# Patient Record
Sex: Male | Born: 1935 | Race: Black or African American | Hispanic: No | Smoking: Never smoker
Health system: Southern US, Community
[De-identification: ages and names within clinical notes are randomized; demographics above are authoritative.]

## PROBLEM LIST (undated history)

## (undated) DIAGNOSIS — M1990 Unspecified osteoarthritis, unspecified site: Secondary | ICD-10-CM

## (undated) DIAGNOSIS — G450 Vertebro-basilar artery syndrome: Secondary | ICD-10-CM

## (undated) DIAGNOSIS — I639 Cerebral infarction, unspecified: Secondary | ICD-10-CM

## (undated) DIAGNOSIS — I472 Ventricular tachycardia: Secondary | ICD-10-CM

## (undated) DIAGNOSIS — R55 Syncope and collapse: Secondary | ICD-10-CM

## (undated) DIAGNOSIS — E785 Hyperlipidemia, unspecified: Secondary | ICD-10-CM

## (undated) DIAGNOSIS — I350 Nonrheumatic aortic (valve) stenosis: Secondary | ICD-10-CM

## (undated) DIAGNOSIS — I4729 Other ventricular tachycardia: Secondary | ICD-10-CM

## (undated) DIAGNOSIS — K529 Noninfective gastroenteritis and colitis, unspecified: Secondary | ICD-10-CM

## (undated) DIAGNOSIS — I610 Nontraumatic intracerebral hemorrhage in hemisphere, subcortical: Secondary | ICD-10-CM

## (undated) DIAGNOSIS — I82409 Acute embolism and thrombosis of unspecified deep veins of unspecified lower extremity: Secondary | ICD-10-CM

## (undated) HISTORY — PX: SHOULDER SURGERY: SHX246

## (undated) HISTORY — PX: LOOP RECORDER IMPLANT: SHX5954

## (undated) HISTORY — DX: Ventricular tachycardia: I47.2

## (undated) HISTORY — PX: HEMORRHOIDECTOMY WITH HEMORRHOID BANDING: SHX5633

## (undated) HISTORY — PX: OTHER SURGICAL HISTORY: SHX169

## (undated) HISTORY — PX: ABDOMINAL SURGERY: SHX537

---

## 2015-11-04 DIAGNOSIS — R55 Syncope and collapse: Secondary | ICD-10-CM | POA: Insufficient documentation

## 2017-08-31 DIAGNOSIS — I639 Cerebral infarction, unspecified: Secondary | ICD-10-CM | POA: Insufficient documentation

## 2018-12-06 DIAGNOSIS — R1013 Epigastric pain: Secondary | ICD-10-CM | POA: Insufficient documentation

## 2019-09-27 DIAGNOSIS — R001 Bradycardia, unspecified: Secondary | ICD-10-CM

## 2019-09-27 HISTORY — DX: Bradycardia, unspecified: R00.1

## 2020-01-10 DIAGNOSIS — Z8673 Personal history of transient ischemic attack (TIA), and cerebral infarction without residual deficits: Secondary | ICD-10-CM | POA: Insufficient documentation

## 2020-01-11 DIAGNOSIS — R7401 Elevation of levels of liver transaminase levels: Secondary | ICD-10-CM | POA: Insufficient documentation

## 2020-01-12 DIAGNOSIS — R29898 Other symptoms and signs involving the musculoskeletal system: Secondary | ICD-10-CM | POA: Insufficient documentation

## 2020-01-12 DIAGNOSIS — I610 Nontraumatic intracerebral hemorrhage in hemisphere, subcortical: Secondary | ICD-10-CM | POA: Insufficient documentation

## 2020-01-26 DIAGNOSIS — E271 Primary adrenocortical insufficiency: Secondary | ICD-10-CM | POA: Insufficient documentation

## 2020-01-26 DIAGNOSIS — R262 Difficulty in walking, not elsewhere classified: Secondary | ICD-10-CM | POA: Insufficient documentation

## 2020-02-18 DIAGNOSIS — I472 Ventricular tachycardia, unspecified: Secondary | ICD-10-CM | POA: Insufficient documentation

## 2020-05-19 ENCOUNTER — Emergency Department (HOSPITAL_COMMUNITY)
Admission: EM | Admit: 2020-05-19 | Discharge: 2020-05-19 | Disposition: A | Discharge status: Home / Self Care | Payer: Medicare Other | Attending: Emergency Medicine | Admitting: Emergency Medicine

## 2020-05-19 ENCOUNTER — Other Ambulatory Visit: Payer: Self-pay

## 2020-05-19 ENCOUNTER — Emergency Department (HOSPITAL_COMMUNITY): Payer: Medicare Other

## 2020-05-19 ENCOUNTER — Encounter (HOSPITAL_COMMUNITY): Payer: Self-pay

## 2020-05-19 DIAGNOSIS — K529 Noninfective gastroenteritis and colitis, unspecified: Secondary | ICD-10-CM

## 2020-05-19 DIAGNOSIS — R7989 Other specified abnormal findings of blood chemistry: Secondary | ICD-10-CM

## 2020-05-19 DIAGNOSIS — IMO0001 Reserved for inherently not codable concepts without codable children: Secondary | ICD-10-CM

## 2020-05-19 DIAGNOSIS — K6289 Other specified diseases of anus and rectum: Secondary | ICD-10-CM | POA: Diagnosis not present

## 2020-05-19 DIAGNOSIS — R102 Pelvic and perineal pain: Secondary | ICD-10-CM | POA: Diagnosis present

## 2020-05-19 HISTORY — DX: Unspecified osteoarthritis, unspecified site: M19.90

## 2020-05-19 LAB — COMPREHENSIVE METABOLIC PANEL
ALT: 54 U/L — ABNORMAL HIGH (ref 0–44)
AST: 46 U/L — ABNORMAL HIGH (ref 15–41)
Albumin: 4.1 g/dL (ref 3.5–5.0)
Alkaline Phosphatase: 145 U/L — ABNORMAL HIGH (ref 38–126)
Anion gap: 12 (ref 5–15)
BUN: 19 mg/dL (ref 8–23)
CO2: 23 mmol/L (ref 22–32)
Calcium: 9.3 mg/dL (ref 8.9–10.3)
Chloride: 104 mmol/L (ref 98–111)
Creatinine, Ser: 0.97 mg/dL (ref 0.61–1.24)
GFR calc Af Amer: >60 mL/min (ref >60)
GFR calc non Af Amer: >60 mL/min (ref >60)
Glucose, Bld: 93 mg/dL (ref 70–99)
Potassium: 4 mmol/L (ref 3.5–5.1)
Sodium: 139 mmol/L (ref 135–145)
Total Bilirubin: 0.8 mg/dL (ref 0.3–1.2)
Total Protein: 7.9 g/dL (ref 6.5–8.1)

## 2020-05-19 LAB — CBC WITH DIFFERENTIAL/PLATELET
Abs Immature Granulocytes: 0.02 10*3/uL (ref 0.00–0.07)
Basophils Absolute: 0.1 10*3/uL (ref 0.0–0.1)
Basophils Relative: 1 %
Eosinophils Absolute: 0.2 10*3/uL (ref 0.0–0.5)
Eosinophils Relative: 2 %
HCT: 46 % (ref 39.0–52.0)
Hemoglobin: 14.6 g/dL (ref 13.0–17.0)
Immature Granulocytes: 0 %
Lymphocytes Relative: 17 %
Lymphs Abs: 1.6 10*3/uL (ref 0.7–4.0)
MCH: 29.8 pg (ref 26.0–34.0)
MCHC: 31.7 g/dL (ref 30.0–36.0)
MCV: 93.9 fL (ref 80.0–100.0)
Monocytes Absolute: 0.8 10*3/uL (ref 0.1–1.0)
Monocytes Relative: 9 %
Neutro Abs: 6.9 10*3/uL (ref 1.7–7.7)
Neutrophils Relative %: 71 %
Platelets: 128 10*3/uL — ABNORMAL LOW (ref 150–400)
RBC: 4.9 MIL/uL (ref 4.22–5.81)
RDW: 14.6 % (ref 11.5–15.5)
WBC: 9.6 10*3/uL (ref 4.0–10.5)
nRBC: 0 % (ref 0.0–0.2)

## 2020-05-19 LAB — LIPASE, BLOOD: Lipase: 67 U/L — ABNORMAL HIGH (ref 11–51)

## 2020-05-19 MED ORDER — AMOXICILLIN-POT CLAVULANATE 875-125 MG PO TABS: 1.0000 | ORAL_TABLET | Freq: Two times a day (BID) | ORAL | 0 refills | Status: AC

## 2020-05-19 MED ORDER — IOHEXOL 300 MG/ML  SOLN
100.0000 mL | Freq: Once | INTRAMUSCULAR | Status: AC | PRN
  Administered 2020-05-19: 100 mL via INTRAVENOUS

## 2020-05-19 NOTE — ED Provider Notes (Signed)
[facility] EMERGENCY DEPARTMENT Provider Note   CSN: [id number] Arrival date & time: 05/19/20  8338     History Chief Complaint  Patient presents with  . Rectal Pain    Charles Morgan is a 86 y.o. male.  Patient with a complaint of rectal pain since yesterday.  Patient states she had some hard bowel movements.  He thinks this is hemorrhoids.  Patient had banding of hemorrhoids in the past from reviewing records.  Appears that patient use does not live in this area.  Not followed by gastroenterology here.  Patient talks about some pelvic pain as well.  But no nausea or vomiting.  Denies any bleeding.  But he said there was like 2 large lumps he thought.        Past Medical History:  Diagnosis Date  . Arthritis     There are no problems to display for this patient.   Past Surgical History:  Procedure Laterality Date  . ABDOMINAL SURGERY    . HEMORRHOIDECTOMY WITH HEMORRHOID BANDING    . LOOP RECORDER IMPLANT    . SHOULDER SURGERY    . thumb surgery         No family history on file.  Social History   Tobacco Use  . Smoking status: Never Smoker  . Smokeless tobacco: Never Used  Substance Use Topics  . Alcohol use: Never  . Drug use: Never    Home Medications Prior to Admission medications   Not on File    Allergies    Patient has no known allergies.  Review of Systems   Review of Systems  Constitutional: Negative for chills and fever.  HENT: Negative for congestion, rhinorrhea and sore throat.   Eyes: Negative for visual disturbance.  Respiratory: Negative for cough and shortness of breath.   Cardiovascular: Negative for chest pain and leg swelling.  Gastrointestinal: Positive for abdominal pain and rectal pain. Negative for blood in stool, diarrhea, nausea and vomiting.  Genitourinary: Negative for dysuria.  Musculoskeletal: Negative for back pain and neck pain.  Skin: Negative for rash.  Neurological: Negative for dizziness, light-headedness and  headaches.  Hematological: Does not bruise/bleed easily.  Psychiatric/Behavioral: Negative for confusion.    Physical Exam Updated Vital Signs BP 130/75 (BP Location: Left Arm)   Pulse 70   Temp 98.6 F (37 C) (Oral)   Resp 18   Ht 1.702 m (5\' 7" )   Wt 65.3 kg   SpO2 100%   BMI 22.55 kg/m   Physical Exam Vitals and nursing note reviewed.  Constitutional:      General: He is not in acute distress.    Appearance: Normal appearance. He is well-developed. He is not ill-appearing.  HENT:     Head: Normocephalic and atraumatic.  Eyes:     Extraocular Movements: Extraocular movements intact.     Conjunctiva/sclera: Conjunctivae normal.     Pupils: Pupils are equal, round, and reactive to light.  Cardiovascular:     Rate and Rhythm: Normal rate and regular rhythm.     Heart sounds: No murmur heard.   Pulmonary:     Effort: Pulmonary effort is normal. No respiratory distress.     Breath sounds: Normal breath sounds.  Abdominal:     Palpations: Abdomen is soft.     Tenderness: There is no abdominal tenderness.  Genitourinary:    Rectum: Guaiac result negative.     Comments: Tenderness to palpation around the perirectal area.  But no induration.  Some old skin  tags.  No thrombosed hemorrhoids.  No prolapsed internal hemorrhoids.  No stool in the rectal vault.  Disorder of a lot of gas.  Not think it is purulent but not certain.  No fissure.  Some degree of tenderness on rectal exam but no palpable mass. Musculoskeletal:        General: Normal range of motion.     Cervical back: Normal range of motion and neck supple.  Skin:    General: Skin is warm and dry.     Capillary Refill: Capillary refill takes less than 2 seconds.  Neurological:     General: No focal deficit present.     Mental Status: He is alert and oriented to person, place, and time.     Cranial Nerves: No cranial nerve deficit.     Sensory: No sensory deficit.     Motor: No weakness.     ED Results /  Procedures / Treatments   Labs (all labs ordered are listed, but only abnormal results are displayed) Labs Reviewed  COMPREHENSIVE METABOLIC PANEL  LIPASE, BLOOD  CBC WITH DIFFERENTIAL/PLATELET    EKG None  Radiology No results found.  Procedures Procedures (including critical care time)  Medications Ordered in ED Medications - No data to display  ED Course  I have reviewed the triage vital signs and the nursing notes.  Pertinent labs & imaging results that were available during my care of the patient were reviewed by me and considered in my medical decision making (see chart for details).    MDM Rules/Calculators/A&P                          Rectal exam without any specific findings.  Raising some concerns of whether there is may be a deeper perirectal abscess.  No evidence of any prolapsed internal hemorrhoids or evidence of any thrombosed external hemorrhoids.  Also at his age always could be concern for rectal carcinoma.  I will go ahead and get labs and get CT abdomen pelvis.  If no specific findings would have patient follow-up locally with GI medicine.     Final Clinical Impression(s) / ED Diagnoses Final diagnoses:  Rectal pain    Rx / DC Orders ED Discharge Orders    None       Vanetta Mulders, MD 05/19/20 1304

## 2020-05-19 NOTE — ED Provider Notes (Signed)
Blood pressure 130/75, pulse 70, temperature 98.6 F (37 C), temperature source Oral, resp. rate 18, height 5\' 7"  (1.702 m), weight 65.3 kg, SpO2 100 %.  Assuming care from Dr.  In short, Charles Morgan is a 86 y.o. male with a chief complaint of Rectal Pain .  Refer to the original H&P for additional details.  The current plan of care is to f/u on CT abdomen/pelvis and reassess. Labs reviewed and largely unremarkable.   04:05 PM  CT imaging reviewed along with interpretation by radiology.  Patient has evidence of mild proctocolitis on his CT which correlates with his symptoms.  Plan for antibiotics and GI follow-up.  The area noted over the buttock is not causing pain and is not consistent with abscess.  Patient also with dilated common bile duct but no anterior abdominal pain.  He has very mild elevation in his LFTs and per radiology recommendation will mention this to him and have him also discussed with gastroenterology regarding outpatient MRCP PRN.  Patient also has a pulmonary nodule found on CT.  Will mention this and he will follow with his primary care doctor to decide on the need for further or follow-up imaging. Discussed ED results, plan, and return precautions in detail. Patient feels comfortable with the plan at discharge.     97, MD 05/19/20 [phone]

## 2020-05-19 NOTE — Discharge Instructions (Signed)
You were seen in the emergency department today with rectal pain.  You do have some inflammation in your rectum and colon area and I am starting you on antibiotics.  Please take the antibiotics over the next 7 days.  I have also listed the name of a gastroenterologist on this paperwork.  I would like for you to call their office and schedule the next available follow-up appointment.  They need to evaluate your rectal pain and decide on further imaging/procedures as needed such as colonoscopy.  You also have slightly elevated liver enzymes and a slightly dilated common bile duct.  This may require further testing such as an MRCP and they can discuss this with you further.    You also have a small nodule in your lung.  Please discuss with your primary care doctor to decide on further testing or follow-up imaging.   If you develop fever, worsening pain, vomiting, pain in the front of your abdomen, or worsening pain with eating you should return to the emergency department immediately.

## 2020-05-19 NOTE — ED Triage Notes (Signed)
Pt reports pain from hemorrhoids since yesterday.  Reports has had hemorrhoidectomy x 2 in the past.  Reports stool has been hard recently and has had pain since yesterday.

## 2020-06-04 ENCOUNTER — Observation Stay (HOSPITAL_COMMUNITY): Payer: Medicare HMO

## 2020-06-04 ENCOUNTER — Other Ambulatory Visit: Payer: Self-pay

## 2020-06-04 ENCOUNTER — Emergency Department (HOSPITAL_COMMUNITY): Payer: Medicare HMO

## 2020-06-04 ENCOUNTER — Encounter (HOSPITAL_COMMUNITY): Payer: Self-pay

## 2020-06-04 ENCOUNTER — Observation Stay (HOSPITAL_COMMUNITY)
Admission: EM | Admit: 2020-06-04 | Discharge: 2020-06-06 | Disposition: A | Discharge status: Home / Self Care | Payer: Medicare HMO | Attending: Internal Medicine | Admitting: Internal Medicine

## 2020-06-04 DIAGNOSIS — Z7982 Long term (current) use of aspirin: Secondary | ICD-10-CM | POA: Insufficient documentation

## 2020-06-04 DIAGNOSIS — I359 Nonrheumatic aortic valve disorder, unspecified: Secondary | ICD-10-CM | POA: Diagnosis present

## 2020-06-04 DIAGNOSIS — R933 Abnormal findings on diagnostic imaging of other parts of digestive tract: Secondary | ICD-10-CM | POA: Diagnosis not present

## 2020-06-04 DIAGNOSIS — I651 Occlusion and stenosis of basilar artery: Secondary | ICD-10-CM

## 2020-06-04 DIAGNOSIS — K648 Other hemorrhoids: Secondary | ICD-10-CM | POA: Diagnosis not present

## 2020-06-04 DIAGNOSIS — R2689 Other abnormalities of gait and mobility: Secondary | ICD-10-CM | POA: Insufficient documentation

## 2020-06-04 DIAGNOSIS — R2681 Unsteadiness on feet: Secondary | ICD-10-CM | POA: Insufficient documentation

## 2020-06-04 DIAGNOSIS — R7989 Other specified abnormal findings of blood chemistry: Secondary | ICD-10-CM

## 2020-06-04 DIAGNOSIS — R55 Syncope and collapse: Secondary | ICD-10-CM | POA: Diagnosis present

## 2020-06-04 DIAGNOSIS — D696 Thrombocytopenia, unspecified: Secondary | ICD-10-CM

## 2020-06-04 DIAGNOSIS — Z20822 Contact with and (suspected) exposure to covid-19: Secondary | ICD-10-CM | POA: Insufficient documentation

## 2020-06-04 DIAGNOSIS — R109 Unspecified abdominal pain: Secondary | ICD-10-CM

## 2020-06-04 DIAGNOSIS — N179 Acute kidney failure, unspecified: Secondary | ICD-10-CM | POA: Diagnosis not present

## 2020-06-04 DIAGNOSIS — Z79899 Other long term (current) drug therapy: Secondary | ICD-10-CM | POA: Insufficient documentation

## 2020-06-04 DIAGNOSIS — Z8673 Personal history of transient ischemic attack (TIA), and cerebral infarction without residual deficits: Secondary | ICD-10-CM | POA: Diagnosis not present

## 2020-06-04 DIAGNOSIS — E785 Hyperlipidemia, unspecified: Secondary | ICD-10-CM | POA: Diagnosis not present

## 2020-06-04 DIAGNOSIS — N289 Disorder of kidney and ureter, unspecified: Secondary | ICD-10-CM

## 2020-06-04 DIAGNOSIS — M6281 Muscle weakness (generalized): Secondary | ICD-10-CM | POA: Diagnosis not present

## 2020-06-04 DIAGNOSIS — K5730 Diverticulosis of large intestine without perforation or abscess without bleeding: Principal | ICD-10-CM | POA: Insufficient documentation

## 2020-06-04 DIAGNOSIS — E860 Dehydration: Secondary | ICD-10-CM | POA: Diagnosis not present

## 2020-06-04 DIAGNOSIS — G459 Transient cerebral ischemic attack, unspecified: Secondary | ICD-10-CM

## 2020-06-04 DIAGNOSIS — N183 Chronic kidney disease, stage 3 unspecified: Secondary | ICD-10-CM | POA: Diagnosis not present

## 2020-06-04 DIAGNOSIS — R7401 Elevation of levels of liver transaminase levels: Secondary | ICD-10-CM

## 2020-06-04 HISTORY — DX: Syncope and collapse: R55

## 2020-06-04 HISTORY — DX: Hyperlipidemia, unspecified: E78.5

## 2020-06-04 HISTORY — DX: Vertebro-basilar artery syndrome: G45.0

## 2020-06-04 HISTORY — DX: Nontraumatic intracerebral hemorrhage in hemisphere, subcortical: I61.0

## 2020-06-04 HISTORY — DX: Cerebral infarction, unspecified: I63.9

## 2020-06-04 HISTORY — DX: Other ventricular tachycardia: I47.29

## 2020-06-04 HISTORY — DX: Acute embolism and thrombosis of unspecified deep veins of unspecified lower extremity: I82.409

## 2020-06-04 HISTORY — DX: Noninfective gastroenteritis and colitis, unspecified: K52.9

## 2020-06-04 HISTORY — DX: Nonrheumatic aortic (valve) stenosis: I35.0

## 2020-06-04 LAB — COMPREHENSIVE METABOLIC PANEL
ALT: 308 U/L — ABNORMAL HIGH (ref 0–44)
AST: 269 U/L — ABNORMAL HIGH (ref 15–41)
Albumin: 3.5 g/dL (ref 3.5–5.0)
Alkaline Phosphatase: 479 U/L — ABNORMAL HIGH (ref 38–126)
Anion gap: 7 (ref 5–15)
BUN: 16 mg/dL (ref 8–23)
CO2: 25 mmol/L (ref 22–32)
Calcium: 9.1 mg/dL (ref 8.9–10.3)
Chloride: 106 mmol/L (ref 98–111)
Creatinine, Ser: 1.28 mg/dL — ABNORMAL HIGH (ref 0.61–1.24)
GFR calc Af Amer: 58 mL/min — ABNORMAL LOW (ref >60)
GFR calc non Af Amer: 50 mL/min — ABNORMAL LOW (ref >60)
Glucose, Bld: 127 mg/dL — ABNORMAL HIGH (ref 70–99)
Potassium: 4.1 mmol/L (ref 3.5–5.1)
Sodium: 138 mmol/L (ref 135–145)
Total Bilirubin: 0.8 mg/dL (ref 0.3–1.2)
Total Protein: 7.1 g/dL (ref 6.5–8.1)

## 2020-06-04 LAB — DIFFERENTIAL
Abs Immature Granulocytes: 0.02 10*3/uL (ref 0.00–0.07)
Basophils Absolute: 0.1 10*3/uL (ref 0.0–0.1)
Basophils Relative: 1 %
Eosinophils Absolute: 0.4 10*3/uL (ref 0.0–0.5)
Eosinophils Relative: 6 %
Immature Granulocytes: 0 %
Lymphocytes Relative: 21 %
Lymphs Abs: 1.5 10*3/uL (ref 0.7–4.0)
Monocytes Absolute: 0.6 10*3/uL (ref 0.1–1.0)
Monocytes Relative: 8 %
Neutro Abs: 4.3 10*3/uL (ref 1.7–7.7)
Neutrophils Relative %: 64 %

## 2020-06-04 LAB — URINALYSIS, ROUTINE W REFLEX MICROSCOPIC
Bilirubin Urine: NEGATIVE
Glucose, UA: NEGATIVE mg/dL
Hgb urine dipstick: NEGATIVE
Ketones, ur: NEGATIVE mg/dL
Leukocytes,Ua: NEGATIVE
Nitrite: NEGATIVE
Protein, ur: NEGATIVE mg/dL
Specific Gravity, Urine: 1.01 (ref 1.005–1.030)
pH: 5 (ref 5.0–8.0)

## 2020-06-04 LAB — RAPID URINE DRUG SCREEN, HOSP PERFORMED
Amphetamines: NOT DETECTED
Barbiturates: NOT DETECTED
Benzodiazepines: NOT DETECTED
Cocaine: NOT DETECTED
Opiates: NOT DETECTED
Tetrahydrocannabinol: NOT DETECTED

## 2020-06-04 LAB — I-STAT CHEM 8, ED
BUN: 19 mg/dL (ref 8–23)
Calcium, Ion: 1.1 mmol/L — ABNORMAL LOW (ref 1.15–1.40)
Chloride: 107 mmol/L (ref 98–111)
Creatinine, Ser: 1.5 mg/dL — ABNORMAL HIGH (ref 0.61–1.24)
Glucose, Bld: 131 mg/dL — ABNORMAL HIGH (ref 70–99)
HCT: 43 % (ref 39.0–52.0)
Hemoglobin: 14.6 g/dL (ref 13.0–17.0)
Potassium: 5 mmol/L (ref 3.5–5.1)
Sodium: 140 mmol/L (ref 135–145)
TCO2: 24 mmol/L (ref 22–32)

## 2020-06-04 LAB — ETHANOL: Alcohol, Ethyl (B): <10 mg/dL (ref <10)

## 2020-06-04 LAB — APTT: aPTT: 25 seconds (ref 24–36)

## 2020-06-04 LAB — CBC
HCT: 43.9 % (ref 39.0–52.0)
Hemoglobin: 14.1 g/dL (ref 13.0–17.0)
MCH: 29.9 pg (ref 26.0–34.0)
MCHC: 32.1 g/dL (ref 30.0–36.0)
MCV: 93 fL (ref 80.0–100.0)
Platelets: 126 10*3/uL — ABNORMAL LOW (ref 150–400)
RBC: 4.72 MIL/uL (ref 4.22–5.81)
RDW: 14.6 % (ref 11.5–15.5)
WBC: 6.8 10*3/uL (ref 4.0–10.5)
nRBC: 0 % (ref 0.0–0.2)

## 2020-06-04 LAB — HEMOGLOBIN A1C
Hgb A1c MFr Bld: 6.2 % — ABNORMAL HIGH (ref 4.8–5.6)
Mean Plasma Glucose: 131.24 mg/dL

## 2020-06-04 LAB — MAGNESIUM: Magnesium: 2 mg/dL (ref 1.7–2.4)

## 2020-06-04 LAB — SALICYLATE LEVEL: Salicylate Lvl: <7 mg/dL — ABNORMAL LOW (ref 7.0–30.0)

## 2020-06-04 LAB — ACETAMINOPHEN LEVEL: Acetaminophen (Tylenol), Serum: <10 ug/mL — ABNORMAL LOW (ref 10–30)

## 2020-06-04 LAB — CK: Total CK: 171 U/L (ref 49–397)

## 2020-06-04 LAB — SARS CORONAVIRUS 2 BY RT PCR (HOSPITAL ORDER, PERFORMED IN ~~LOC~~ HOSPITAL LAB): SARS Coronavirus 2: NEGATIVE

## 2020-06-04 LAB — PROTIME-INR
INR: 1 (ref 0.8–1.2)
Prothrombin Time: 12.8 seconds (ref 11.4–15.2)

## 2020-06-04 MED ORDER — ASPIRIN EC 81 MG PO TBEC
81.0000 mg | DELAYED_RELEASE_TABLET | Freq: Every day | ORAL | Status: DC
  Administered 2020-06-04: 81 mg via ORAL
  Filled 2020-06-04: qty 1

## 2020-06-04 MED ORDER — SODIUM CHLORIDE 0.9 % IV BOLUS
500.0000 mL | Freq: Once | INTRAVENOUS | Status: AC
  Administered 2020-06-04: 500 mL via INTRAVENOUS

## 2020-06-04 MED ORDER — MIDODRINE HCL 5 MG PO TABS
5.0000 mg | ORAL_TABLET | Freq: Two times a day (BID) | ORAL | Status: DC
  Administered 2020-06-04 – 2020-06-06 (×4): 5 mg via ORAL
  Filled 2020-06-04 (×5): qty 1

## 2020-06-04 MED ORDER — DOCUSATE SODIUM 100 MG PO CAPS
100.0000 mg | ORAL_CAPSULE | Freq: Two times a day (BID) | ORAL | Status: DC
  Administered 2020-06-04 – 2020-06-06 (×3): 100 mg via ORAL
  Filled 2020-06-04 (×4): qty 1

## 2020-06-04 MED ORDER — PANTOPRAZOLE SODIUM 40 MG PO TBEC
40.0000 mg | DELAYED_RELEASE_TABLET | Freq: Every day | ORAL | Status: DC
  Administered 2020-06-04 – 2020-06-06 (×3): 40 mg via ORAL
  Filled 2020-06-04 (×3): qty 1

## 2020-06-04 MED ORDER — SODIUM CHLORIDE 0.9% FLUSH
3.0000 mL | Freq: Two times a day (BID) | INTRAVENOUS | Status: DC
  Administered 2020-06-05 – 2020-06-06 (×2): 3 mL via INTRAVENOUS

## 2020-06-04 MED ORDER — SODIUM CHLORIDE 0.45 % IV SOLN: INTRAVENOUS | Status: DC

## 2020-06-04 MED ORDER — ATORVASTATIN CALCIUM 40 MG PO TABS
80.0000 mg | ORAL_TABLET | Freq: Every day | ORAL | Status: DC
  Administered 2020-06-04: 80 mg via ORAL
  Filled 2020-06-04: qty 2

## 2020-06-04 MED ORDER — ACETAMINOPHEN 325 MG PO TABS: 650.0000 mg | ORAL_TABLET | Freq: Four times a day (QID) | ORAL | Status: DC | PRN

## 2020-06-04 MED ORDER — ENOXAPARIN SODIUM 40 MG/0.4ML ~~LOC~~ SOLN
40.0000 mg | SUBCUTANEOUS | Status: DC
  Administered 2020-06-04 – 2020-06-05 (×2): 40 mg via SUBCUTANEOUS
  Filled 2020-06-04 (×2): qty 0.4

## 2020-06-04 MED ORDER — ACETAMINOPHEN 650 MG RE SUPP: 650.0000 mg | Freq: Four times a day (QID) | RECTAL | Status: DC | PRN

## 2020-06-04 NOTE — ED Notes (Signed)
Pt verbalized taking 2 covid vaccines

## 2020-06-04 NOTE — H&P (Signed)
History and Physical    Kohler Pellerito BWG:[id number] DOB: May 14, 1930 DOA: 06/04/2020  PCP: Patient, No Pcp Per (Confirm with patient/family/NH records and if not entered, this has to be entered at [facility] point of entry) Patient coming from: home  I have personally briefly reviewed patient's old medical records in [facility]  Chief Complaint: loss of consciousness  HPI: Charles Morgan is a 86 y.o. male with medical history significant of vertebrobasilar insufficiency, multiple episodes of syncope, h/o CVAs, AoV Calcification/stenosis, hyperlipidemia. Patient lives in [place]. Was able to access and review [facility] records, [place]. Patient has had increasing debility with gait disturbance and need for assistance with ADLs to the point where his long estranged daughter has reengaged and has become his primary care giver. They are in [place] visiting family with plans to move here from [place]. On the day of admission his daughter found him unconscious and unresponsive for several minutes until EMS arrived. He was transported to [facility] for further evaluation  ED Course: T 97.5  137/68  62  16. Patient was responsive and had no complaint. Exam per EDP was non-focal. Lab revealed mild elevation Cr to 1.4 which improved with IVF to 1.2, mild elevation in LFTs. CT head negative for any acute changes. [facility] called to admit patient for observation and continued evaluation.  Review of Systems: As per HPI otherwise 10 point review of systems negative.    Past Medical History:  Diagnosis Date  . Arthritis   . Bradycardia, sinus 2021   nocturnal, asymptomatic. Had cardiology eval  . CVA (cerebral vascular accident) [facility])    two events: 2013, 2019  . DVT (deep venous thrombosis) (HCC) Aug '20   right LE  . HLD (hyperlipidemia)   . Proctocolitis without complication Aug '21   CT diagnosis  . Syncope and collapse    multiple events. Neuro eval - no w/u needed w/ dx VB Insufficiency  . 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 Neurology eval - no need for further w/u  . V-tach [facility])    multiple episodes non-sustained V tach. Had Cardiology eval  . Vertebrobasilar artery insufficiency     Past Surgical History:  Procedure Laterality Date  . ABDOMINAL SURGERY    . HEMORRHOIDECTOMY WITH HEMORRHOID BANDING    . LOOP RECORDER IMPLANT    . SHOULDER SURGERY    . 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 Married twice: #1 16 years - divorced, #2 still married but seperated for many years. He had 7 children, 6 granchildren, 13 greatgrand children, 2 great-greatgrand children. 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 Retired due to progress OA and decreased use of his hands. Is reuniting with his family.   reports that he has never smoked. He has never used smokeless tobacco. He reports that he does not drink alcohol and does not use drugs.  Allergies  Allergen Reactions  . Tuberculin Rash    No family history on file. At 89 - noncontributory   Prior to Admission medications   Medication Sig Start Date End Date Taking? Authorizing Provider  aspirin EC 81 MG tablet Take 81 mg by mouth daily. Swallow whole.    [provider]  atorvastatin (LIPITOR) 80 MG tablet Take 1 tablet by mouth in the morning and at bedtime.  01/08/20   [provider]  docusate sodium (COLACE) 100 MG capsule Take 100 mg by mouth 2 (two) times daily.    [provider]    Physical Exam: Vitals:   06/04/20 1245 06/04/20 1300 06/04/20 1315 06/04/20 1330  BP:  137/68    Pulse: (!) 55 (!) 52 (!) 52 62  Resp: (!) 22 (!) 23 18 16   Temp:      TempSrc:      SpO2: 99% 100% 100% 100%  Weight:      Height:         Vitals:   06/04/20 1245 06/04/20 1300 06/04/20 1315 06/04/20 1330  BP:  137/68    Pulse: (!)  55 (!) 52 (!) 52 62  Resp: (!) 22 (!) 23 18 16   Temp:      TempSrc:      SpO2: 99% 100% 100% 100%  Weight:      Height:       General: elderly man in no distress. Cooperative and articulate. Eyes: Arcus senilis bilaterally, PERRL, lids and conjunctivae normal ENMT: Mucous membranes are moist. Posterior pharynx clear of any exudate or lesions. Edentulous with two remaining teeth mandible.  Neck: normal, supple, no masses, no thyromegaly Respiratory: clear to auscultation bilaterally, no wheezing, no crackles. Normal respiratory effort. No accessory muscle use.  Cardiovascular: Regular bradycardia with bigeminy and trigeminy, II/VI murmur best at LSB. No rubs / gallops. No extremity edema. 1+ pedal pulses. No carotid bruits.  Abdomen:  Protuberant,  no tenderness to percussion or deep palpation, no masses palpated. No hepatosplenomegaly. Bowel sounds positive.  Musculoskeletal: no clubbing / cyanosis. No erythema or calore joints of hands, knees. Has hammer toe deformity 2nd toe left with tenderness.  Good ROM, no contractures. Decreased muscle tone.  Skin: no rashes, lesions, ulcers. No induration. Mild excoriation diffusely 2/2 dry skin Neurologic: CN 2-12 nl facial symmetry and movement, PERRLA, EOMI, no deviation of the tongue, nl shoulder shrug. Sensation intact to light touch. Strength 4/5 in all 4 able to move against gravity, offers some resistance.  Psychiatric: Normal judgment and insight. Alert and oriented x 3. Normal mood.     Labs on Admission: I have personally reviewed following labs and imaging studies  CBC: Recent Labs  Lab 06/04/20 1248 06/04/20 1257  WBC  --  6.8  NEUTROABS  --  4.3  HGB 14.6 14.1  HCT 43.0 43.9  MCV  --  93.0  PLT  --  126*   Basic Metabolic Panel: Recent Labs  Lab 06/04/20 1248 06/04/20 1257  NA 140 138  K 5.0 4.1  CL 107 106  CO2  --  25  GLUCOSE 131* 127*  BUN 19 16  CREATININE 1.50* 1.28*  CALCIUM  --  9.1  MG  --  2.0    GFR: Estimated Creatinine Clearance: 37.6 mL/min (A) (by C-G formula based on SCr of 1.28 mg/dL (H)). Liver Function Tests: Recent Labs  Lab 06/04/20 1257  AST 269*  ALT 308*  ALKPHOS 479*  BILITOT 0.8  PROT 7.1  ALBUMIN 3.5   No results for input(s): LIPASE, AMYLASE in the last 168 hours. No results for input(s): AMMONIA in the last 168 hours. Coagulation Profile: Recent Labs  Lab 06/04/20 1257  INR 1.0   Cardiac Enzymes: Recent Labs  Lab 06/04/20 1257  CKTOTAL 171   BNP (last 3 results) No results for input(s): PROBNP in the last 8760 hours. HbA1C: No results for input(s): HGBA1C in the  last 72 hours. CBG: No results for input(s): GLUCAP in the last 168 hours. Lipid Profile: No results for input(s): CHOL, HDL, LDLCALC, TRIG, CHOLHDL, LDLDIRECT in the last 72 hours. Thyroid Function Tests: No results for input(s): TSH, T4TOTAL, FREET4, T3FREE, THYROIDAB in the last 72 hours. Anemia Panel: No results for input(s): VITAMINB12, FOLATE, FERRITIN, TIBC, IRON, RETICCTPCT in the last 72 hours. Urine analysis:    Component Value Date/Time   COLORURINE YELLOW 06/04/2020 1436   APPEARANCEUR HAZY (A) 06/04/2020 1436   LABSPEC 1.010 06/04/2020 1436   PHURINE 5.0 06/04/2020 1436   GLUCOSEU NEGATIVE 06/04/2020 1436   HGBUR NEGATIVE 06/04/2020 1436   BILIRUBINUR NEGATIVE 06/04/2020 1436   KETONESUR NEGATIVE 06/04/2020 1436   PROTEINUR NEGATIVE 06/04/2020 1436   NITRITE NEGATIVE 06/04/2020 1436   LEUKOCYTESUR NEGATIVE 06/04/2020 1436    Radiological Exams on Admission: CT HEAD WO CONTRAST  Result Date: 06/04/2020 CLINICAL DATA:  TIA symptoms with weakness EXAM: CT HEAD WITHOUT CONTRAST TECHNIQUE: Contiguous axial images were obtained from the base of the skull through the vertex without intravenous contrast. COMPARISON:  None FINDINGS: Brain: There is moderate diffuse atrophy, stable. There is no intracranial mass, hemorrhage, extra-axial fluid collection, or midline  shift. There is patchy small vessel disease in the centra semiovale bilaterally. There is evidence of a prior infarct in the posterior mid left cerebellum. There is a second prior infarct laterally in the mid to upper right cerebellum. Small vessel disease is noted in each lateral thalamus. No acute infarct is evident on this study. Vascular: No hyperdense vessel. There is calcification in each carotid siphon region. Skull: The bony calvarium appears intact. Sinuses/Orbits: There is mucosal thickening involving several ethmoid air cells. Other visualized paranasal sinuses are clear. Visualized orbits appear symmetric bilaterally. Other: There is opacification in several mastoid air cells bilaterally. There is debris in the left external auditory canal. IMPRESSION: Moderate atrophy with supratentorial small vessel disease. Prior infarcts in each cerebellum, chronic. No acute infarct demonstrable. No mass or hemorrhage. There are foci of arterial vascular calcification bilaterally. Mucosal thickening noted in several ethmoid air cells. Opacification in several mastoid air cells bilaterally. Probable cerumen in left external auditory canal. Electronically Signed   By: Bretta BangWilliam  Woodruff III M.D.   On: 06/04/2020 13:16    EKG: Independently reviewed. Sinus bradycardia, T wave inversion Leads V3-V5  Assessment/Plan Active Problems:   Syncope   Dehydration   AKI (acute kidney injury) (HCC)   HLD (hyperlipidemia)   Calcification of aortic valve   Syncope and collapse  (please populate well all problems here in Problem List. (For example, if patient is on BP meds at home and you resume or decide to hold them, it is a problem that needs to be her. Same for CAD, COPD, HLD and so on)   1. Syncope - patient has had many episodes of syncope and several hospitalizations in the last 6 months with evaluations by cardiology and neurology. Findings included nocturnal bradycardia which cardiology did not think was  causative. Old, chronic thalamic hemorrhage on MRI which did not require further workup. He is currently wearing a loop recorder placed by cardiology in [place] as part of continued syncope work up. He has had MRI. He has had 2D echo January 07, 2020 notable for nl EF, mild AoV stenosis 2/2 calcification. 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 He had previously been prescribed Midodrine 5 mg bid.  Plan Telemetry  Observation  Resume midorine 5 mg bid  2. Dehydration with  mild AKI- patient with initial Cr 1.4 which with hydration improved to 1.2 Plan IVF 1/2 NS at 50cc/hr x 12hrs  F/u Bmet3.   3. HLD - continue storvastatin  4. Neuro - h/o CVA x 2, 2013, 2019. He at one time had dysarthria and gait instability, chronic facial droop. Plan Continue ASA  PT/OT eval  5. GI - h/o heavy EtOH use but quit 3 years ago. Has had elevated transaminases at previous hospitalization. Imaging studies at outside hospital in [place] did revealed dilated CBD and GI f/u and possible MRCP was recommended but hasn't happened. He reports exploratory laparotomy in the past for GB disease which was unrevealing. Denies any h/o pancreatitis. Has had no jaundice, no weight loss. Plan PPI  GI follow up as outpatient  6. Cardiology - see #1. Patient does have bradycardia with frequent pauses. He is wearing a loop recorder.  Plan Tele obs  Outpatient f/u with cardiology  7. H/o DVT - had Right LE DVT in August 2020. Was to have completed 90 days of Xarelto. No leg pain or swelling. No respiratory symptoms Plan Continue ASA  8. Elevated serum glucose - patient denies any h/o diabetes. Plan A1C with recommendations to follow  9. Code status - verified DNR status with patient. He has a DNR in [place]  10. Disposition - his daughter has been primary care given and went to [place] to care for him. She states they are moving to [place] where they have more family. She is interested in establishing HCPOA. Plan  TOC  consult  DVT prophylaxis: lovenox  Code Status: DNR  Family Communication: spoke with Charles Morgan, patient's daughter. She understands Tx plan and agrees.   Disposition Plan: home 24-48 hours Consults called: none  Admission status: telemetry observation    Illene Regulus MD [facility] Pager [phone]  If 7PM-7AM, please contact night-coverage [email or web address] Password [email or web address]  06/04/2020, 4:15 PM

## 2020-06-04 NOTE — ED Triage Notes (Signed)
EMS reports pt's lkw was 1130.  Reports family found him slumped over in walker chair unresponsive approx ago.  EMS says when they arrived they noticed some left sided weakness, pt was awake and alert.  Reports enroute pt has become more responsive and left sided weakness improved.  PT answering questions, moves all extremities.

## 2020-06-04 NOTE — Discharge Instructions (Signed)
Return to the emergency room if you develop new or worsening symptoms or you pass out again.  If you change your mind and would like reassessment for possible admission as we discussed. Stay well-hydrated.

## 2020-06-04 NOTE — ED Provider Notes (Signed)
[facility] EMERGENCY DEPARTMENT Provider Note   CSN: [id number] Arrival date & time: 06/04/20  1206     History Chief Complaint  Patient presents with  . Loss of Consciousness    Charles Morgan is a 86 y.o. male.  Patient presents after being found by family slumped forward, syncopal type event.  Patient does not recall details.  Patient felt tired however feels back to normal now.  Per report he was weaker on his left side however patient does not recall this.  Patient denies any significant headache, no shortness of breath, no chest pain, no fevers or chills.  Patient has a loop recorder in.  Patient is visiting from [place].  No cigarette smoking.        Past Medical History:  Diagnosis Date  . Arthritis     There are no problems to display for this patient.   Past Surgical History:  Procedure Laterality Date  . ABDOMINAL SURGERY    . HEMORRHOIDECTOMY WITH HEMORRHOID BANDING    . LOOP RECORDER IMPLANT    . SHOULDER SURGERY    . thumb surgery         No family history on file.  Social History   Tobacco Use  . Smoking status: Never Smoker  . Smokeless tobacco: Never Used  Substance Use Topics  . Alcohol use: Never  . Drug use: Never    Home Medications Prior to Admission medications   Medication Sig Start Date End Date Taking? Authorizing Provider  aspirin EC 81 MG tablet Take 81 mg by mouth daily. Swallow whole.    [provider]  atorvastatin (LIPITOR) 80 MG tablet Take 1 tablet by mouth in the morning and at bedtime. 01/08/20   [provider]  docusate sodium (COLACE) 100 MG capsule Take 100 mg by mouth 2 (two) times daily.    [provider]    Allergies    Tuberculin  Review of Systems   Review of Systems  Physical Exam Updated Vital Signs BP 137/68   Pulse 62   Temp (!) 97.5 F (36.4 C) (Oral)   Resp 16   Ht 5\' 7"  (1.702 m)   Wt 65.3 kg   SpO2 100%   BMI 22.55 kg/m   Physical Exam Vitals and nursing  note reviewed.  Constitutional:      Appearance: He is well-developed.  HENT:     Head: Normocephalic and atraumatic.     Mouth/Throat:     Mouth: Mucous membranes are dry.  Eyes:     General:        Right eye: No discharge.        Left eye: No discharge.     Conjunctiva/sclera: Conjunctivae normal.  Neck:     Trachea: No tracheal deviation.  Cardiovascular:     Rate and Rhythm: Normal rate and regular rhythm.     Heart sounds: Murmur (SM 2+ upper sternum) heard.   Pulmonary:     Effort: Pulmonary effort is normal.     Breath sounds: Normal breath sounds.  Abdominal:     General: There is no distension.     Palpations: Abdomen is soft.     Tenderness: There is no abdominal tenderness. There is no guarding.  Musculoskeletal:        General: Normal range of motion.     Cervical back: Normal range of motion and neck supple.  Skin:    General: Skin is warm.     Findings: No rash.  Neurological:     Mental Status: He is alert and oriented to person, place, and time.  Psychiatric:        Mood and Affect: Mood normal.     ED Results / Procedures / Treatments   Labs (all labs ordered are listed, but only abnormal results are displayed) Labs Reviewed  CBC - Abnormal; Notable for the following components:      Result Value   Platelets 126 (*)    All other components within normal limits  COMPREHENSIVE METABOLIC PANEL - Abnormal; Notable for the following components:   Glucose, Bld 127 (*)    Creatinine, Ser 1.28 (*)    AST 269 (*)    ALT 308 (*)    Alkaline Phosphatase 479 (*)    GFR calc non Af Amer 50 (*)    GFR calc Af Amer 58 (*)    All other components within normal limits  I-STAT CHEM 8, ED - Abnormal; Notable for the following components:   Creatinine, Ser 1.50 (*)    Glucose, Bld 131 (*)    Calcium, Ion 1.10 (*)    All other components within normal limits  SARS CORONAVIRUS 2 BY RT PCR (HOSPITAL ORDER, PERFORMED IN [facility])  ETHANOL   PROTIME-INR  APTT  DIFFERENTIAL  MAGNESIUM  RAPID URINE DRUG SCREEN, HOSP PERFORMED  URINALYSIS, ROUTINE W REFLEX MICROSCOPIC  CK  SALICYLATE LEVEL  ACETAMINOPHEN LEVEL    EKG EKG Interpretation  Date/Time:  Thursday June 04 2020 12:12:36 EDT Ventricular Rate:  53 PR Interval:    QRS Duration: 124 QT Interval:  440 QTC Calculation: 414 R Axis:   60 Text Interpretation: Sinus rhythm Nonspecific intraventricular conduction delay Abnormal T, consider ischemia, diffuse leads Confirmed by Blane Ohara [phone]) on 06/04/2020 12:16:58 PM   Radiology CT HEAD WO CONTRAST  Result Date: 06/04/2020 CLINICAL DATA:  TIA symptoms with weakness EXAM: CT HEAD WITHOUT CONTRAST TECHNIQUE: Contiguous axial images were obtained from the base of the skull through the vertex without intravenous contrast. COMPARISON:  None FINDINGS: Brain: There is moderate diffuse atrophy, stable. There is no intracranial mass, hemorrhage, extra-axial fluid collection, or midline shift. There is patchy small vessel disease in the centra semiovale bilaterally. There is evidence of a prior infarct in the posterior mid left cerebellum. There is a second prior infarct laterally in the mid to upper right cerebellum. Small vessel disease is noted in each lateral thalamus. No acute infarct is evident on this study. Vascular: No hyperdense vessel. There is calcification in each carotid siphon region. Skull: The bony calvarium appears intact. Sinuses/Orbits: There is mucosal thickening involving several ethmoid air cells. Other visualized paranasal sinuses are clear. Visualized orbits appear symmetric bilaterally. Other: There is opacification in several mastoid air cells bilaterally. There is debris in the left external auditory canal. IMPRESSION: Moderate atrophy with supratentorial small vessel disease. Prior infarcts in each cerebellum, chronic. No acute infarct demonstrable. No mass or hemorrhage. There are foci of arterial  vascular calcification bilaterally. Mucosal thickening noted in several ethmoid air cells. Opacification in several mastoid air cells bilaterally. Probable cerumen in left external auditory canal. Electronically Signed   By: Bretta Bang III M.D.   On: 06/04/2020 13:16    Procedures Procedures (including critical care time)  Medications Ordered in ED Medications  sodium chloride 0.9 % bolus 500 mL (0 mLs Intravenous Stopped 06/04/20 1435)    ED Course  I have reviewed the triage vital signs and the nursing notes.  Pertinent labs & imaging results that were available during my care of the patient were reviewed by me and considered in my medical decision making (see chart for details).    MDM Rules/Calculators/A&P                          Patient presents with EMS after syncopal event and possible stroke.  Patient was found slumped forward event lasting minutes at a time.  Patient is back to baseline and feels improved here in the ER.  Differential broad including stroke/TIA, cardiac arrhythmia, dehydration, electrolytes, anemia.  Patient has had COVID vaccines.  On reassessment patient overall feels well and prefers to go home.  Patient does have a cardiac murmur and unsure specific history of this and with age she is high risk after syncopal event.  I do feel he should be observed further.  LFTs returned elevated to 69 and 308 respectively, kidney function up 1.5.  IV fluid bolus given.  Normal hemoglobin, normal glucose 131. Plan for oral fluids, continued monitoring and observation in the ER/hospital if patient will agree. Unable to get MRI due to loop recorder and unknown details. Urinalysis pending.  Patient will be signed out to continue to monitor for observation in the hospital.  Pt agreed to admission. Spoke with hospitalist.   Final Clinical Impression(s) / ED Diagnoses Final diagnoses:  Syncope and collapse  LFT elevation  Acute renal insufficiency  TIA (transient  ischemic attack)    Rx / DC Orders ED Discharge Orders    None       Blane Ohara, MD 06/04/20 1531

## 2020-06-05 ENCOUNTER — Observation Stay (HOSPITAL_COMMUNITY)
Admit: 2020-06-05 | Discharge: 2020-06-05 | Disposition: A | Discharge status: Home / Self Care | Payer: Medicare HMO | Attending: Internal Medicine | Admitting: Internal Medicine

## 2020-06-05 ENCOUNTER — Observation Stay (HOSPITAL_COMMUNITY): Payer: Medicare HMO

## 2020-06-05 ENCOUNTER — Encounter (HOSPITAL_COMMUNITY): Payer: Self-pay | Admitting: Internal Medicine

## 2020-06-05 ENCOUNTER — Observation Stay (HOSPITAL_BASED_OUTPATIENT_CLINIC_OR_DEPARTMENT_OTHER): Payer: Medicare HMO

## 2020-06-05 ENCOUNTER — Other Ambulatory Visit: Payer: Self-pay

## 2020-06-05 ENCOUNTER — Encounter (HOSPITAL_COMMUNITY)
Admission: EM | Disposition: A | Discharge status: Home / Self Care | Payer: Self-pay | Source: Home / Self Care | Attending: Emergency Medicine

## 2020-06-05 DIAGNOSIS — I340 Nonrheumatic mitral (valve) insufficiency: Secondary | ICD-10-CM

## 2020-06-05 DIAGNOSIS — K648 Other hemorrhoids: Secondary | ICD-10-CM

## 2020-06-05 DIAGNOSIS — I351 Nonrheumatic aortic (valve) insufficiency: Secondary | ICD-10-CM | POA: Diagnosis not present

## 2020-06-05 DIAGNOSIS — E782 Mixed hyperlipidemia: Secondary | ICD-10-CM

## 2020-06-05 DIAGNOSIS — N183 Chronic kidney disease, stage 3 unspecified: Secondary | ICD-10-CM

## 2020-06-05 DIAGNOSIS — I651 Occlusion and stenosis of basilar artery: Secondary | ICD-10-CM

## 2020-06-05 DIAGNOSIS — I350 Nonrheumatic aortic (valve) stenosis: Secondary | ICD-10-CM | POA: Diagnosis not present

## 2020-06-05 DIAGNOSIS — R7401 Elevation of levels of liver transaminase levels: Secondary | ICD-10-CM

## 2020-06-05 DIAGNOSIS — R569 Unspecified convulsions: Secondary | ICD-10-CM

## 2020-06-05 DIAGNOSIS — N1831 Chronic kidney disease, stage 3a: Secondary | ICD-10-CM | POA: Diagnosis not present

## 2020-06-05 DIAGNOSIS — D696 Thrombocytopenia, unspecified: Secondary | ICD-10-CM

## 2020-06-05 DIAGNOSIS — R933 Abnormal findings on diagnostic imaging of other parts of digestive tract: Secondary | ICD-10-CM

## 2020-06-05 DIAGNOSIS — R55 Syncope and collapse: Secondary | ICD-10-CM

## 2020-06-05 DIAGNOSIS — K5730 Diverticulosis of large intestine without perforation or abscess without bleeding: Secondary | ICD-10-CM

## 2020-06-05 HISTORY — PX: FLEXIBLE SIGMOIDOSCOPY: SHX5431

## 2020-06-05 LAB — HEPATIC FUNCTION PANEL
ALT: 339 U/L — ABNORMAL HIGH (ref 0–44)
ALT: 310 U/L — ABNORMAL HIGH (ref 0–44)
AST: 312 U/L — ABNORMAL HIGH (ref 15–41)
AST: 280 U/L — ABNORMAL HIGH (ref 15–41)
Albumin: 3.6 g/dL (ref 3.5–5.0)
Albumin: 3.2 g/dL — ABNORMAL LOW (ref 3.5–5.0)
Alkaline Phosphatase: 507 U/L — ABNORMAL HIGH (ref 38–126)
Alkaline Phosphatase: 447 U/L — ABNORMAL HIGH (ref 38–126)
Bilirubin, Direct: 0.3 mg/dL — ABNORMAL HIGH (ref 0.0–0.2)
Bilirubin, Direct: 0.2 mg/dL (ref 0.0–0.2)
Indirect Bilirubin: 0.8 mg/dL (ref 0.3–0.9)
Indirect Bilirubin: 0.6 mg/dL (ref 0.3–0.9)
Total Bilirubin: 1.1 mg/dL (ref 0.3–1.2)
Total Bilirubin: 0.8 mg/dL (ref 0.3–1.2)
Total Protein: 7.4 g/dL (ref 6.5–8.1)
Total Protein: 6.6 g/dL (ref 6.5–8.1)

## 2020-06-05 LAB — BASIC METABOLIC PANEL
Anion gap: 8 (ref 5–15)
BUN: 18 mg/dL (ref 8–23)
CO2: 25 mmol/L (ref 22–32)
Calcium: 9.2 mg/dL (ref 8.9–10.3)
Chloride: 105 mmol/L (ref 98–111)
Creatinine, Ser: 1.09 mg/dL (ref 0.61–1.24)
GFR calc Af Amer: >60 mL/min (ref >60)
GFR calc non Af Amer: >60 mL/min (ref >60)
Glucose, Bld: 88 mg/dL (ref 70–99)
Potassium: 4.1 mmol/L (ref 3.5–5.1)
Sodium: 138 mmol/L (ref 135–145)

## 2020-06-05 LAB — ECHOCARDIOGRAM COMPLETE
AR max vel: 0.71 cm2
AV Area VTI: 0.72 cm2
AV Area mean vel: 0.68 cm2
AV Mean grad: 20.5 mmHg
AV Peak grad: 34.9 mmHg
Ao pk vel: 2.96 m/s
Area-P 1/2: 3.01 cm2
Height: 67 in
MV M vel: 5.19 m/s
MV Peak grad: 107.7 mmHg
P 1/2 time: 565 msec
Radius: 0.4 cm
S' Lateral: 3.27 cm
Weight: 2409.19 oz

## 2020-06-05 LAB — HEPATITIS B SURFACE ANTIGEN: Hepatitis B Surface Ag: NONREACTIVE

## 2020-06-05 LAB — HEPATITIS C ANTIBODY: HCV Ab: NONREACTIVE

## 2020-06-05 LAB — VITAMIN B12: Vitamin B-12: 609 pg/mL (ref 180–914)

## 2020-06-05 LAB — FOLATE: Folate: 17.1 ng/mL (ref >5.9)

## 2020-06-05 LAB — GLUCOSE, CAPILLARY: Glucose-Capillary: 93 mg/dL (ref 70–99)

## 2020-06-05 LAB — T4, FREE: Free T4: 0.81 ng/dL (ref 0.61–1.12)

## 2020-06-05 LAB — TSH: TSH: 1.349 u[IU]/mL (ref 0.350–4.500)

## 2020-06-05 SURGERY — SIGMOIDOSCOPY, FLEXIBLE
Anesthesia: Moderate Sedation

## 2020-06-05 MED ORDER — SODIUM CHLORIDE 0.9 % IV SOLN: INTRAVENOUS | Status: AC

## 2020-06-05 MED ORDER — POLYETHYLENE GLYCOL 3350 17 G PO PACK
8.5000 g | PACK | Freq: Every day | ORAL | Status: DC
  Filled 2020-06-05: qty 1

## 2020-06-05 MED ORDER — MIDAZOLAM HCL 5 MG/5ML IJ SOLN
INTRAMUSCULAR | Status: DC | PRN
  Administered 2020-06-05 (×2): 1 mg via INTRAVENOUS

## 2020-06-05 MED ORDER — GADOBUTROL 1 MMOL/ML IV SOLN
5.0000 mL | Freq: Once | INTRAVENOUS | Status: AC | PRN
  Administered 2020-06-05: 6 mL via INTRAVENOUS

## 2020-06-05 MED ORDER — ASPIRIN 325 MG PO TABS
325.0000 mg | ORAL_TABLET | Freq: Every day | ORAL | Status: DC
  Administered 2020-06-05 – 2020-06-06 (×2): 325 mg via ORAL
  Filled 2020-06-05 (×2): qty 1

## 2020-06-05 MED ORDER — SODIUM CHLORIDE 0.9 % IV SOLN
INTRAVENOUS | Status: DC | PRN
  Administered 2020-06-05: 1000 mL via INTRAMUSCULAR

## 2020-06-05 MED ORDER — MEPERIDINE HCL 50 MG/ML IJ SOLN
INTRAMUSCULAR | Status: AC
  Filled 2020-06-05: qty 1

## 2020-06-05 MED ORDER — MIDAZOLAM HCL 5 MG/5ML IJ SOLN
INTRAMUSCULAR | Status: AC
  Filled 2020-06-05: qty 10

## 2020-06-05 MED ORDER — FLEET ENEMA 7-19 GM/118ML RE ENEM: 1.0000 | ENEMA | Freq: Every day | RECTAL | Status: DC | PRN

## 2020-06-05 MED ORDER — FLEET ENEMA 7-19 GM/118ML RE ENEM: 2.0000 | ENEMA | Freq: Every day | RECTAL | Status: DC | PRN

## 2020-06-05 NOTE — Evaluation (Signed)
Occupational Therapy Evaluation Patient Details Name: Charles Morgan MRN: [id number] DOB: 08/23/1930 Today's Date: 06/05/2020    History of Present Illness Charles Morgan is a 86 y.o. male with medical history significant of vertebrobasilar insufficiency, multiple episodes of syncope, h/o CVAs, AoV Calcification/stenosis, hyperlipidemia. Patient lives in [place]. Was able to access and review [facility] records, [place]. Patient has had increasing debility with gait disturbance and need for assistance with ADLs to the point where his long estranged daughter has reengaged and has become his primary care giver. They are in [place] visiting family with plans to move here from [place]. On the day of admission his daughter found him unconscious and unresponsive for several minutes until EMS arrived. CT negative for acute infarct. Pt is unable to have MRI   Clinical Impression   Pt agreeable to OT evaluation, reports feeling much better than he was on admission. Pt performing ADLs with supervision/min guard assist. Pt has a hx of frequent falls, no LOB with use of RW. Educated on benefit of using RW to assist with balance and prevent falls, pt verbalized understanding and reports he has recently been using his RW more. Pt performing standing tasks at sink for ~8 minutes without LOB. Pt is at baseline with ADL completion, no further OT services required at this time. Pt reports daughter will be available to assist with ADLs as needed at home.     Follow Up Recommendations  No OT follow up;Supervision/Assistance - 24 hour    Equipment Recommendations  None recommended by OT       Precautions / Restrictions Precautions Precautions: Fall Restrictions Weight Bearing Restrictions: No      Mobility Bed Mobility Overal bed mobility: Modified Independent                Transfers Overall transfer level: Needs assistance Equipment used: Rolling walker (2 wheeled) Transfers: Sit to/from  [employer] Sit to Stand: Min assist Stand pivot transfers: Min guard                ADL either performed or assessed with clinical judgement   ADL Overall ADL's : Needs assistance/impaired     Grooming: Wash/dry hands;Wash/dry face;Supervision/safety;Standing Grooming Details (indicate cue type and reason): Pt standing at sink for grooming tasks, no LOB                 Toilet Transfer: Min guard;Ambulation;RW;Regular Teacher, adult education Details (indicate cue type and reason): simulated with transfer to chair         Functional mobility during ADLs: Min guard;Rolling walker       Vision Baseline Vision/History: No visual deficits Patient Visual Report: No change from baseline Vision Assessment?: No apparent visual deficits            Pertinent Vitals/Pain Pain Assessment: No/denies pain     Hand Dominance Right   Extremity/Trunk Assessment Upper Extremity Assessment Upper Extremity Assessment: Overall WFL for tasks assessed   Lower Extremity Assessment Lower Extremity Assessment: Defer to PT evaluation   Cervical / Trunk Assessment Cervical / Trunk Assessment: Normal   Communication Communication Communication: No difficulties   Cognition Arousal/Alertness: Awake/alert Behavior During Therapy: WFL for tasks assessed/performed Overall Cognitive Status: Within Functional Limits for tasks assessed  Home Living Family/patient expects to be discharged to:: Private residence Living Arrangements: Children Available Help at Discharge: Family;Available 24 hours/day Type of Home: House       Home Layout: One level               Home Equipment: Walker - 2 wheels;Shower seat;Wheelchair - manual;Hospital bed          Prior Functioning/Environment Level of Independence: Needs assistance  Gait / Transfers Assistance Needed: Pt uses RW for mobility, has hx of  frequent falls ADL's / Homemaking Assistance Needed: Daughter assists with meal prep and housekeeping; pt reports independence in basic ADLs            OT Problem List: Decreased activity tolerance;Impaired balance (sitting and/or standing)    AM-PAC OT "6 Clicks" Daily Activity     Outcome Measure Help from another person eating meals?: None Help from another person taking care of personal grooming?: None Help from another person toileting, which includes using toliet, bedpan, or urinal?: A Little Help from another person bathing (including washing, rinsing, drying)?: A Little Help from another person to put on and taking off regular upper body clothing?: None Help from another person to put on and taking off regular lower body clothing?: None 6 Click Score: 22   End of Session Equipment Utilized During Treatment: Gait belt;Rolling walker  Activity Tolerance: Patient tolerated treatment well Patient left: in chair;with call bell/phone within reach;with chair alarm set  OT Visit Diagnosis: Muscle weakness (generalized) (M62.81);Unsteadiness on feet (R26.81)                Time: 5038-8828 OT Time Calculation (min): 32 min Charges:  OT General Charges $OT Visit: 1 Visit OT Evaluation $OT Eval Low Complexity: 1 Low OT Treatments $Self Care/Home Management : 8-22 mins   Ezra Sites, OTR/L  [phone] 06/05/2020, 8:18 AM

## 2020-06-05 NOTE — Procedures (Signed)
Patient Name: Charles Morgan  MRN: [id number]  Epilepsy Attending: Charlsie Quest  Referring Physician/Provider: Dr. Onalee Hua Tat Date: 06/05/1020 Duration: 29.13 minutes  Patient history: 86 year old male with recurrent syncope.  EEG to evaluate for seizures.  Level of alertness: Awake, asleep  AEDs during EEG study: None  Technical aspects: This EEG study was done with scalp electrodes positioned according to the 10-20 International system of electrode placement. Electrical activity was acquired at a sampling rate of 500Hz  and reviewed with a high frequency filter of 70Hz  and a low frequency filter of 1Hz . EEG data were recorded continuously and digitally stored.   Description: The posterior dominant rhythm consists of 8.5 Hz activity of moderate voltage (25-35 uV) seen predominantly in posterior head regions, symmetric and reactive to eye opening and eye closing. Sleep was characterized by vertex waves, sleep spindles (12 to 14 Hz), maximal frontocentral region.  EEG also showed intermittent 2 to 3 Hz delta slowing in right temporal region.  Hyperventilation and photic stimulation were not performed.     ABNORMALITY -Intermittent slow, right temporal region  IMPRESSION: This study is suggestive of nonspecific cortical dysfunction in the right temporal region. No seizures or epileptiform discharges were seen throughout the recording.  Iysha Mishkin 

## 2020-06-05 NOTE — TOC Initial Note (Signed)
Transition of Care [facility]) - Initial/Assessment Note    Patient Details  Name: Charles Morgan MRN: [id number] Date of Birth: 1930/05/03  [facility] CM/SW Contact:    Karn Cassis, LCSW Phone Number: 06/05/2020, 9:30 AM  Clinical Narrative:  Pt admitted due to syncope. LCSW spoke with pt's daughter, Gavin Pound to complete assessment. Gavin Pound reports she moved to [place] 6 months ago to help take care of pt, as all family was estranged prior to this. Gavin Pound is trying to move back to [place] with pt and was here making arrangements when pt was hospitalized. She will know on Monday if they have apartment and will be staying. At baseline, pt requires assistance with ADLs. Gavin Pound indicates he falls frequently and although he has a walker, he does not want to use it. LCSW discussed PT recommendation for home health. She states that pt does not want anyone coming into his home. LCSW offered outpatient rehab and Gavin Pound is agreeable. Referral made. Will include information on AVS. Gavin Pound is working on finding PCP here. TOC will continue to follow.                    Expected Discharge Plan: OP Rehab Barriers to Discharge: Continued Medical Work up   Patient Goals and CMS Choice Patient states their goals for this hospitalization and ongoing recovery are:: return home      Expected Discharge Plan and Services Expected Discharge Plan: OP Rehab In-house Referral: Clinical Social Work, Orthoptist     Living arrangements for the past 2 months: Single Family Home                                      Prior Living Arrangements/Services Living arrangements for the past 2 months: Single Family Home Lives with:: Adult Children   Do you feel safe going back to the place where you live?: Yes      Need for Family Participation in Patient Care: Yes (Comment) Care giver support system in place?: Yes (comment) Current home services: DME [facility]) Criminal Activity/Legal  Involvement Pertinent to Current Situation/Hospitalization: No - Comment as needed  Activities of Daily Living Home Assistive Devices/Equipment: Environmental consultant (specify type) ADL Screening (condition at time of admission) Patient's cognitive ability adequate to safely complete daily activities?: Yes Is the patient deaf or have difficulty hearing?: No Does the patient have difficulty seeing, even when wearing glasses/contacts?: No Does the patient have difficulty concentrating, remembering, or making decisions?: Yes Patient able to express need for assistance with ADLs?: Yes Does the patient have difficulty dressing or bathing?: No Independently performs ADLs?: No Communication: Independent Dressing (OT): Independent Grooming: Independent Feeding: Independent Bathing: Independent (needs assist at times) Toileting: Independent In/Out Bed: Independent Walks in Home: Independent with device (comment) Does the patient have difficulty walking or climbing stairs?: Yes Weakness of Legs: Both Weakness of Arms/Hands: None  Permission Sought/Granted                  Emotional Assessment Appearance:: Appears stated age       Alcohol / Substance Use: Not Applicable Psych Involvement: No (comment)  Admission diagnosis:  Syncope and collapse [R55] TIA (transient ischemic attack) [G45.9] Syncope [R55] Acute renal insufficiency [N28.9] LFT elevation [R79.89] Patient Active Problem List   Diagnosis Date Noted  . CKD (chronic kidney disease) stage 3, GFR 30-59 ml/min 06/05/2020  . Basilar artery stenosis 06/05/2020  .  Transaminasemia 06/05/2020  . Thrombocytopenia (HCC) 06/05/2020  . Syncope 06/04/2020  . Dehydration 06/04/2020  . AKI (acute kidney injury) (HCC) 06/04/2020  . HLD (hyperlipidemia) 06/04/2020  . Calcification of aortic valve 06/04/2020  . Syncope and collapse 06/04/2020   PCP:  Patient, No Pcp Per Pharmacy:   [id number] - [place], [place] - [address] [place] [address] Phone: [phone] Fax: [phone]     Social Determinants of Health (SDOH) Interventions    Readmission Risk Interventions No flowsheet data found.

## 2020-06-05 NOTE — Consult Note (Signed)
Referring Provider: Catarina Hartshorn, DO Primary Care Physician:  Patient, No Pcp Per Primary Gastroenterologist:  Dr. Karilyn Cota  Reason for Consultation:    Elevated transaminases.  Dilated bile duct. Thickening to rectum and sigmoid colon on CT of 05/19/2020.  HPI:   History is obtained from patient and his chart. I did talk with his daughter Ms. Carolyn Stare over the phone that she does not have any records or details about patient's past history.  Patient is 86 year old African-American male who has lived in [facility] all his life and now planning to move to [place] to be closer to his daughter.  Patient is retired Cytogeneticist. Patient came to emergency room on 05/19/2020 complaining of rectal pain.  His CBC was normal.  H&H was 14.6 and 46.0.  Chemistry panel was pertinent for AST of 46 and ALT of 54.  Alkaline phosphatase was 145.  Bilirubin was 0.8.  He underwent abdominopelvic CT with contrast.  He was noted to have mild wall thickening to the rectum and sigmoid colon with pericolonic edema.  It showed colonic diverticulosis without diverticulitis dilated bile duct which measured 12 mm proximally.  There was no cholelithiasis or choledocholithiasis.  He also had 4 mm subpleural nodule at left lower chest ill-defined soft tissue thickening to the inferior aspect of left buttock with some fat stranding but no air-fluid level. Patient was deemed to be stable and was discharged with recommendation for outpatient MRCP and follow-up with his GI doctor.  He tells me that he was given antibiotics from 1 week which she finished. Patient returned to emergency room yesterday following a syncopal episode which occurred while he was sitting.  Unenhanced head CT was unremarkable.  LFTs were noted to be abnormal.  AST was 269 and ALT was 308 with alkaline phosphatase of 479.  Please note LFTs from 05/19/2020 as above.  LFTs from this morning are even higher as below. Acetaminophen level on admission was less  than 10.  Hepatitis C and hepatitis B surface antigen are pending. Patient denies abdominal pain nausea vomiting heartburn or dysphagia.  He tells me he is prone to constipation but yesterday he had loose stool and he was incontinent.  He also noted some blood.  He recalls he had colonoscopy maybe 20 years ago and was normal.  He states he has had a good appetite and has not lost any weight recently. Regarding syncopal episodes he has had them since he was 86 years old.  He has had extensive evaluation in the past.  He had loop recorder placed 3 months ago.  Syncopal episodes are felt to be due to vertebrobasilar insufficiency.  He has been evaluated by Dr. Simona Huh of cardiology service and does an echocardiogram.  Patient is widowed.  His wife is disease.  He states he has 3 daughters and 3 sons living.  2 of his children are disease.  He smokes cigarettes for 6 or 7 years and quit 60 years ago.  He states he has had very few drinks of alcohol in the last 15 years.  No history of liver disease or jaundice. 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  He retired when he was 37 years old. His father died of cancer at age 70.  Mother lived to be in her 64s.  He had 4 sisters and they are all disease.  One had cancer.     Past Medical History:  Diagnosis Date  . Arthritis   .  Bradycardia, sinus 2021   nocturnal, asymptomatic. Had cardiology eval  . CVA (cerebral vascular accident) [facility])    two events: 2013, 2019  . DVT (deep venous thrombosis) (HCC) Aug '20   right LE  . HLD (hyperlipidemia)   . Proctocolitis without complication Aug '21   CT diagnosis  . Syncope and collapse    multiple events. Neuro eval - no w/u needed w/ dx VB Insufficiency  . Thalamic hemorrhage [facility]) May '21   by MRI - diagnosed as chronic. Neurology eval - no need for further w/u  . V-tach [facility])    multiple episodes non-sustained V tach. Had Cardiology eval  . Vertebrobasilar artery  insufficiency     Past Surgical History:  Procedure Laterality Date  . ABDOMINAL SURGERY    . HEMORRHOIDECTOMY WITH HEMORRHOID BANDING    . LOOP RECORDER IMPLANT    . SHOULDER SURGERY    . thumb surgery      Prior to Admission medications   Medication Sig Start Date End Date Taking? Authorizing Provider  aspirin EC 81 MG tablet Take 81 mg by mouth daily. Swallow whole.    [provider]  atorvastatin (LIPITOR) 80 MG tablet Take 1 tablet by mouth in the morning and at bedtime. 01/08/20   [provider]  docusate sodium (COLACE) 100 MG capsule Take 100 mg by mouth 2 (two) times daily.    [provider]    Current Facility-Administered Medications  Medication Dose Route Frequency Provider Last Rate Last Admin  . acetaminophen (TYLENOL) tablet 650 mg  650 mg Oral Q6H PRN Norins, Rosalyn Gess, MD       Or  . acetaminophen (TYLENOL) suppository 650 mg  650 mg Rectal Q6H PRN Norins, Rosalyn Gess, MD      . aspirin tablet 325 mg  325 mg Oral Daily Tat, David, MD   325 mg at 06/05/20 1000  . docusate sodium (COLACE) capsule 100 mg  100 mg Oral BID Jacques Navy, MD   100 mg at 06/05/20 1000  . enoxaparin (LOVENOX) injection 40 mg  40 mg Subcutaneous Q24H Norins, Rosalyn Gess, MD   40 mg at 06/04/20 1759  . midodrine (PROAMATINE) tablet 5 mg  5 mg Oral BID WC Norins, Rosalyn Gess, MD   5 mg at 06/05/20 1000  . pantoprazole (PROTONIX) EC tablet 40 mg  40 mg Oral Daily Norins, Rosalyn Gess, MD   40 mg at 06/05/20 1000  . sodium chloride flush (NS) 0.9 % injection 3 mL  3 mL Intravenous Q12H Norins, Rosalyn Gess, MD   3 mL at 06/05/20 1000    Allergies as of 06/04/2020 - Review Complete 06/04/2020  Allergen Reaction Noted  . Tuberculin Rash 05/02/2007    No family history on file.  Social History   Socioeconomic History  . Marital status: Legally Separated    Spouse name: Not on file  . Number of children: Not on file  . Years of education: Not on file  . Highest  education level: Not on file  Occupational History  . Not on file  Tobacco Use  . Smoking status: Never Smoker  . Smokeless tobacco: Never Used  Substance and Sexual Activity  . Alcohol use: Never  . Drug use: Never  . Sexual activity: Not on file  Other Topics Concern  . Not on file  Social History Narrative  . Not on file   Social Determinants of Health   Financial Resource Strain:   . Difficulty  of Paying Living Expenses: Not on file  Food Insecurity:   . Worried About Programme researcher, broadcasting/film/video in the Last Year: Not on file  . Ran Out of Food in the Last Year: Not on file  Transportation Needs:   . Lack of Transportation (Medical): Not on file  . Lack of Transportation (Non-Medical): Not on file  Physical Activity:   . Days of Exercise per Week: Not on file  . Minutes of Exercise per Session: Not on file  Stress:   . Feeling of Stress : Not on file  Social Connections:   . Frequency of Communication with Friends and Family: Not on file  . Frequency of Social Gatherings with Friends and Family: Not on file  . Attends Religious Services: Not on file  . Active Member of Clubs or Organizations: Not on file  . Attends Banker Meetings: Not on file  . Marital Status: Not on file  Intimate Partner Violence:   . Fear of Current or Ex-Partner: Not on file  . Emotionally Abused: Not on file  . Physically Abused: Not on file  . Sexually Abused: Not on file    Review of Systems: See HPI, otherwise normal ROS  Physical Exam: Temp:  [97.5 F (36.4 C)-98.3 F (36.8 C)] 97.5 F (36.4 C) (09/10 0446) Pulse Rate:  [52-87] 71 (09/10 0446) Resp:  [16-23] 18 (09/10 0446) BP: (99-162)/(66-107) 132/76 (09/10 0446) SpO2:  [96 %-100 %] 100 % (09/10 0446) Weight:  [65.3 kg-68.3 kg] 68.3 kg (09/10 0447) Last BM Date: 06/04/20  Patient is alert and in no acute distress. He responds appropriately to questions. Conjunctiva was pink.  Sclerae nonicteric. Oropharyngeal mucosa  is unremarkable.  He has 2 teeth in lower jaw.  One is rudimentary. Neck without masses or thyromegaly. Cardiac exam with regular rhythm normal S1 and S2.  He has grade 2/6 systolic murmur best heard at aortic area. Auscultation lungs reveal vesicular breath sounds bilaterally. Abdomen is full.  He has small umbilical hernia.  Hernia is reducible.  He has upper midline scar(he tells me he had laparotomy when he was in his 68s because he had very high white cell count and nothing abnormal was found).  On palpation abdomen is soft and nontender with organomegaly or masses. He does not have peripheral edema.  He may have slight clubbing.  Lab Results: Recent Labs    06/04/20 1248 06/04/20 1257  WBC  --  6.8  HGB 14.6 14.1  HCT 43.0 43.9  PLT  --  126*   BMET Recent Labs    06/04/20 1248 06/04/20 1257 06/05/20 0425  NA 140 138 138  K 5.0 4.1 4.1  CL 107 106 105  CO2  --  25 25  GLUCOSE 131* 127* 88  BUN 19 16 18   CREATININE 1.50* 1.28* 1.09  CALCIUM  --  9.1 9.2   LFT Recent Labs    06/05/20 0425  PROT 7.4  ALBUMIN 3.6  AST 312*  ALT 339*  ALKPHOS 507*  BILITOT 1.1  BILIDIR 0.3*  IBILI 0.8   PT/INR Recent Labs    06/04/20 1257  LABPROT 12.8  INR 1.0    Studies/Results: X-ray chest PA and lateral  Result Date: 06/04/2020 CLINICAL DATA:  Syncope EXAM: CHEST - 2 VIEW COMPARISON:  None. FINDINGS: Left chest electronic recording device. No consolidation or effusion. Cardiac size within normal limits. Aortic atherosclerosis with mild unfolding of aorta. No pneumothorax. IMPRESSION: No active cardiopulmonary disease. Electronically Signed  By: Jasmine PangKim  Fujinaga M.D.   On: 06/04/2020 17:47   CT HEAD WO CONTRAST  Result Date: 06/04/2020 CLINICAL DATA:  TIA symptoms with weakness EXAM: CT HEAD WITHOUT CONTRAST TECHNIQUE: Contiguous axial images were obtained from the base of the skull through the vertex without intravenous contrast. COMPARISON:  None FINDINGS: Brain: There is  moderate diffuse atrophy, stable. There is no intracranial mass, hemorrhage, extra-axial fluid collection, or midline shift. There is patchy small vessel disease in the centra semiovale bilaterally. There is evidence of a prior infarct in the posterior mid left cerebellum. There is a second prior infarct laterally in the mid to upper right cerebellum. Small vessel disease is noted in each lateral thalamus. No acute infarct is evident on this study. Vascular: No hyperdense vessel. There is calcification in each carotid siphon region. Skull: The bony calvarium appears intact. Sinuses/Orbits: There is mucosal thickening involving several ethmoid air cells. Other visualized paranasal sinuses are clear. Visualized orbits appear symmetric bilaterally. Other: There is opacification in several mastoid air cells bilaterally. There is debris in the left external auditory canal. IMPRESSION: Moderate atrophy with supratentorial small vessel disease. Prior infarcts in each cerebellum, chronic. No acute infarct demonstrable. No mass or hemorrhage. There are foci of arterial vascular calcification bilaterally. Mucosal thickening noted in several ethmoid air cells. Opacification in several mastoid air cells bilaterally. Probable cerumen in left external auditory canal. Electronically Signed   By: Bretta BangWilliam  Woodruff III M.D.   On: 06/04/2020 13:16   I have reviewed patient's CT with Dr. Tyron RussellBoles.  Findings as above.  Assessment;  Patient is an elderly gentleman who presents with syncopal episode and found to have elevated transaminases.  Recent CT revealed dilated biliary system without choledocholithiasis or cholelithiasis as well as thickening to rectum and sigmoid colon concerning for colitis. As rest patient syncope is concerned he has been having these spells for the last 21 years and has undergone extensive work-up.  Since he also has history of aortic stenosis he is being evaluated by cardiology to make sure he does not  have high-grade aortic stenosis. GI issues are as follows  #1.  Dilated bile duct.  Patient has no biliary type of symptoms.  LFTs are elevated.  Review of records in epic revealed he had normal transaminases on 2 occasions in March 2021.  His transaminases are barely elevated on 05/19/2020 and now markedly elevated.  Given dilated bile duct 1 has to be concerned about choledocholithiasis but he could also have sphincter of Oddi dysfunction.  Ampullary or pancreatic carcinoma unlikely given CT findings.  I do not believe ultrasound would provide us critical information that we need to manage his condition.  Therefore MRCP would be the next step.  Patient has loop recorder and I believe MRCP can be safely performed in the setting.    #2.  Wall thickening to rectum and sigmoid colon.  While he had loose stool yesterday and reports passing some blood yesterday and he has had rectal pain recently.  Differential diagnosis includes proctocolitis or neoplasm.  Doubt false positive study.  #3.  Mild thrombocytopenia.  Etiology unclear.  Recommendations;  Cancel abdominal ultrasound and proceed with MRCP. Patient will need diagnostic flexible sigmoidoscopy.  Patient is on full dose aspirin as well as enoxaparin given history of DVT.  Last dose was around 6 PM yesterday. Would consider evaluation possibly later today.   LOS: 0 days   Aarian Cleaver  06/05/2020, 10:20 AM

## 2020-06-05 NOTE — Progress Notes (Signed)
EEG complete - results pending 

## 2020-06-05 NOTE — Progress Notes (Signed)
Spoke with Charles Morgan in MRI department to inform him Dr. Karilyn Cota wants pt to have MRI at this time. Per April from [employer] pt's loop recorder is MRI safe. Charles Morgan is to call April from [employer] at phone number [phone] to get model number info. Will continue to follow

## 2020-06-05 NOTE — Care Management Obs Status (Signed)
MEDICARE OBSERVATION STATUS NOTIFICATION   Patient Details  Name: Charles Morgan MRN: [id number] Date of Birth: [birth year]   Medicare Observation Status Notification Given:  Yes    [name] 06/05/2020, 2:32 PM

## 2020-06-05 NOTE — Progress Notes (Signed)
Brief flexible sigmoidoscopy note.  Excellent prep to 45 cm from the anal margin. Formed stool in proximal sigmoid colon. Small diverticulum at sigmoid colon. Mucosa of sigmoid colon and rectum normal. Small internal hemorrhoids.

## 2020-06-05 NOTE — Progress Notes (Signed)
ECHO results noted. He has severe aortic stenosis.  MRCP reviewed with Dr. Ulyses Southward. Study is normal. Bile duct is not dilated anymore.  It measures 7.5 mm in porta hepatis and distally. It may indicate that he has passed a stone.  If this is the case I would expect significant drop in transaminases. LFTs to be repeated in a.m.

## 2020-06-05 NOTE — Care Management Obs Status (Signed)
MEDICARE OBSERVATION STATUS NOTIFICATION   Patient Details  Name: Charles Morgan MRN: [id number] Date of Birth: [birth year]   Medicare Observation Status Notification Given:       [name] 06/05/2020, 2:20 PM

## 2020-06-05 NOTE — TOC Progression Note (Signed)
Transition of Care [facility]) - Progression Note    Patient Details  Name: Charles Morgan MRN: [id number] Date of Birth: [birth year]  Transition of Care [facility]) CM/SW Contact  Karn Cassis, [place] Phone Number: 06/05/2020, 3:16 PM  Clinical Narrative:  TOC received consult for advance directives. Consult placed for chaplain. LCSW provided copy of advance directives for daughter to review.      Expected Discharge Plan: OP Rehab Barriers to Discharge: Continued Medical Work up  Expected Discharge Plan and Services Expected Discharge Plan: OP Rehab In-house Referral: Clinical Social Work, Chaplain     Living arrangements for the past 2 months: Single Family Home                                       Social Determinants of Health (SDOH) Interventions    Readmission Risk Interventions No flowsheet data found.

## 2020-06-05 NOTE — Progress Notes (Signed)
*  PRELIMINARY RESULTS* Echocardiogram 2D Echocardiogram has been performed.  Stacey Drain 06/05/2020, 9:51 AM

## 2020-06-05 NOTE — Consult Note (Addendum)
Cardiology Consultation:   Patient ID: Charles Morgan MRN: [id number]; DOB: 11-03-1929  Admit date: 06/04/2020 Date of Consult: 06/05/2020  Primary Care Provider: None at present [facility] Cardiologist: None at present [facility] Electrophysiologist:  Was seeing Dr. Madelon Lips [phone] [facility])  Patient Profile:   Charles Morgan is an 86 y.o. male with a longstanding history of recurrent syncope without clear-cut etiology (records suggest concern for multifactorial causes related to vertebrobasilar insufficiency, autonomic dysfunction, no definite association with bradycardia or NSVT which has also been incidentally noted), cerebrovascular disease, arthritis, and hyperlipidemia  who is being seen today for the evaluation of syncope at the request of Dr. Carles Collet.  History of Present Illness:   Charles Morgan with above history from [facility] medical reviewed in [place], recently moved here from [place] to live with daughter. He has a longstanding history of recurrent syncope without definitive etiology, although suspected to be related to vertebrobasilar insufficiency and autonomic dysfunction most likely per records. No definite association with bradycardia or NSVT, LVEF normal. Pt was started Morgan midodrine in May 2021. He does have aortic stenosis, report does not clearly define degree but records suggest that this was not related to his syncope. He had a Biotronik ILR placed about 2 months ago by cardiologist in [place], not clear about interrogation history.  Pt found slumped over in chair and left sided weakness by daughter. Pt was reportedly unconscious and unresponsive.  In ER was awake.  CT head no acute changes. He does not recall any details about the incident in terms of prodrome and states that this is typically the case with his episodes of syncope.  EKG:  The EKG was personally reviewed and demonstrates:  SB at 53 with deep T wave inversion in V3, V4-6 and II,III,  AVF.  From old EKG reports HR 49 and possible lateral ischemia  Telemetry:  Telemetry was personally reviewed and demonstrates:  SB with PVCs  Na 138, K+ 4.1, ALK phos 507, AST 313, ALT 339 have increased since 05/19/20 CK 171 Hgb 14.1, WBC 6.8 plts 126  CXR NAD BP 147/82 to 132/76   Past Medical History:  Diagnosis Date  . Aortic stenosis   . Arthritis   . Bradycardia, sinus 2021   Nocturnal bradycardia, not clearly symptomatic or associated with syncope  . CVA (cerebral vascular accident) [facility])    Two events: 2013, 2019  . DVT (deep venous thrombosis) ([place]) Aug '20   right LE  . HLD (hyperlipidemia)   . NSVT (nonsustained ventricular tachycardia) ([facility])    LVEF normal, not associated with syncope  . Proctocolitis without complication Aug '21   CT diagnosis  . Syncope and collapse    Multiple recurrences - vertebrobasilar insufficiency, autonomic dysfunction, ILR placed by cardiologist in [place]  . Thalamic hemorrhage [facility]) May '21   MRI - diagnosed as chronic. Neurology eval - no need for further w/u  . Vertebrobasilar artery insufficiency     Past Surgical History:  Procedure Laterality Date  . ABDOMINAL SURGERY    . HEMORRHOIDECTOMY WITH HEMORRHOID BANDING    . LOOP RECORDER IMPLANT    . SHOULDER SURGERY    . Thumb surgery       Home Medications:  Prior to Admission medications   Medication Sig Start Date End Date Taking? Authorizing Provider  aspirin EC 81 MG tablet Take 81 mg by mouth daily. Swallow whole.    [provider]  atorvastatin (LIPITOR) 80 MG tablet Take 1 tablet by mouth in  the morning and at bedtime. 01/08/20   [provider]  docusate sodium (COLACE) 100 MG capsule Take 100 mg by mouth 2 (two) times daily.    [provider]    Inpatient Medications: Scheduled Meds: . aspirin  325 mg Oral Daily  . docusate sodium  100 mg Oral BID  . enoxaparin (LOVENOX) injection  40 mg Subcutaneous Q24H  . midodrine  5 mg Oral BID  WC  . pantoprazole  40 mg Oral Daily  . sodium chloride flush  3 mL Intravenous Q12H    PRN Meds: acetaminophen **OR** acetaminophen  Allergies:    Allergies  Allergen Reactions  . Tuberculin Rash    Social History:   Social History   Tobacco Use  . Smoking status: Never Smoker  . Smokeless tobacco: Never Used  Substance Use Topics  . Alcohol use: Never    Family History:   Family History  Family history unknown: Yes    No CAD per Pt  ROS:  Please see the history of present illness.  General:no colds or fevers, no weight changes Skin:no rashes or ulcers HEENT:no blurred vision, no congestion CV:see HPI PUL:see HPI GI:no diarrhea constipation or melena, no indigestion GU:no hematuria, no dysuria MS:no joint pain, no claudication Neuro:+ syncope, no lightheadedness, hx of 2 CVAs Endo:no diabetes, no thyroid disease  All other ROS reviewed and negative.     Physical Exam/Data:   Vitals:   06/04/20 2244 06/05/20 0042 06/05/20 0446 06/05/20 0447  BP: (!) 153/88 (!) 147/83 132/76   Pulse: 72 (!) 56 71   Resp:  18 18   Temp:  98.3 F (36.8 C) (!) 97.5 F (36.4 C)   TempSrc:  Oral Oral   SpO2:  97% 100%   Weight:    68.3 kg  Height:        Intake/Output Summary (Last 24 hours) at 06/05/2020 1100 Last data filed at 06/05/2020 1000 Gross per 24 hour  Intake 1492.13 ml  Output 1275 ml  Net 217.13 ml   Last 3 Weights 06/05/2020 06/04/2020 06/04/2020  Weight (lbs) 150 lb 9.2 oz 149 lb 7.6 oz 144 lb  Weight (kg) 68.3 kg 67.8 kg 65.318 kg     Body mass index is 23.58 kg/m.   HEENT: Conjunctiva and lids normal, oropharynx clear. Neck: Supple, no elevated JVP or carotid bruits, no thyromegaly. Lungs: Clear to auscultation, nonlabored breathing at rest. Cardiac: Regular rate and rhythm, no S3, 3/6 basal systolic murmur, no pericardial rub. Abdomen: Soft, nontender, bowel sounds present. Extremities: No pitting edema, distal pulses 2+. Skin: Warm and  dry. Musculoskeletal: No kyphosis. Neuropsychiatric: Alert and oriented x3, affect grossly appropriate.  Relevant CV Studies: Echo 01/07/20   TDS with suboptimal limited images  No parasternal/subcoastal images  Grossly normal LVEF  RV poorly visualized  No evidence of shunting Morgan bubble study  Aortic valve leaflets appears severely restricted and calcified in limited  views but gradient are likely underestimated  Consider alternative imaging modality for better assessment  Interpretation Summary  Previous Echo done 09/01/17.  Aortic Valve:  The aortic valve is not well visualized.  In limited views aortic valve leaflets appears calcified with severe  restriction in mobility  No aortic regurgitation is present.  Arteries:  The aortic root is not well visualized.  The aortic arch is not well visualized.  The pulmonary is not well visualized.  Effusion:  There is no pericardial effusion.  Left Atrium:  The left atrial size is normal.  The interatrial septum is intact with no evidence for an atrial septal defect.  Injection of contrast documented no interatrial shunt.  Left Ventricle:  The left ventricle is grossly normal size.  Theleft ventricle is not well visualized.  The Left Ventricular systolic function is normal.  The Left Ventricular Ejection fraction = 55-60%.  Pulse wave TDI of the anterior and posterior mitral annulas demonstrates  abnormal LV relaxation.  Regional wallmotion abnormalities cannot be excluded due to limited  visualization.  Mitral Valve:  The mitral valve is grossly normal.  There is mild mitral annular calcification.  The mitral valve is not well visualized.  There is no mitral regurgitation noted.  There is no mitral valve stenosis.  Procedure:  A complete two-dimensional transthoracic echocardiogram was performed (2D,  M-mode, spectral and color flow Doppler).  The study was technically difficult with many images being suboptimal in   quality.  Images were not obtained from all of the standard acoustic windows due to the  limited scope of the study.  There was technical limitations during this study due to patients body  habitus.  Patient is very thin.  Was Definity used, No.  Why was Definity not used, unable to obtain consent.  Pulmonic Valve:  The pulmonic valve is not well visualized.  Right Atrium:  Right atrium not well visualized.  Right Ventricle:  The right ventricle is not well visualized.  Right ventricular function cannot be assessed due to poor image quality.  Tricuspid Valve:  The tricuspid valve is not well visualized.  Adequate jet of tricuspid regurgitation was not detected, so RSVP (right  ventricular systolic pressure) could not be estimated.  There was insufficient TR detected to calculate RV systolic pressure.  Venous:  Pulmonary veins were not well visualized during exam.  The inferior vena cava was not visualized during the exam.  The hepatic veins were not visualized during the exam.  MMode 2D Measurements and Calculations  LVOT diam: 2.1 cm  LVOT area: 3.46 cm  LVOT area(traced): 3.46 cm  Doppler Measurements and Calculations  MV E max vel: 67.9 cm/sec  MV A max vel: 100 cm/sec  MV E/A: 0.68  MV dec time: 0.3 sec  Ao V2 max: 254 cm/sec  Ao max PG: 25.81 mmHg  Ao max PG (full): 23.27 mmHg  Ao V2 mean: 197 cm/sec  Ao mean PG: 16 mmHg  Ao mean PG (full): 14 mmHg  Ao V2 VTI: 62.1 cm  AVA(I,A): 1.03 cm  AVA(I,D): 1.03 cm  AVA(V,A): 1.09 cm  AVA(V,D): 1.09 cm  LV V1 max PG: 2.53 mmHg  LV V1 mean PG: 2 mmHg  LV V1 max: 79.6 cm/sec  LV V1 mean: 60.3 cm/sec  LV V1 VTI: 18.4 cm  SV(LVOT): 63.73 ml  SI(LVOT): 36.31 ml/m  [place] Z-Scores(Vital Signs)  BMI: 22.45 kilograms/m  Diastolic Pressure: 81 mmHg  Heart Rate: 59 BPM  BSA([place]cock): 1.76 m  Weight (metric): 65.01 kg  Height (metric): 578.97 cm  Systolic Pressure: 847 mmHg   Laboratory Data:  Chemistry Recent  Labs  Lab 06/04/20 1248 06/04/20 1257 06/05/20 0425  NA 140 138 138  K 5.0 4.1 4.1  CL 107 106 105  CO2  --  25 25  GLUCOSE 131* 127* 88  BUN '19 16 18  ' CREATININE 1.50* 1.28* 1.09  CALCIUM  --  9.1 9.2  GFRNONAA  --  50* >60  GFRAA  --  58* >60  ANIONGAP  --  7 8    Recent  Labs  Lab 06/04/20 1257 06/05/20 0425  PROT 7.1 7.4  ALBUMIN 3.5 3.6  AST 269* 312*  ALT 308* 339*  ALKPHOS 479* 507*  BILITOT 0.8 1.1   Hematology Recent Labs  Lab 06/04/20 1248 06/04/20 1257  WBC  --  6.8  RBC  --  4.72  HGB 14.6 14.1  HCT 43.0 43.9  MCV  --  93.0  MCH  --  29.9  MCHC  --  32.1  RDW  --  14.6  PLT  --  126*    Radiology/Studies:  X-ray chest PA and lateral  Result Date: 06/04/2020 CLINICAL DATA:  Syncope EXAM: CHEST - 2 VIEW COMPARISON:  None. FINDINGS: Left chest electronic recording device. No consolidation or effusion. Cardiac size within normal limits. Aortic atherosclerosis with mild unfolding of aorta. No pneumothorax. IMPRESSION: No active cardiopulmonary disease. Electronically Signed   By: Donavan Foil M.D.   Morgan: 06/04/2020 17:47   CT HEAD WO CONTRAST  Result Date: 06/04/2020 CLINICAL DATA:  TIA symptoms with weakness EXAM: CT HEAD WITHOUT CONTRAST TECHNIQUE: Contiguous axial images were obtained from the base of the skull through the vertex without intravenous contrast. COMPARISON:  None FINDINGS: Brain: There is moderate diffuse atrophy, stable. There is no intracranial mass, hemorrhage, extra-axial fluid collection, or midline shift. There is patchy small vessel disease in the centra semiovale bilaterally. There is evidence of a prior infarct in the posterior mid left cerebellum. There is a second prior infarct laterally in the mid to upper right cerebellum. Small vessel disease is noted in each lateral thalamus. No acute infarct is evident Morgan this study. Vascular: No hyperdense vessel. There is calcification in each carotid siphon region. Skull: The bony calvarium  appears intact. Sinuses/Orbits: There is mucosal thickening involving several ethmoid air cells. Other visualized paranasal sinuses are clear. Visualized orbits appear symmetric bilaterally. Other: There is opacification in several mastoid air cells bilaterally. There is debris in the left external auditory canal. IMPRESSION: Moderate atrophy with supratentorial small vessel disease. Prior infarcts in each cerebellum, chronic. No acute infarct demonstrable. No mass or hemorrhage. There are foci of arterial vascular calcification bilaterally. Mucosal thickening noted in several ethmoid air cells. Opacification in several mastoid air cells bilaterally. Probable cerumen in left external auditory canal. Electronically Signed   By: Lowella Grip III M.D.   Morgan: 06/04/2020 13:16    Assessment and Plan:   1. Longstanding history of recurrent syncope thought to be due to vertebrobasilar insufficiency and autonomic dysfunction. Records suggest documented nocturnal bradycardia and NSVT have not been associated with clinical events. He has a Biotronik ILR in place, placed a few months ago per previous cardiologist in [place]. 2. Hx CVAs 3. Aortic stenosis, description of outside echocardiogram reports does not clearly define degree, described as calcified with severely reduced motion but mean gradient only 14 mmHg. Heart murmur is consistent with aortic stenosis. Patient's syncopal events are not necessarily exertional in nature.     For questions or updates, please contact [place] Please consult [email or web address] for contact info under    Signed, Cecilie Kicks, NP 06/05/2020 11:00 AM    Attending note:  Patient seen and examined. I reviewed extensive outside records and discussed the case with Ms. Ingold NP including modification of the above note including the physical exam as well as assessment and plan.  Patient recently relocated from [place] to this region, living with daughter. Hospitalist  service has requested multi subspecialty involvement in this patient's care including cardiology, neurology,  and gastroenterology.  From a cardiac perspective, plan is to obtain an echocardiogram to reevaluate LVEF and better assess degree of aortic stenosis. Would keep him Morgan telemetry, no arrhythmias documented so far. We will also ask for his Biotronik ILR to be interrogated.  Satira Sark, M.D., F.A.C.C.

## 2020-06-05 NOTE — Progress Notes (Addendum)
PROGRESS NOTE  Charles Morgan FFM:[id number] DOB: 1930/07/11 DOA: 06/04/2020 PCP: Patient, No Pcp Per  Brief History:   86 year old male with a history of basilar artery stenosis, nonsustained ventricular tachycardia, right lower extremity DVT, stroke, and recurrent syncope presenting after another syncopal episode.  Notably, the patient is relocating here to [facility] from [facility] where he has had a very extensive work-up for his recurrent syncope. On his most recent episode on 06/04/2020, the patient recalls only sitting in his chair, and the next thing he remembered was being in the emergency department.  He denied any aura, chest discomfort, palpitations, dizziness, visual disturbance.  His daughter supplements the history and states that he had some complaints of some weakness in his bilateral hands and was feeling "funny" before he slumped over.  She tried to awaken him with protopathic stimuli, but he did not wake up.  There was no tongue biting or bowel or bladder incontinence.  The patient had lost consciousness for several minutes.  He subsequently woke up when EMS arrived, and the daughter stated that he was somewhat confused when he woke up.  The patient had been in his usual state of health without any recent complaints specifically, chest discomfort, shortness of breath, palpitations, dizziness, fevers, chills, headache, nausea, vomiting, diarrhea. Because of his recurrent syncope and advanced age with multiple comorbidities, the patient was admitted for further evaluation and treatment.  Notably, the patient has had an extensive work-up for his syncope in the past.  He had a recent hospitalization from 01/06/2020 to 01/27/2020 in [facility].  01/11/2020 MRI of his brain at that time showed chronic bilateral cerebellar infarcts and chronic left thalamic hemorrhage.  01/06/2020 CTA of the head and neck showed his chronic mid basilar artery stenosis.  He was seen by  neurology who recommended the patient go home with aspirin 325 mg and continue his high-dose statin.  He was also seen by cardiology who felt that his syncope may have been related to the vasovagal or orthostasis.  Apparently a loop recorder was inserted during this hospitalization.  01/07/2020 echocardiogram showed EF 55-60% without shunt on a bubble study.  Aortic valve was poorly visualized but showed severe cuff calcification. Notably, the patient has had this history of basilar artery stenosis dating back to at least December 2018 when he suffered bilateral acute cerebellar infarcts.  It is unclear whether the patient was offered or evaluated for basilar artery stenting at that time.  He was seen by neurology who recommended aspirin and Plavix at that time for 3 months.  He has since been on aspirin monotherapy.  Since the initial event on December 2018 he had been evaluated by neurology on multiple occasions and his syncopal episodes were thought to be partly attributable to his basilar artery stenosis as well as orthostasis and volume depletion.  Assessment/Plan: Recurrent syncope -Check orthostatics -Cardiology consult -Neurology consult -Remain on telemetry -UDS negative -Urinalysis negative for pyuria -EEG -Echocardiogram  Transaminasemia -The patient has a history of alcohol use, but has quit 3 years ago -He had normal transaminases back on Feb 19, 2020 -RUQ ultrasound -Hepatitis B surface antigen -Hepatitis C antibody -He was noted to have dilated CBD during a previous hospitalization in [place] and MRCP was recommended, but the patient never followed up -Alcohol level undetectable on admission -GI consult  Bradycardia with pauses -Patient has had cardiology evaluation in the past -He has a loop recorder although it  is not clear exactly when this was placed -Cardiology evaluation -TSH  Basilar artery stenosis -As discussed above, the patient had a most recent CTA head and  neck for 1221 which revealed unchanged high-grade focal mid basilar artery stenosis -Continue aspirin for now -It is unclear whether he was ever offered or evaluated for possible stenting -This may certainly be contributing to the patient's recurrent syncope  Hematochezia/protoColitis -05/19/20 CT abd--Mild rectal wall thickening as well as mild wall thickening of the sigmoid colon with mild pericolonic edema, suspicious for proctocolitis -start ceftriaxone and metronidazole -GI consult -start IVF  CKD stage IIIa -Baseline creatinine 0.9-1.2 -A.m. BMP  Hyperlipidemia -Holding statin secondary to elevated LFTs  History of stroke -Most recent stroke 09/01/2017 -Continue aspirin -PT evaluation -Neurology consult -Optimize cardiovascular risk factors  Impaired glucose tolerance -06/04/2020 hemoglobin A1c 6.2 -Carb modified diet -Lifestyle modification  History of right lower extremity DVT -This was diagnosed on 05/13/2020 -He was prescribed rivaroxaban for 90 days  Thrombocytopenia -appears chronic -partly related to chronic Etoh use -TSH -B12 -Folate    Status is: Observation  The patient will require care spanning > 2 midnights and should be moved to inpatient because: Ongoing diagnostic testing needed not appropriate for outpatient work up  Dispo: The patient is from: Home              Anticipated d/c is to: Home              Anticipated d/c date is: 2 days              Patient currently is not medically stable to d/c.        Family Communication:   Updated daughter 9/10  Consultants:  Cardiology, neurology, GI  Code Status:  DNR--confirmed with patient  DVT Prophylaxis:  [place] Lovenox   Procedures: As Listed in Progress Note Above  Antibiotics: None   Total time spent 90 minutes.  Greater than 50% spent face to face counseling and coordinating care.     Subjective: Patient denies fevers, chills, headache, chest pain, dyspnea, nausea, vomiting,  diarrhea, abdominal pain, dysuria, hematuria, hematochezia, and melena.   Objective: Vitals:   06/04/20 2244 06/05/20 0042 06/05/20 0446 06/05/20 0447  BP: (!) 153/88 (!) 147/83 132/76   Pulse: 72 (!) 56 71   Resp:  18 18   Temp:  98.3 F (36.8 C) (!) 97.5 F (36.4 C)   TempSrc:  Oral Oral   SpO2:  97% 100%   Weight:    68.3 kg  Height:        Intake/Output Summary (Last 24 hours) at 06/05/2020 0737 Last data filed at 06/05/2020 0447 Gross per 24 hour  Intake 1256.13 ml  Output 800 ml  Net 456.13 ml   Weight change:  Exam:   General:  Pt is alert, follows commands appropriately, not in acute distress  HEENT: No icterus, No thrush, No neck mass, [place]/AT  Cardiovascular: RRR, S1/S2, no rubs, no gallops  Respiratory: CTA bilaterally, no wheezing, no crackles, no rhonchi  Abdomen: Soft/+BS, non tender, non distended, no guarding  Extremities: No edema, No lymphangitis, No petechiae, No rashes, no synovitis  Neuro:  CN II-XII intact, strength 4/5 in RUE, RLE, strength 4/5 LUE, LLE; sensation intact bilateral; no dysmetria; babinski equivocal     Data Reviewed: I have personally reviewed following labs and imaging studies Basic Metabolic Panel: Recent Labs  Lab 06/04/20 1248 06/04/20 1257 06/05/20 0425  NA 140 138 138  K 5.0 4.1  4.1  CL 107 106 105  CO2  --  25 25  GLUCOSE 131* 127* 88  BUN CREATININE 1.50* 1.28* 1.09  CALCIUM  --  9.1 9.2  MG  --  2.0  --    Liver Function Tests: Recent Labs  Lab 06/04/20 1257 06/05/20 0425  AST 269* 312*  ALT 308* 339*  ALKPHOS 479* 507*  BILITOT 0.8 1.1  PROT 7.1 7.4  ALBUMIN 3.5 3.6   No results for input(s): LIPASE, AMYLASE in the last 168 hours. No results for input(s): AMMONIA in the last 168 hours. Coagulation Profile: Recent Labs  Lab 06/04/20 1257  INR 1.0   CBC: Recent Labs  Lab 06/04/20 1248 06/04/20 1257  WBC  --  6.8  NEUTROABS  --  4.3  HGB 14.6 14.1  HCT 43.0 43.9  MCV  --   93.0  PLT  --  126*   Cardiac Enzymes: Recent Labs  Lab 06/04/20 1257  CKTOTAL 171   BNP: Invalid input(s): POCBNP CBG: Recent Labs  Lab 06/05/20 0444  GLUCAP 93   HbA1C: Recent Labs    06/04/20 1257  HGBA1C 6.2*   Urine analysis:    Component Value Date/Time   COLORURINE YELLOW 06/04/2020 1436   APPEARANCEUR HAZY (A) 06/04/2020 1436   LABSPEC 1.010 06/04/2020 1436   PHURINE 5.0 06/04/2020 1436   GLUCOSEU NEGATIVE 06/04/2020 1436   HGBUR NEGATIVE 06/04/2020 1436   BILIRUBINUR NEGATIVE 06/04/2020 1436   KETONESUR NEGATIVE 06/04/2020 1436   PROTEINUR NEGATIVE 06/04/2020 1436   NITRITE NEGATIVE 06/04/2020 1436   LEUKOCYTESUR NEGATIVE 06/04/2020 1436   Sepsis Labs: (procalcitonin:4,lacticidven:4) ) Recent Results (from the past 240 hour(s))  SARS Coronavirus 2 by RT PCR (hospital order, performed in [facility] hospital lab) Nasopharyngeal Nasopharyngeal Swab     Status: None   Collection Time: 06/04/20 12:19 PM   Specimen: Nasopharyngeal Swab  Result Value Ref Range Status   SARS Coronavirus 2 NEGATIVE NEGATIVE Final    Comment: (NOTE) SARS-CoV-2 target nucleic acids are NOT DETECTED.  The SARS-CoV-2 RNA is generally detectable in upper and lower respiratory specimens during the acute phase of infection. The lowest concentration of SARS-CoV-2 viral copies this assay can detect is 250 copies / mL. A negative result does not preclude SARS-CoV-2 infection and should not be used as the sole basis for treatment or other patient management decisions.  A negative result may occur with improper specimen collection / handling, submission of specimen other than nasopharyngeal swab, presence of viral mutation(s) within the areas targeted by this assay, and inadequate number of viral copies (<250 copies / mL). A negative result must be combined with clinical observations, patient history, and epidemiological information.  Fact Sheet for Patients:    BoilerBrush.com.cy  Fact Sheet for Healthcare Providers: https://pope.com/  This test is not yet approved or  cleared by the [place] FDA and has been authorized for detection and/or diagnosis of SARS-CoV-2 by FDA under an Emergency Use Authorization (EUA).  This EUA will remain in effect (meaning this test can be used) for the duration of the COVID-19 declaration under Section 564(b)(1) of the Act, 21 U.S.C. section 360bbb-3(b)(1), unless the authorization is terminated or revoked sooner.  Performed at [facility], [address], [place], [place] [address]      Scheduled Meds: . aspirin EC  81 mg Oral Daily  . atorvastatin  80 mg Oral Daily  . docusate sodium  100 mg Oral BID  . enoxaparin (  LOVENOX) injection  40 mg Subcutaneous Q24H  . midodrine  5 mg Oral BID WC  . pantoprazole  40 mg Oral Daily  . sodium chloride flush  3 mL Intravenous Q12H   Continuous Infusions:  Procedures/Studies: X-ray chest PA and lateral  Result Date: 06/04/2020 CLINICAL DATA:  Syncope EXAM: CHEST - 2 VIEW COMPARISON:  None. FINDINGS: Left chest electronic recording device. No consolidation or effusion. Cardiac size within normal limits. Aortic atherosclerosis with mild unfolding of aorta. No pneumothorax. IMPRESSION: No active cardiopulmonary disease. Electronically Signed   By: Jasmine Pang M.D.   On: 06/04/2020 17:47   CT HEAD WO CONTRAST  Result Date: 06/04/2020 CLINICAL DATA:  TIA symptoms with weakness EXAM: CT HEAD WITHOUT CONTRAST TECHNIQUE: Contiguous axial images were obtained from the base of the skull through the vertex without intravenous contrast. COMPARISON:  None FINDINGS: Brain: There is moderate diffuse atrophy, stable. There is no intracranial mass, hemorrhage, extra-axial fluid collection, or midline shift. There is patchy small vessel disease in the centra semiovale bilaterally. There is evidence of a prior infarct in the  posterior mid left cerebellum. There is a second prior infarct laterally in the mid to upper right cerebellum. Small vessel disease is noted in each lateral thalamus. No acute infarct is evident on this study. Vascular: No hyperdense vessel. There is calcification in each carotid siphon region. Skull: The bony calvarium appears intact. Sinuses/Orbits: There is mucosal thickening involving several ethmoid air cells. Other visualized paranasal sinuses are clear. Visualized orbits appear symmetric bilaterally. Other: There is opacification in several mastoid air cells bilaterally. There is debris in the left external auditory canal. IMPRESSION: Moderate atrophy with supratentorial small vessel disease. Prior infarcts in each cerebellum, chronic. No acute infarct demonstrable. No mass or hemorrhage. There are foci of arterial vascular calcification bilaterally. Mucosal thickening noted in several ethmoid air cells. Opacification in several mastoid air cells bilaterally. Probable cerumen in left external auditory canal. Electronically Signed   By: Bretta Bang III M.D.   On: 06/04/2020 13:16   CT Abdomen Pelvis W Contrast  Result Date: 05/19/2020 CLINICAL DATA:  Abdominal abscess/infection suspected Rectal pain ? Perirectal abscess EXAM: CT ABDOMEN AND PELVIS WITH CONTRAST TECHNIQUE: Multidetector CT imaging of the abdomen and pelvis was performed using the standard protocol following bolus administration of intravenous contrast. CONTRAST:  OMNIPAQUE IOHEXOL 300 MG/ML  SOLN COMPARISON:  None. FINDINGS: Lower chest: 4 mm subpleural nodule in the left lower lobe, series 4, image 5. Atelectasis in the medial right lung base. No pleural fluid. Heart is normal in size. Hepatobiliary: Mild motion artifact through the liver and gallbladder. No evidence of focal hepatic abnormality. Proximal common bile duct is dilated measuring 12 mm with tapering distally to the duodenal insertion. No calcified gallstones. No  pericholecystic inflammation. Pancreas: Slight prominence of the proximal pancreatic duct measuring 4 mm. No peripancreatic inflammation. No visualized pancreatic mass, there is mild motion artifact through the head and uncinate process. Spleen: Normal in size without focal abnormality. Adrenals/Urinary Tract: No adrenal nodule. No hydronephrosis or perinephric edema. Homogeneous renal enhancement with symmetric excretion on delayed phase imaging. Tiny cortical hypodensity in the upper right kidney is too small to accurately characterize but likely cyst. Urinary bladder is partially distended, there is mint posterior bladder wall thickening. Stomach/Bowel: Ill-defined soft tissue thickening involving the inferior aspect of the left buttock with adjacent fat stranding, only partially included on the extended field of view imaging. No well-defined fluid collection. No definite connection to the  in the rectum. No definite perirectal inflammation or fluid collection. There is mild rectal wall thickening that is nonspecific. Mild wall thickening of the sigmoid colon with mild pericolonic edema, series 2, image 47. sigmoid colon is redundant. Diverticulosis in the Ling the descending colon without diverticulitis. Moderate colonic stool burden. The appendix is not confidently visualized. Scattered fluid-filled but nondilated small bowel in a nonobstructive pattern. The stomach is decompressed. Detailed bowel evaluation is limited in the absence of enteric contrast and paucity of intra-abdominal fat. Vascular/Lymphatic: Aortic and branch atherosclerosis. Iliac vessels are tortuous. No aortic aneurysm. Few prominent iliac lymph nodes are likely reactive, not enlarged by size criteria. No bulky abdominopelvic adenopathy. Reproductive: Prominent prostate gland spanning 4.8 cm. Other: No intra-abdominopelvic fluid collection or abscess. No ascites. No free air. There is a small fat containing umbilical hernia. Musculoskeletal:  Diffuse degenerative change in the spine. Mild L1 superior endplate compression fracture is likely chronic. No evidence of focal bone lesion. IMPRESSION: 1. Ill-defined soft tissue thickening involving the inferior aspect of the left buttock with adjacent fat stranding, only partially included on the extended field of view. No well-defined fluid collection. No definite connection to the anorectum. 2. Mild rectal wall thickening as well as mild wall thickening of the sigmoid colon with mild pericolonic edema, suspicious for proctocolitis. 3. Colonic diverticulosis without diverticulitis. 4. Proximal common bile duct dilatation measuring 12 mm with tapering distally to the duodenal insertion. No calcified gallstones or pericholecystic inflammation. Recommend correlation with LFTs. If LFTs are elevated, recommend further evaluation with MRCP on an elective basis. 5. Mild nonspecific bladder wall thickening. 6. Small fat containing umbilical hernia. 7. A 4 mm subpleural nodule in the left lower lobe. No follow-up needed if patient is low-risk. Non-contrast chest CT can be considered in 12 months if patient is high-risk. This recommendation follows the consensus statement: Guidelines for Management of Incidental Pulmonary Nodules Detected on CT Images: From the Fleischner Society 2017; Radiology 2017; 284:228-243. 8. Mild L1 superior endplate compression fracture, likely chronic. Aortic Atherosclerosis (ICD10-I70.0). Electronically Signed   By: Narda Rutherford M.D.   On: 05/19/2020 15:56    Catarina Hartshorn, DO  [facility]  If 7PM-7AM, please contact night-coverage [email or web address] Password [email or web address] 06/05/2020, 7:37 AM   LOS: 0 days

## 2020-06-05 NOTE — Progress Notes (Signed)
° ° ° ° °  Pt had Biotronik loop recorder interrogated on day of syncope He had 42 sec of HR at 36, difficult to tell if sinus or Junctional.  Also with other brief episodes in 30s and 40s occ PACs and PVCs.  Not clear on timing with the bradycardia to syncope.  Will need to continue telemetry   He also has severe AS though again not clear if associated with his syncope.  We will plan further work up as outpt.

## 2020-06-05 NOTE — Plan of Care (Signed)
  Problem: Acute Rehab PT Goals(only PT should resolve) Goal: Patient Will Transfer Sit To/From Stand Outcome: Progressing Flowsheets (Taken 06/05/2020 0851) Patient will transfer sit to/from stand: with modified independence Goal: Pt Will Transfer Bed To Chair/Chair To Bed Outcome: Progressing Flowsheets (Taken 06/05/2020 0851) Pt will Transfer Bed to Chair/Chair to Bed: with supervision Goal: Pt Will Ambulate Outcome: Progressing Flowsheets (Taken 06/05/2020 0851) Pt will Ambulate:  > 125 feet  with supervision  with rolling walker Goal: Pt/caregiver will Perform Home Exercise Program Outcome: Progressing Flowsheets (Taken 06/05/2020 0851) Pt/caregiver will Perform Home Exercise Program:  For increased strengthening  For improved balance  With Supervision, verbal cues required/provided  8:52 AM, 06/05/20 Wyman Songster PT, DPT Physical Therapist at [facility]

## 2020-06-05 NOTE — Op Note (Signed)
[facility] Patient Name: Charles Morgan Procedure Date: 06/05/2020 2:27 PM MRN: [id number] Date of Birth: [birth year] Attending MD: Lionel December , MD CSN: [id number] Age: 86 Admit Type: Inpatient Procedure:                Flexible Sigmoidoscopy Indications:              Abnormal CT of the GI tract; wall thickening to                            rectum and sigmoid colon. Providers:                Lionel December, MD, Crystal Page, Dyann Ruddle Referring MD:             Catarina Hartshorn, DO Medicines:                Midazolam 2 mg IV Complications:            No immediate complications. Estimated Blood Loss:     Estimated blood loss: none. Procedure:                Pre-Anesthesia Assessment:                           - Prior to the procedure, a History and Physical                            was performed, and patient medications and                            allergies were reviewed. The patient's tolerance of                            previous anesthesia was also reviewed. The risks                            and benefits of the procedure and the sedation                            options and risks were discussed with the patient.                            All questions were answered, and informed consent                            was obtained. Prior Anticoagulants: The patient                            last took Lovenox (enoxaparin) 1 day prior to the                            procedure and last took antiplatelet medication on                            the day of the procedure. ASA Grade Assessment: III                            -  A patient with severe systemic disease. After                            reviewing the risks and benefits, the patient was                            deemed in satisfactory condition to undergo the                            procedure.                           After obtaining informed consent, the scope was                            passed under  direct vision. The PCF-[id number]                            ([id number]) scope was introduced through the anus and                            advanced to the the sigmoid colon. The flexible                            sigmoidoscopy was accomplished without difficulty.                            The patient tolerated the procedure well. The                            quality of the bowel preparation was excellent. Scope In: 3:46:53 PM Scope Out: 3:50:29 PM Total Procedure Duration: 0 hours 3 minutes 36 seconds  Findings:      The perianal and digital rectal examinations were normal.      A moderate amount of solid stool was found in the proximal sigmoid colon       and in the mid sigmoid colon.      A single small-mouthed diverticulum was found in the mid sigmoid colon.      The rectum, mid sigmoid colon and distal sigmoid colon appeared normal.      Internal hemorrhoids were found during retroflexion. The hemorrhoids       were small. Impression:               - Stool in the proximal sigmoid colon and in the                            mid sigmoid colon.                           - Diverticulosis in the mid sigmoid colon.                           - The rectum, mid sigmoid colon and distal sigmoid  colon are normal.                           - Internal hemorrhoids.                           - No specimens collected. Moderate Sedation:      Moderate (conscious) sedation was administered by the endoscopy nurse       and supervised by the endoscopist. The following parameters were       monitored: oxygen saturation, heart rate, blood pressure, CO2       capnography and response to care. Total physician intraservice time was       7 minutes. Recommendation:           - Return patient to hospital ward for ongoing care.                           - Low sodium diet today.                           - Continue present medications.                           - Miralax 17 g po  qhs. Procedure Code(s):        --- Professional ---                           [phone], Sigmoidoscopy, flexible; diagnostic,                            including collection of specimen(s) by brushing or                            washing, when performed (separate procedure) Diagnosis Code(s):        --- Professional ---                           K64.8, Other hemorrhoids                           K57.30, Diverticulosis of large intestine without                            perforation or abscess without bleeding                           R93.3, Abnormal findings on diagnostic imaging of                            other parts of digestive tract CPT copyright 2019 American Medical Association. All rights reserved. The codes documented in this report are preliminary and upon coder review may  be revised to meet current compliance requirements. Lionel December, MD Lionel December, MD 06/05/2020 4:04:06 PM This report has been signed electronically. Number of Addenda: 0

## 2020-06-05 NOTE — Progress Notes (Signed)
MRCP may or may not be done depending on the model of loop recorder that he has. I contacted physician's office in [facility] but unable to get the specific information. I have called his physician at the [facility] and waiting for the answer.  [phone] We will proceed with diagnostic flexible sigmoidoscopy. I have talked with patient's daughter Gavin Pound as well as patient and they are both agreeable.  She does not have power of attorney up to this point.

## 2020-06-05 NOTE — Evaluation (Signed)
Physical Therapy Evaluation Patient Details Name: Charles Morgan MRN: [id number] DOB: 1930-03-13 Today's Date: 06/05/2020   History of Present Illness  Charles Morgan is a 86 y.o. male with medical history significant of vertebrobasilar insufficiency, multiple episodes of syncope, h/o CVAs, AoV Calcification/stenosis, hyperlipidemia. Patient lives in [place]. Was able to access and review [facility] records, [place]. Patient has had increasing debility with gait disturbance and need for assistance with ADLs to the point where his long estranged daughter has reengaged and has become his primary care giver. They are in [place] visiting family with plans to move here from [place]. On the day of admission his daughter found him unconscious and unresponsive for several minutes until EMS arrived. He was transported to [facility] for further evaluation    Clinical Impression  Patient functioning near/at baseline for functional mobility and gait but limited for functional mobility as stated below secondary to BLE weakness, fatigue and impaired standing balance. Patient transitions to standing from chair with RW and is slightly unsteady upon initial standing. Patient ambulates with RW with decreased cadence and impaired foot clearance without loss of balance. Patient with moderate unsteadiness with turning while ambulating and bumped RW into several objects while ambulating in hallway. Patient returned to room and shows impaired eccentric control with transfer from stand to sit in chair at bedside. Patient shows good sitting tolerance and is left sitting in chair at end of session.  Patient will benefit from continued physical therapy in hospital and recommended venue below to increase strength, balance, endurance for safe ADLs and gait.     Follow Up Recommendations Home health PT;Supervision/Assistance - 24 hour    Equipment Recommendations  None recommended by PT    Recommendations for Other Services        Precautions / Restrictions Precautions Precautions: Fall Restrictions Weight Bearing Restrictions: No      Mobility  Bed Mobility Overal bed mobility: Modified Independent             General bed mobility comments: Patient seated in chair at beginning of session  Transfers Overall transfer level: Needs assistance Equipment used: Rolling walker (2 wheeled) Transfers: Sit to/from [employer] Transfers Sit to Stand: Supervision;Min guard Stand pivot transfers: Supervision;Min guard       General transfer comment: with RW, unsteady upon intial standing, decreased eccentric control from stand to sit  Ambulation/Gait Ambulation/Gait assistance: Supervision;Min guard Gait Distance (Feet): 100 Feet Assistive device: Rolling walker (2 wheeled) Gait Pattern/deviations: Decreased step length - right;Decreased step length - left;Decreased stride length Gait velocity: decreased   General Gait Details: ambulates with decreased cadence with decreased foot clearance bilateral with RW, unsteady with turning  Stairs            Wheelchair Mobility    Modified Rankin (Stroke Patients Only)       Balance Overall balance assessment: Needs assistance Sitting-balance support: Feet supported Sitting balance-Leahy Scale: Good Sitting balance - Comments: seated in chair   Standing balance support: Bilateral upper extremity supported Standing balance-Leahy Scale: Good Standing balance comment: good/fair with RW                             Pertinent Vitals/Pain Pain Assessment: No/denies pain    Home Living Family/patient expects to be discharged to:: Private residence Living Arrangements: Children Available Help at Discharge: Family;Available 24 hours/day Type of Home: House       Home Layout: One level Home Equipment: Dan Humphreys -  2 wheels;Shower seat;Wheelchair - manual;Hospital bed      Prior Function Level of Independence: Needs assistance    Gait / Transfers Assistance Needed: Pt uses no ADor  RW for mobility, has hx of frequent falls  ADL's / Homemaking Assistance Needed: Daughter assists with meal prep and housekeeping; pt reports independence in basic ADLs        Hand Dominance   Dominant Hand: Right    Extremity/Trunk Assessment   Upper Extremity Assessment Upper Extremity Assessment: Defer to OT evaluation    Lower Extremity Assessment Lower Extremity Assessment: Overall WFL for tasks assessed    Cervical / Trunk Assessment Cervical / Trunk Assessment: Normal  Communication   Communication: No difficulties  Cognition Arousal/Alertness: Awake/alert Behavior During Therapy: WFL for tasks assessed/performed Overall Cognitive Status: Within Functional Limits for tasks assessed                                        General Comments      Exercises     Assessment/Plan    PT Assessment Patient needs continued PT services  PT Problem List Decreased strength;Decreased mobility;Decreased activity tolerance;Decreased balance       PT Treatment Interventions DME instruction;Therapeutic exercise;Gait training;Balance training;Stair training;Neuromuscular re-education;Functional mobility training;Therapeutic activities;Patient/family education    PT Goals (Current goals can be found in the Care Plan section)  Acute Rehab PT Goals Patient Stated Goal: Return home with daughter to assist PT Goal Formulation: With patient Time For Goal Achievement: 06/12/20 Potential to Achieve Goals: Good    Frequency Min 3X/week   Barriers to discharge        Co-evaluation               AM-PAC PT "6 Clicks" Mobility  Outcome Measure Help needed turning from your back to your side while in a flat bed without using bedrails?: None Help needed moving from lying on your back to sitting on the side of a flat bed without using bedrails?: None Help needed moving to and from a bed to a chair  (including a wheelchair)?: A Little Help needed standing up from a chair using your arms (e.g., wheelchair or bedside chair)?: A Little Help needed to walk in hospital room?: A Little Help needed climbing 3-5 steps with a railing? : A Lot 6 Click Score: 19    End of Session Equipment Utilized During Treatment: Gait belt Activity Tolerance: Patient tolerated treatment well Patient left: in chair;with call bell/phone within reach;with chair alarm set Nurse Communication: Mobility status PT Visit Diagnosis: Unsteadiness on feet (R26.81);Other abnormalities of gait and mobility (R26.89);Muscle weakness (generalized) (M62.81)    Time: 3267-1245 PT Time Calculation (min) (ACUTE ONLY): 12 min   Charges:   PT Evaluation $PT Eval Low Complexity: 1 Low          8:50 AM, 06/05/20 Wyman Songster PT, DPT Physical Therapist at [facility]

## 2020-06-06 DIAGNOSIS — I651 Occlusion and stenosis of basilar artery: Secondary | ICD-10-CM | POA: Diagnosis not present

## 2020-06-06 LAB — CBC
HCT: 40.6 % (ref 39.0–52.0)
Hemoglobin: 12.9 g/dL — ABNORMAL LOW (ref 13.0–17.0)
MCH: 28.9 pg (ref 26.0–34.0)
MCHC: 31.8 g/dL (ref 30.0–36.0)
MCV: 90.8 fL (ref 80.0–100.0)
Platelets: 124 10*3/uL — ABNORMAL LOW (ref 150–400)
RBC: 4.47 MIL/uL (ref 4.22–5.81)
RDW: 14.6 % (ref 11.5–15.5)
WBC: 6.4 10*3/uL (ref 4.0–10.5)
nRBC: 0 % (ref 0.0–0.2)

## 2020-06-06 LAB — COMPREHENSIVE METABOLIC PANEL
ALT: 302 U/L — ABNORMAL HIGH (ref 0–44)
AST: 261 U/L — ABNORMAL HIGH (ref 15–41)
Albumin: 3.2 g/dL — ABNORMAL LOW (ref 3.5–5.0)
Alkaline Phosphatase: 453 U/L — ABNORMAL HIGH (ref 38–126)
Anion gap: 10 (ref 5–15)
BUN: 13 mg/dL (ref 8–23)
CO2: 20 mmol/L — ABNORMAL LOW (ref 22–32)
Calcium: 8.5 mg/dL — ABNORMAL LOW (ref 8.9–10.3)
Chloride: 107 mmol/L (ref 98–111)
Creatinine, Ser: 0.93 mg/dL (ref 0.61–1.24)
GFR calc Af Amer: >60 mL/min (ref >60)
GFR calc non Af Amer: >60 mL/min (ref >60)
Glucose, Bld: 88 mg/dL (ref 70–99)
Potassium: 3.6 mmol/L (ref 3.5–5.1)
Sodium: 137 mmol/L (ref 135–145)
Total Bilirubin: 1.1 mg/dL (ref 0.3–1.2)
Total Protein: 6.4 g/dL — ABNORMAL LOW (ref 6.5–8.1)

## 2020-06-06 LAB — GLUCOSE, CAPILLARY
Glucose-Capillary: 89 mg/dL (ref 70–99)
Glucose-Capillary: 83 mg/dL (ref 70–99)
Glucose-Capillary: 103 mg/dL — ABNORMAL HIGH (ref 70–99)

## 2020-06-06 LAB — SEDIMENTATION RATE: Sed Rate: 11 mm/hr (ref 0–16)

## 2020-06-06 MED ORDER — POLYETHYLENE GLYCOL 3350 17 G PO PACK: 17.0000 g | PACK | Freq: Every day | ORAL | Status: DC

## 2020-06-06 MED ORDER — ATORVASTATIN CALCIUM 80 MG PO TABS: 80.0000 mg | ORAL_TABLET | Freq: Every day | ORAL | Status: DC

## 2020-06-06 NOTE — Discharge Summary (Signed)
Physician Discharge Summary  Charles Morgan OXB:[id number] DOB: [birth year] DOA: 06/04/2020  PCP: Patient, No Pcp Per  Admit date: 06/04/2020 Discharge date: 06/06/2020  Admitted From: Home Discharge disposition: Home with outpatient rehab   Code Status: DNR  Diet Recommendation: Cardiac diet  Discharge Diagnosis:   Active Problems:   Syncope   Dehydration   AKI (acute kidney injury) (HCC)   HLD (hyperlipidemia)   Calcification of aortic valve   Syncope and collapse   CKD (chronic kidney disease) stage 3, GFR 30-59 ml/min   Basilar artery stenosis   Transaminasemia   Thrombocytopenia (HCC)   History of Present Illness / Brief narrative:  Charles Morgan is a 86 y.o. male with PMH of recurrent syncope, vertebrobasilar insufficiency, autonomic dysfunction, right lower external DVT, stroke.  Patient recently moved to [place] from [place] where he had an extensive work-up for recurrent syncope. Patient presented to the ED on 06/04/2020 with a yet another episode of syncope.  Patient recalls sitting on a chair and the next thing he remembers is being in the emergency room.  Denies any premonition symptoms.  However, his daughter recalls him complaining of some weakness in both hands and feeling "funny" before he slumped over. No convulsions or incontinence. He woke up with EMS but still was somewhat confused. Subsequently brought to ED.   Patient was admitted under hospitalist service for further evaluation management. Seen by cardiology and neurology.  Subjective:  Seen and examined this morning.  Pleasant elderly African-American male.  Lying on bed.  Not in distress.  Wants to go home.  Hospital Course:  Recurrent syncope Multiple chronic intracranial arterial stenosis History of stroke -In 2018, patient had bilateral acute cerebellar infarcts.  Imaging at the time showed basilar artery stenosis.  He was placed on aspirin and Plavix for 3 months followed by aspirin alone.  Since the initial event on December 2018 he had been 01/06/2020 to 01/27/2020 - hospitalized at [facility]. MRI of his brain at that time showed chronic bilateral cerebellar infarcts and chronic left thalamic hemorrhage.  Echo with EF 55 to 60% without of shunt.  A loop recorder was inserted. He was seen by neurology who started him on aspirin 325 mg and statin.  -This admission, patient was brought in with yet another episode of syncope.  Has a history of recurrent syncope in the past. -evaluated by neurology on multiple occasions and his syncopal episodes were thought to be partly attributable to his basilar artery stenosis as well as orthostasis and volume depletion. -orthostatic vital signs were negative. -MRI brain did not show any acute abnormality and confirmed chronic findings. -Seen by cardiology. -Pt had Biotronik loop recorder interrogated on day of syncope. He had 42 sec of HR at 36, difficult to tell if sinus or Junctional. Also with other brief episodes in 30s and 40s occ PACs and PVCs. Not clear on timing with the bradycardia to syncope.  -Telemetry monitoring did not show any arrhythmia -EEG normal  Severe aortic stenosis -Echocardiogram showed severe aortic stenosis. -Per cardiology, it is not clear if severe AS is related to recurrent syncope. -Recommended follow-up as an outpatient.  Elevated liver enzymes History of alcohol use -quit 3 years ago -GI consultation was obtained.   -MRCP did not show any acute abnormality.  Per GI, patient might have had a biliary stone which she has passed.   -Liver enzymes improving.  Patient follows up with gastroenterologist as an outpatient.  Repeat LFT as an outpatient. Recent Labs  Lab 06/04/20 1257 06/05/20 0425 06/05/20 1647 06/06/20 0802  AST 269* 312* 280* 261*  ALT 308* 339* 310* 302*  ALKPHOS 479* 507* 447* 453*  BILITOT 0.8 1.1 0.8 1.1  PROT 7.1 7.4 6.6 6.4*  ALBUMIN 3.5 3.6 3.2* 3.2*   Hepatitis Latest Ref Rng &  Units 06/05/2020  Hep B Surface Ag NON REACTIVE NON REACTIVE  Hep C Ab NON REACTIVE NON REACTIVE   Hematochezia/proctoColitis -05/19/20 CT abd--Mild rectal wall thickening as well as mild wall thickening of the sigmoid colon with mild pericolonic edema, suspicious for proctocolitis -GI did a flexible sigmoidoscopy.  A small diverticulum of the sigmoid colon was noted.  Mucosa of sigmoid colon and rectum were normal.  Small internal hemorrhoids noted.  Acute kidney injury on CKD stage IIIa -Baseline creatinine 0.9-1.2 -Creatinine was elevated to 1.5 on presentation.  Improved with hydration. Recent Labs    05/19/20 1314 06/04/20 1248 06/04/20 1257 06/05/20 0425 06/06/20 0802  BUN 19 19 16 18 13   CREATININE 0.97 1.50* 1.28* 1.09 0.93   Prediabetes -Advised dietary control Lab Results  Component Value Date   HGBA1C 6.2 (H) 06/04/2020   Recent Labs  Lab 06/05/20 0444 06/06/20 0546 06/06/20 0757 06/06/20 1135  GLUCAP 93 89 83 103*   History of right lower extremity DVT -Per daughter, completed 51-month course of Xarelto in 2020.    Stable discharge home today. I called and updated patient's daughter Ms. Carolyn Stare.  She plans to take him to [place] for an outpatient appointment in next few days.  Patient also asked me for the list of PCPs in town.  Case manager gave him the list.  Wound care:    Discharge Exam:   Vitals:   06/05/20 1555 06/05/20 2100 06/06/20 0500 06/06/20 0800  BP: 128/72   127/80  Pulse: (!) 50   (!) 52  Resp: 14 16  16   Temp:  (!) 97.3 F (36.3 C)  98.1 F (36.7 C)  TempSrc:  Oral  Oral  SpO2: 100% 100%  100%  Weight:   68.3 kg   Height:        Body mass index is 23.58 kg/m.  General exam: Appears calm and comfortable.  Not in physical distress Skin: No rashes, lesions or ulcers. HEENT: Atraumatic, normocephalic, supple neck, no obvious bleeding Lungs: Clear to auscultate bilaterally CVS: Regular rate and rhythm, ejection systolic  murmur GI/Abd soft, nontender, nondistended bowels are present CNS: Alert, awake, oriented x3 Psychiatry: Mood appropriate Extremities: No pedal edema, no calf tenderness  Follow ups:   Discharge Instructions    Ambulatory referral to Physical Therapy   Complete by: As directed    Diet - low sodium heart healthy   Complete by: As directed    Increase activity slowly   Complete by: As directed       Follow-up Information    [facility] Follow up.   Contact information: [address] [place] [address] [phone]              Recommendations for Outpatient Follow-Up:   1. Follow-up with PCP as an outpatient  Discharge Instructions:  Follow with Primary MD Patient, No Pcp Per in 7 days   Get CBC/CMP checked in next visit within 1 week by PCP or SNF MD ( we routinely change or add medications that can affect your baseline labs and fluid status, therefore we recommend that you get the mentioned basic workup next visit with  your PCP, your PCP may decide not to get them or add new tests based on their clinical decision)  On your next visit with your PCP, please Get Medicines reviewed and adjusted.  Please request your PCP  to go over all Hospital Tests and Procedure/Radiological results at the follow up, please get all Hospital records sent to your Prim MD by signing hospital release before you go home.  Activity: As tolerated with Full fall precautions use walker/cane & assistance as needed  For Heart failure patients - Check your Weight same time everyday, if you gain over 2 pounds, or you develop in leg swelling, experience more shortness of breath or chest pain, call your Primary MD immediately. Follow Cardiac Low Salt Diet and 1.5 lit/day fluid restriction.  If you have smoked or chewed Tobacco in the last 2 yrs please stop smoking, stop any regular Alcohol  and or any Recreational drug use.  If you experience  worsening of your admission symptoms, develop shortness of breath, life threatening emergency, suicidal or homicidal thoughts you must seek medical attention immediately by calling 911 or calling your MD immediately  if symptoms less severe.  You Must read complete instructions/literature along with all the possible adverse reactions/side effects for all the Medicines you take and that have been prescribed to you. Take any new Medicines after you have completely understood and accpet all the possible adverse reactions/side effects.   Do not drive, operate heavy machinery, perform activities at heights, swimming or participation in water activities or provide baby sitting services if your were admitted for syncope or siezures until you have seen by Primary MD or a Neurologist and advised to do so again.  Do not drive when taking Pain medications.  Do not take more than prescribed Pain, Sleep and Anxiety Medications  Wear Seat belts while driving.   Please note You were cared for by a hospitalist during your hospital stay. If you have any questions about your discharge medications or the care you received while you were in the hospital after you are discharged, you can call the unit and asked to speak with the hospitalist on call if the hospitalist that took care of you is not available. Once you are discharged, your primary care physician will handle any further medical issues. Please note that NO REFILLS for any discharge medications will be authorized once you are discharged, as it is imperative that you return to your primary care physician (or establish a relationship with a primary care physician if you do not have one) for your aftercare needs so that they can reassess your need for medications and monitor your lab values.    Allergies as of 06/06/2020      Reactions   Tuberculin Rash      Medication List    TAKE these medications   aspirin EC 81 MG tablet Take 81 mg by mouth daily.  Swallow whole.   atorvastatin 80 MG tablet Commonly known as: LIPITOR Take 1 tablet (80 mg total) by mouth daily. What changed: when to take this   docusate sodium 100 MG capsule Commonly known as: COLACE Take 100 mg by mouth 2 (two) times daily.       Time coordinating discharge: 35 minutes  The results of significant diagnostics from this hospitalization (including imaging, microbiology, ancillary and laboratory) are listed below for reference.    Procedures and Diagnostic Studies:   MR BRAIN WO CONTRAST  Result Date: 06/05/2020 CLINICAL DATA:  86 year old male with weakness  and confusion. EXAM: MRI HEAD WITHOUT CONTRAST TECHNIQUE: Multiplanar, multiecho pulse sequences of the brain and surrounding structures were obtained without intravenous contrast. COMPARISON:  Head CT yesterday. FINDINGS: Brain: No restricted diffusion or evidence of acute infarction. Multiple chronic infarcts in the bilateral cerebellum, thalami (chronic hemorrhagic lacune on the left). Patchy and widespread bilateral cerebral white matter T2 and FLAIR hyperintensity. No midline shift, mass effect, evidence of mass lesion, ventriculomegaly, extra-axial collection or acute intracranial hemorrhage. Cervicomedullary junction and pituitary are within normal limits. No cerebral cortical encephalomalacia identified. 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 Vascular: Major intracranial vascular flow voids are preserved, although both distal vertebral arteries appear abnormally decreased and heterogeneous. Skull and upper cervical spine: Partially visible cervical spine degeneration including mild degenerative spinal stenosis at C3-C4 with mild cord mass effect (series 9, image 11). Visualized bone marrow signal is within normal limits. Sinuses/Orbits: Postoperative changes to both globes, otherwise negative orbits. Paranasal sinuses remain clear. Other: Mild right mastoid effusion. Negative  nasopharynx. Left mastoids are clear. Grossly normal other visible internal auditory structures. Visible scalp and face appear negative. IMPRESSION: 1. No acute intracranial abnormality. 2. Advanced chronic ischemic disease, most pronounced in the cerebellum, and with evidence of hemodynamically significant stenosis of both distal vertebral arteries. 3. Partially visible cervical spine degeneration with mild spinal stenosis and cord mass effect at C3-C4. Electronically Signed   By: Odessa FlemingH  Hall M.D.   On: 06/05/2020 14:37   MR 3D Recon At Scanner  Result Date: 06/05/2020 CLINICAL DATA:  Elevated liver function studies. EXAM: MRI ABDOMEN WITHOUT AND WITH CONTRAST (INCLUDING MRCP) TECHNIQUE: Multiplanar multisequence MR imaging of the abdomen was performed both before and after the administration of intravenous contrast. Heavily T2-weighted images of the biliary and pancreatic ducts were obtained, and three-dimensional MRCP images were rendered by post processing. CONTRAST:  6mL GADAVIST GADOBUTROL 1 MMOL/ML IV SOLN COMPARISON:  CT scan 05/19/2020 FINDINGS: Examination is somewhat limited by breathing motion artifact. Lower chest: The lungs are grossly clear. No worrisome pulmonary lesions. No pleural or pericardial effusion. Heart is within normal limits in size for the patient's age. Hepatobiliary: No hepatic lesions are identified. The gallbladder is unremarkable. No gallstones, wall thickening or pericholecystic fluid. No intrahepatic biliary dilatation. The common bile duct is within normal limits in caliber given the patient's age. It measures approximately 7.5 mm in the porta hepatis and 7.5 mm in the head of the pancreas. It tapers normally to the ampulla. No common bile duct stones are identified. Pancreas:  No mass, inflammation or ductal dilatation. Spleen:  Normal size.  No focal lesions. Adrenals/Urinary Tract: The adrenal glands and kidneys are unremarkable. No worrisome renal lesions or hydronephrosis.  Stomach/Bowel: The stomach, duodenum, visualized small bowel and visualized colon are grossly normal. Vascular/Lymphatic: The aorta is normal in caliber. The branch vessels are patent. No mesenteric or retroperitoneal mass or adenopathy. Other:  No ascites or abdominal wall hernia. Musculoskeletal: No significant bony findings. IMPRESSION: 1. Unremarkable MR appearance of the pancreaticbiliary tree for age. No findings for biliary obstruction, ampullary mass or pancreatic head mass. 2. No acute abdominal findings, mass lesions or adenopathy. Electronically Signed   By: Rudie MeyerP.  Gallerani M.D.   On: 06/05/2020 14:50   EEG adult  Result Date: 06/05/2020 Charlsie QuestYadav, Priyanka O, MD     06/05/2020 11:56 AM Patient Name: Charles LodgeLeroy Morgan MRN: [id number] Epilepsy Attending: Charlsie QuestPriyanka O Yadav Referring Physician/Provider: Dr. Onalee Huaavid Tat Date: 06/05/1020 Duration: 29.13 minutes Patient history: 86 year old male with  recurrent syncope.  EEG to evaluate for seizures. Level of alertness: Awake, asleep AEDs during EEG study: None Technical aspects: This EEG study was done with scalp electrodes positioned according to the 10-20 International system of electrode placement. Electrical activity was acquired at a sampling rate of 500Hz  and reviewed with a high frequency filter of 70Hz  and a low frequency filter of 1Hz . EEG data were recorded continuously and digitally stored. Description: The posterior dominant rhythm consists of 8.5 Hz activity of moderate voltage (25-35 uV) seen predominantly in posterior head regions, symmetric and reactive to eye opening and eye closing. Sleep was characterized by vertex waves, sleep spindles (12 to 14 Hz), maximal frontocentral region.  EEG also showed intermittent 2 to 3 Hz delta slowing in right temporal region.  Hyperventilation and photic stimulation were not performed.   ABNORMALITY -Intermittent slow, right temporal region IMPRESSION: This study is suggestive of nonspecific cortical dysfunction in the  right temporal region. No seizures or epileptiform discharges were seen throughout the recording.   MR ABDOMEN MRCP W WO CONTAST  Result Date: 06/05/2020 CLINICAL DATA:  Elevated liver function studies. EXAM: MRI ABDOMEN WITHOUT AND WITH CONTRAST (INCLUDING MRCP) TECHNIQUE: Multiplanar multisequence MR imaging of the abdomen was performed both before and after the administration of intravenous contrast. Heavily T2-weighted images of the biliary and pancreatic ducts were obtained, and three-dimensional MRCP images were rendered by post processing. CONTRAST:  3mL GADAVIST GADOBUTROL 1 MMOL/ML IV SOLN COMPARISON:  CT scan 05/19/2020 FINDINGS: Examination is somewhat limited by breathing motion artifact. Lower chest: The lungs are grossly clear. No worrisome pulmonary lesions. No pleural or pericardial effusion. Heart is within normal limits in size for the patient's age. Hepatobiliary: No hepatic lesions are identified. The gallbladder is unremarkable. No gallstones, wall thickening or pericholecystic fluid. No intrahepatic biliary dilatation. The common bile duct is within normal limits in caliber given the patient's age. It measures approximately 7.5 mm in the porta hepatis and 7.5 mm in the head of the pancreas. It tapers normally to the ampulla. No common bile duct stones are identified. Pancreas:  No mass, inflammation or ductal dilatation. Spleen:  Normal size.  No focal lesions. Adrenals/Urinary Tract: The adrenal glands and kidneys are unremarkable. No worrisome renal lesions or hydronephrosis. Stomach/Bowel: The stomach, duodenum, visualized small bowel and visualized colon are grossly normal. Vascular/Lymphatic: The aorta is normal in caliber. The branch vessels are patent. No mesenteric or retroperitoneal mass or adenopathy. Other:  No ascites or abdominal wall hernia. Musculoskeletal: No significant bony findings. IMPRESSION: 1. Unremarkable MR appearance of the pancreaticbiliary tree  for age. No findings for biliary obstruction, ampullary mass or pancreatic head mass. 2. No acute abdominal findings, mass lesions or adenopathy. Electronically Signed   By: 08/05/2020 M.D.   On: 06/05/2020 14:50   ECHOCARDIOGRAM COMPLETE  Result Date: 06/05/2020    ECHOCARDIOGRAM REPORT   Patient Name:   Charles Morgan Date of Exam: 06/05/2020 Medical Rec #:  08/05/2020    Height:       67.0 in Accession #:    [id number] Morgan   Weight:       150.6 lb Date of Birth:  27-Mar-1930   BSA:          1.792 m Patient Age:    86 years     BP:           132/76 mmHg Patient Gender: M            HR:  71 bpm. [facility] Procedure: 2D Echo, Cardiac Doppler and Color Doppler Indications:    Syncope 780.2 / R55  History:        Patient has no prior history of Echocardiogram examinations.                 Risk Factors:Dyslipidemia. CKD (chronic kidney disease) stage 3,                 Bradycardia, sinus (From Hx), CVA (cerebral vascular accident)                 (HCC) (From Hx), Vertebrobasilar artery insufficiency (From Hx).  Sonographer:    Celesta Gentile RCS Referring Phys: [phone] DAVID TAT IMPRESSIONS  1. Left ventricular ejection fraction, by estimation, is 50 to 55%. The left ventricle has low normal function. The left ventricle demonstrates regional wall motion abnormalities (see scoring diagram/findings for description). There is moderate left ventricular hypertrophy. Left ventricular diastolic parameters are consistent with Grade I diastolic dysfunction (impaired relaxation).  2. Right ventricular systolic function is normal. The right ventricular size is normal. There is mildly elevated pulmonary artery systolic pressure. The estimated right ventricular systolic pressure is 39.1 mmHg.  3. Left atrial size was severely dilated.  4. Right atrial size was upper normal.  5. The mitral valve is abnormal. Moderate mitral valve regurgitation.  6. The aortic valve is tricuspid. There is severe calcifcation of  the aortic valve with severely reduced cusp excursion. Aortic valve regurgitation is mild. Aortic valve mean gradient measures 20.5 mmHg. Aortic valve Vmax measures 2.96 m/s. Dimentionless index is 0.32. Severe aortic stenosis, although not all parameters are consistent.  7. The inferior vena cava is normal in size with <50% respiratory variability, suggesting right atrial pressure of 8 mmHg. FINDINGS  Left Ventricle: Left ventricular ejection fraction, by estimation, is 50 to 55%. The left ventricle has low normal function. The left ventricle demonstrates regional wall motion abnormalities. The left ventricular internal cavity size was normal in size. There is moderate left ventricular hypertrophy. Left ventricular diastolic parameters are consistent with Grade I diastolic dysfunction (impaired relaxation).  LV Wall Scoring: The basal inferolateral segment, basal anterolateral segment, and basal inferior segment are hypokinetic. The entire anterior wall, mid and distal lateral wall, entire septum, entire apex, mid and distal inferior wall, and mid anterolateral segment are normal. Right Ventricle: The right ventricular size is normal. No increase in right ventricular wall thickness. Right ventricular systolic function is normal. There is mildly elevated pulmonary artery systolic pressure. The tricuspid regurgitant velocity is 2.79  m/s, and with an assumed right atrial pressure of 8 mmHg, the estimated right ventricular systolic pressure is 39.1 mmHg. Left Atrium: Left atrial size was severely dilated. Right Atrium: Right atrial size was upper normal. Pericardium: There is no evidence of pericardial effusion. Mitral Valve: The mitral valve is abnormal. There is mild calcification of the mitral valve leaflet(s). Moderate mitral valve regurgitation. Tricuspid Valve: The tricuspid valve is grossly normal. Tricuspid valve regurgitation is trivial. Aortic Valve: The aortic valve is tricuspid. There is severe  calcifcation of the aortic valve. Aortic valve regurgitation is mild. Aortic regurgitation PHT measures 565 msec. Aortic valve mean gradient measures 20.5 mmHg. Aortic valve peak gradient measures 34.9 mmHg. Aortic valve area, by VTI measures 0.72 cm. Pulmonic Valve: The pulmonic valve was grossly normal. Pulmonic valve regurgitation is trivial. Aorta: The aortic root is normal in size and structure. Venous: The inferior vena cava is normal in  size with less than 50% respiratory variability, suggesting right atrial pressure of 8 mmHg. IAS/Shunts: No atrial level shunt detected by color flow Doppler.  LEFT VENTRICLE PLAX 2D LVIDd:         5.12 cm  Diastology LVIDs:         3.27 cm  LV e' medial:    4.35 cm/s LV PW:         1.30 cm  LV E/e' medial:  20.0 LV IVS:        1.39 cm  LV e' lateral:   7.18 cm/s LVOT diam:     1.70 cm  LV E/e' lateral: 12.1 LV SV:         55 LV SV Index:   31 LVOT Area:     2.27 cm  RIGHT VENTRICLE RV S prime:     9.90 cm/s TAPSE (M-mode): 2.1 cm LEFT ATRIUM             Index       RIGHT ATRIUM           Index LA diam:        4.00 cm 2.23 cm/m  RA Area:     18.80 cm LA Vol (A2C):   81.1 ml 45.25 ml/m RA Volume:   58.60 ml  32.69 ml/m LA Vol (A4C):   95.7 ml 53.39 ml/m LA Biplane Vol: 89.8 ml 50.10 ml/m  AORTIC VALVE AV Area (Vmax):    0.71 cm AV Area (Vmean):   0.68 cm AV Area (VTI):     0.72 cm AV Vmax:           295.50 cm/s AV Vmean:          211.000 cm/s AV VTI:            0.766 m AV Peak Grad:      34.9 mmHg AV Mean Grad:      20.5 mmHg LVOT Vmax:         92.90 cm/s LVOT Vmean:        63.300 cm/s LVOT VTI:          0.242 m LVOT/AV VTI ratio: 0.32 AI PHT:            565 msec  AORTA Ao Root diam: 3.10 cm MITRAL VALVE                 TRICUSPID VALVE MV Area (PHT): 3.01 cm      TR Peak grad:   31.1 mmHg MV Decel Time: 252 msec      TR Vmax:        279.00 cm/s MR Peak grad:    107.7 mmHg MR Mean grad:    71.0 mmHg   SHUNTS MR Vmax:         519.00 cm/s Systemic VTI:  0.24 m MR  Vmean:        388.0 cm/s  Systemic Diam: 1.70 cm MR PISA:         1.01 cm MR PISA Eff ROA: 6 mm MR PISA Radius:  0.40 cm MV E velocity: 87.10 cm/s MV A velocity: 96.30 cm/s MV E/A ratio:  0.90 Nona Dell MD Electronically signed by Nona Dell MD Signature Date/Time: 06/05/2020/11:43:23 AM    Final      Labs:   Basic Metabolic Panel: Recent Labs  Lab 06/04/20 1248 06/04/20 1248 06/04/20 1257 06/04/20 1257 06/05/20 0425 06/06/20 0802  NA 140  --  138  --  138 137  K 5.0   < > 4.1   < > 4.1 3.6  CL 107  --  106  --  105 107  CO2  --   --  25  --  25 20*  GLUCOSE 131*  --  127*  --  88 88  BUN 19  --  16  --  18 13  CREATININE 1.50*  --  1.28*  --  1.09 0.93  CALCIUM  --   --  9.1  --  9.2 8.5*  MG  --   --  2.0  --   --   --    < > = values in this interval not displayed.   GFR Estimated Creatinine Clearance: 52.3 mL/min (by C-G formula based on SCr of 0.93 mg/dL). Liver Function Tests: Recent Labs  Lab 06/04/20 1257 06/05/20 0425 06/05/20 1647 06/06/20 0802  AST 269* 312* 280* 261*  ALT 308* 339* 310* 302*  ALKPHOS 479* 507* 447* 453*  BILITOT 0.8 1.1 0.8 1.1  PROT 7.1 7.4 6.6 6.4*  ALBUMIN 3.5 3.6 3.2* 3.2*   No results for input(s): LIPASE, AMYLASE in the last 168 hours. No results for input(s): AMMONIA in the last 168 hours. Coagulation profile Recent Labs  Lab 06/04/20 1257  INR 1.0    CBC: Recent Labs  Lab 06/04/20 1248 06/04/20 1257 06/06/20 0802  WBC  --  6.8 6.4  NEUTROABS  --  4.3  --   HGB 14.6 14.1 12.9*  HCT 43.0 43.9 40.6  MCV  --  93.0 90.8  PLT  --  126* 124*   Cardiac Enzymes: Recent Labs  Lab 06/04/20 1257  CKTOTAL 171   BNP: Invalid input(s): POCBNP CBG: Recent Labs  Lab 06/05/20 0444 06/06/20 0546 06/06/20 0757 06/06/20 1135  GLUCAP 93 89 83 103*   D-Dimer No results for input(s): DDIMER in the last 72 hours. Hgb A1c Recent Labs    06/04/20 1257  HGBA1C 6.2*   Lipid Profile No results for input(s):  CHOL, HDL, LDLCALC, TRIG, CHOLHDL, LDLDIRECT in the last 72 hours. Thyroid function studies Recent Labs    06/05/20 0951  TSH 1.349   Anemia work up Recent Labs    06/05/20 0951  VITAMINB12 609  FOLATE 17.1   Microbiology Recent Results (from the past 240 hour(s))  SARS Coronavirus 2 by RT PCR (hospital order, performed in [facility] hospital lab) Nasopharyngeal Nasopharyngeal Swab     Status: None   Collection Time: 06/04/20 12:19 PM   Specimen: Nasopharyngeal Swab  Result Value Ref Range Status   SARS Coronavirus 2 NEGATIVE NEGATIVE Final    Comment: (NOTE) SARS-CoV-2 target nucleic acids are NOT DETECTED.  The SARS-CoV-2 RNA is generally detectable in upper and lower respiratory specimens during the acute phase of infection. The lowest concentration of SARS-CoV-2 viral copies this assay can detect is 250 copies / mL. A negative result does not preclude SARS-CoV-2 infection and should not be used as the sole basis for treatment or other patient management decisions.  A negative result may occur with improper specimen collection / handling, submission of specimen other than nasopharyngeal swab, presence of viral mutation(s) within the areas targeted by this assay, and inadequate number of viral copies (<250 copies / mL). A negative result must be combined with clinical observations, patient history, and epidemiological information.  Fact Sheet for Patients:   BoilerBrush.com.cy  Fact Sheet for Healthcare Providers: https://pope.com/  This test is not yet approved or  cleared by the  [place] [employer] trader and has been authorized for detection and/or diagnosis of SARS-CoV-2 by FDA under an TEFL teacher (EUA).  This EUA will remain in effect (meaning this test can be used) for the duration of the COVID-19 declaration under Section 564(b)(1) of the Act, 21 U.S.C. section 360bbb-3(b)(1), unless the authorization is  terminated or revoked sooner.  Performed at [facility], [address] [place], [place] [address]      Signed: Lorin Glass  [facility] 06/06/2020, 12:20 PM

## 2020-06-06 NOTE — Plan of Care (Signed)

## 2020-06-06 NOTE — Progress Notes (Signed)
Discharge instructions gone over with patient and daughter Deborha. Instructions on medications and follow up appointments given. All belongings sent with patient. Answered all questions at this time.

## 2020-06-06 NOTE — TOC Transition Note (Signed)
Transition of Care [facility]) - CM/SW Discharge Note   Patient Details  Name: Liron Eissler MRN: [id number] Date of Birth: 02-28-1930  Transition of [facility] CM/SW Contact:  Barry Brunner, LCSW Phone Number: 06/06/2020, 1:53 PM   Clinical Narrative:    CSW received consult for PCP referral. CSW provided List of PCP providers to patient. TOC signing off.     Barriers to Discharge: Continued Medical Work up   Patient Goals and CMS Choice Patient states their goals for this hospitalization and ongoing recovery are:: return home      Discharge Placement                       Discharge Plan and Services In-house Referral: Clinical Social Work, Orthoptist                                   Social Determinants of Health (SDOH) Interventions     Readmission Risk Interventions No flowsheet data found.

## 2020-06-06 NOTE — Progress Notes (Signed)
No significant change in transaminases and alkaline phosphatase.  Bilirubin remains normal. He has both cholestatic and hepatocellular injury pattern. We will start looking for other etiologies. Will order sed rate ANA, SMA, antimitochondrial antibody. Agree with plans for discharge. Will arrange for office visit in 2 weeks.

## 2020-06-07 LAB — ANTI-SMOOTH MUSCLE ANTIBODY, IGG: F-Actin IgG: 12 Units (ref 0–19)

## 2020-06-07 LAB — MITOCHONDRIAL ANTIBODIES: Mitochondrial M2 Ab, IgG: <20 Units (ref 0.0–20.0)

## 2020-06-08 LAB — ANTINUCLEAR ANTIBODIES, IFA: ANA Ab, IFA: NEGATIVE

## 2020-06-09 ENCOUNTER — Other Ambulatory Visit (INDEPENDENT_AMBULATORY_CARE_PROVIDER_SITE_OTHER): Payer: Self-pay

## 2020-06-09 ENCOUNTER — Encounter (HOSPITAL_COMMUNITY): Payer: Self-pay | Admitting: Internal Medicine

## 2020-06-09 DIAGNOSIS — R7989 Other specified abnormal findings of blood chemistry: Secondary | ICD-10-CM

## 2020-06-20 ENCOUNTER — Encounter (HOSPITAL_COMMUNITY): Payer: Self-pay | Admitting: Emergency Medicine

## 2020-06-20 ENCOUNTER — Emergency Department (HOSPITAL_COMMUNITY): Payer: Medicare HMO

## 2020-06-20 ENCOUNTER — Other Ambulatory Visit: Payer: Self-pay

## 2020-06-20 ENCOUNTER — Emergency Department (HOSPITAL_COMMUNITY)
Admission: EM | Admit: 2020-06-20 | Discharge: 2020-06-21 | Disposition: A | Discharge status: Home / Self Care | Payer: Medicare HMO | Attending: Emergency Medicine | Admitting: Emergency Medicine

## 2020-06-20 DIAGNOSIS — R202 Paresthesia of skin: Secondary | ICD-10-CM | POA: Diagnosis not present

## 2020-06-20 DIAGNOSIS — N183 Chronic kidney disease, stage 3 unspecified: Secondary | ICD-10-CM | POA: Insufficient documentation

## 2020-06-20 DIAGNOSIS — M79642 Pain in left hand: Secondary | ICD-10-CM | POA: Diagnosis present

## 2020-06-20 DIAGNOSIS — Z7982 Long term (current) use of aspirin: Secondary | ICD-10-CM | POA: Diagnosis not present

## 2020-06-20 LAB — CBC WITH DIFFERENTIAL/PLATELET
Abs Immature Granulocytes: 0.01 10*3/uL (ref 0.00–0.07)
Basophils Absolute: 0.1 10*3/uL (ref 0.0–0.1)
Basophils Relative: 1 %
Eosinophils Absolute: 0.3 10*3/uL (ref 0.0–0.5)
Eosinophils Relative: 5 %
HCT: 44.3 % (ref 39.0–52.0)
Hemoglobin: 14.2 g/dL (ref 13.0–17.0)
Immature Granulocytes: 0 %
Lymphocytes Relative: 25 %
Lymphs Abs: 1.6 10*3/uL (ref 0.7–4.0)
MCH: 29.1 pg (ref 26.0–34.0)
MCHC: 32.1 g/dL (ref 30.0–36.0)
MCV: 90.8 fL (ref 80.0–100.0)
Monocytes Absolute: 0.7 10*3/uL (ref 0.1–1.0)
Monocytes Relative: 12 %
Neutro Abs: 3.7 10*3/uL (ref 1.7–7.7)
Neutrophils Relative %: 57 %
Platelets: 129 10*3/uL — ABNORMAL LOW (ref 150–400)
RBC: 4.88 MIL/uL (ref 4.22–5.81)
RDW: 15.4 % (ref 11.5–15.5)
WBC: 6.4 10*3/uL (ref 4.0–10.5)
nRBC: 0 % (ref 0.0–0.2)

## 2020-06-20 LAB — BASIC METABOLIC PANEL
Anion gap: 9 (ref 5–15)
BUN: 19 mg/dL (ref 8–23)
CO2: 24 mmol/L (ref 22–32)
Calcium: 9 mg/dL (ref 8.9–10.3)
Chloride: 105 mmol/L (ref 98–111)
Creatinine, Ser: 1 mg/dL (ref 0.61–1.24)
GFR calc Af Amer: >60 mL/min (ref >60)
GFR calc non Af Amer: >60 mL/min (ref >60)
Glucose, Bld: 132 mg/dL — ABNORMAL HIGH (ref 70–99)
Potassium: 3.6 mmol/L (ref 3.5–5.1)
Sodium: 138 mmol/L (ref 135–145)

## 2020-06-20 MED ORDER — LIDOCAINE HCL (PF) 2 % IJ SOLN
10.0000 mL | Freq: Once | INTRAMUSCULAR | Status: AC
  Administered 2020-06-20: 10 mL

## 2020-06-20 MED ORDER — OXYCODONE-ACETAMINOPHEN 5-325 MG PO TABS
1.0000 | ORAL_TABLET | Freq: Once | ORAL | Status: AC
  Administered 2020-06-20: 1 via ORAL
  Filled 2020-06-20: qty 1

## 2020-06-20 MED ORDER — LIDOCAINE HCL (PF) 2 % IJ SOLN
INTRAMUSCULAR | Status: AC
  Filled 2020-06-20: qty 10

## 2020-06-20 MED ORDER — ONDANSETRON 8 MG PO TBDP
8.0000 mg | ORAL_TABLET | Freq: Once | ORAL | Status: AC
  Administered 2020-06-20: 8 mg via ORAL
  Filled 2020-06-20: qty 1

## 2020-06-20 NOTE — ED Provider Notes (Signed)
[facility] EMERGENCY DEPARTMENT Provider Note   CSN: [id number] Arrival date & time: 06/20/20  1806     History Chief Complaint  Patient presents with  . hand swelling    Charles Morgan is a 86 y.o. male.  HPI Patient presents with pain in his left hand.  States woke up this morning with it.  [place] Spaniel it hurts.  States he normally has pain in the other hand but normally does not get swelling.  No injury that he knows of but states he has had falls but none in the last few days.  No fevers or chills.    Past Medical History:  Diagnosis Date  . Aortic stenosis   . Arthritis   . Bradycardia, sinus 2021   Nocturnal bradycardia, not clearly symptomatic or associated with syncope  . CVA (cerebral vascular accident) [facility])    Two events: 2013, 2019  . DVT (deep venous thrombosis) (HCC) Aug '20   right LE  . HLD (hyperlipidemia)   . NSVT (nonsustained ventricular tachycardia) (HCC)    LVEF normal, not associated with syncope  . Proctocolitis without complication Aug '21   CT diagnosis  . Syncope and collapse    Multiple recurrences - vertebrobasilar insufficiency, autonomic dysfunction, ILR placed by cardiologist in [place]  . 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 Neurology eval - no need for further w/u  . Vertebrobasilar artery insufficiency     Patient Active Problem List   Diagnosis Date Noted  . CKD (chronic kidney disease) stage 3, GFR 30-59 ml/min 06/05/2020  . Basilar artery stenosis 06/05/2020  . Transaminasemia 06/05/2020  . Thrombocytopenia (HCC) 06/05/2020  . Syncope 06/04/2020  . Dehydration 06/04/2020  . AKI (acute kidney injury) (HCC) 06/04/2020  . HLD (hyperlipidemia) 06/04/2020  . Calcification of aortic valve 06/04/2020  . Syncope and collapse 06/04/2020    Past Surgical History:  Procedure Laterality Date  . ABDOMINAL SURGERY    . FLEXIBLE SIGMOIDOSCOPY N/A 06/05/2020   Procedure: FLEXIBLE SIGMOIDOSCOPY;  Surgeon: Malissa Hippo, MD;  Location: [facility];  Service: Endoscopy;  Laterality: N/A;  . HEMORRHOIDECTOMY WITH HEMORRHOID BANDING    . LOOP RECORDER IMPLANT    . SHOULDER SURGERY    . Thumb surgery         Family History  Family history unknown: Yes    Social History   Tobacco Use  . Smoking status: Never Smoker  . Smokeless tobacco: Never Used  Substance Use Topics  . Alcohol use: Never  . Drug use: Never    Home Medications Prior to Admission medications   Medication Sig Start Date End Date Taking? Authorizing Provider  aspirin EC 81 MG tablet Take 81 mg by mouth daily. Swallow whole.   Yes [provider]  atorvastatin (LIPITOR) 80 MG tablet Take 1 tablet (80 mg total) by mouth daily. 06/06/20  Yes Dahal, Melina Schools, MD  docusate sodium (COLACE) 100 MG capsule Take 100 mg by mouth 2 (two) times daily.   Yes [provider]    Allergies    Tuberculin  Review of Systems   Review of Systems  Constitutional: Negative for appetite change.  HENT: Negative for congestion.   Respiratory: Negative for shortness of breath.   Gastrointestinal: Negative for abdominal pain.  Musculoskeletal:       Left hand pain and swelling.  Skin: Negative for wound.  Neurological: Negative for weakness and numbness.       Chronic numbness in  right hand.    Physical Exam Updated Vital Signs BP (!) 172/97   Pulse 62   Temp 98.9 F (37.2 C) (Oral)   Resp 16   Ht 5\' 7"  (1.702 m)   Wt 68 kg   SpO2 99%   BMI 23.49 kg/m   Physical Exam Vitals and nursing note reviewed.  HENT:     Head: Normocephalic.  Eyes:     General: No scleral icterus. Cardiovascular:     Rate and Rhythm: Regular rhythm.  Musculoskeletal:     Cervical back: Neck supple.     Comments: Tenderness and swelling to palm proximally on left hand.  Good movement in wrist.  Good movement of fingers.  No erythema.  No fluctuance.  Skin:    General: Skin is warm.     Capillary Refill: Capillary refill takes  less than 2 seconds.  Neurological:     Mental Status: He is alert and oriented to person, place, and time.     ED Results / Procedures / Treatments   Labs (all labs ordered are listed, but only abnormal results are displayed) Labs Reviewed  CBC WITH DIFFERENTIAL/PLATELET - Abnormal; Notable for the following components:      Result Value   Platelets 129 (*)    All other components within normal limits  BASIC METABOLIC PANEL - Abnormal; Notable for the following components:   Glucose, Bld 132 (*)    All other components within normal limits  BODY FLUID CULTURE  GRAM STAIN  BODY FLUID CELL COUNT WITH DIFFERENTIAL  SYNOVIAL FLUID, CRYSTAL    EKG None  Radiology DG Hand 2 View Left  Result Date: 06/20/2020 CLINICAL DATA:  Pain and swelling EXAM: LEFT HAND - 2 VIEW COMPARISON:  None. FINDINGS: Riding of the scapholunate space is noted consistent with ligamentous injury. 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 No acute fracture or dislocation is noted. No soft tissue abnormality is seen. IMPRESSION: Degenerative change without acute abnormality. Electronically Signed   By: 06/22/2020 M.D.   On: 06/20/2020 18:40    Procedures Procedures (including critical care time)  Medications Ordered in ED Medications  lidocaine HCl (PF) (XYLOCAINE) 2 % injection 10 mL (10 mLs Infiltration Given by Other 06/20/20 2311)  oxyCODONE-acetaminophen (PERCOCET/ROXICET) 5-325 MG per tablet 1 tablet (1 tablet Oral Given 06/20/20 2310)  ondansetron (ZOFRAN-ODT) disintegrating tablet 8 mg (8 mg Oral Given 06/20/20 2311)    ED Course  I have reviewed the triage vital signs and the nursing notes.  Pertinent labs & imaging results that were available during my care of the patient were reviewed by me and considered in my medical decision making (see chart for details).    MDM Rules/Calculators/A&P                          Patient with hand pain and swelling.  Lab work  reassuring.  However did have fullness on the palmar aspect with possible fluid collection.  Aspiration done with thick somewhat cloudy fluid.  Culture and cell count sent.  Reportedly has follow-up with hand surgery in 2 days.  Can either be followed with that surgeon or Dr. 2312. Final Clinical Impression(s) / ED Diagnoses Final diagnoses:  Hand pain, left    Rx / DC Orders ED Discharge Orders    None       Merlyn Lot, MD 06/20/20 2332

## 2020-06-20 NOTE — Discharge Instructions (Addendum)
You had a fluid collection in the hand.  Is likely either a ganglion cyst or fluid from your wrist.  Sampling of it has been sent and can be followed by the hand surgeon.  Follow either with Dr. Merlyn Lot or ideally the doctor you are scheduled to see in 2 days.

## 2020-06-20 NOTE — ED Triage Notes (Signed)
Pt presents to c/o left hand/wrist swelling. Pt states he work up and saw the swelling. Area is tender to touch. Cap refill to left hand is <3sec.

## 2020-06-21 LAB — BODY FLUID CELL COUNT WITH DIFFERENTIAL
Eos, Fluid: 0 %
Lymphs, Fluid: 3 %
Monocyte-Macrophage-Serous Fluid: 5 % — ABNORMAL LOW (ref 50–90)
Neutrophil Count, Fluid: 92 % — ABNORMAL HIGH (ref 0–25)
Other Cells, Fluid: 0 %
Total Nucleated Cell Count, Fluid: 5000 cu mm — ABNORMAL HIGH (ref 0–1000)

## 2020-06-21 LAB — SYNOVIAL FLUID, CRYSTAL: Crystals, Fluid: NONE SEEN

## 2020-06-24 ENCOUNTER — Ambulatory Visit (HOSPITAL_COMMUNITY): Payer: Medicare HMO | Admitting: Physical Therapy

## 2020-06-24 LAB — BODY FLUID CULTURE: Culture: NO GROWTH

## 2020-07-02 ENCOUNTER — Ambulatory Visit (HOSPITAL_COMMUNITY): Payer: Medicare HMO | Admitting: Physical Therapy

## 2020-07-09 DIAGNOSIS — M25539 Pain in unspecified wrist: Secondary | ICD-10-CM | POA: Diagnosis not present

## 2020-07-09 DIAGNOSIS — W19XXXA Unspecified fall, initial encounter: Secondary | ICD-10-CM | POA: Diagnosis not present

## 2020-07-21 ENCOUNTER — Other Ambulatory Visit: Payer: Self-pay

## 2020-07-21 ENCOUNTER — Encounter (HOSPITAL_COMMUNITY): Payer: Self-pay | Admitting: Physical Therapy

## 2020-07-21 ENCOUNTER — Ambulatory Visit (HOSPITAL_COMMUNITY): Payer: Medicare HMO | Attending: Internal Medicine | Admitting: Physical Therapy

## 2020-07-21 DIAGNOSIS — R29898 Other symptoms and signs involving the musculoskeletal system: Secondary | ICD-10-CM

## 2020-07-21 DIAGNOSIS — R2689 Other abnormalities of gait and mobility: Secondary | ICD-10-CM | POA: Diagnosis not present

## 2020-07-21 NOTE — Therapy (Signed)
[facility] [address] [place], [place], [address] Phone: [phone]   Fax:  [phone]  Physical Therapy Evaluation  Patient Details  Name: Charles Morgan MRN: [id number] Date of Birth: Mar 20, 1930 Referring Provider (PT): Catarina Hartshorn MD   Encounter Date: 07/21/2020   PT End of Session - 07/21/20 1621    Visit Number 1    Number of Visits 8    Date for PT Re-Evaluation 08/18/20    Authorization Type [employer]    Authorization Time Period requesting 8 visits 10/26- 11/23 - check auth    Authorization - Visit Number 1    Authorization - Number of Visits 8    PT Start Time 1501   arrives late   PT Stop Time 1528    PT Time Calculation (min) 27 min    Activity Tolerance Patient tolerated treatment well    Behavior During Therapy [facility] for tasks assessed/performed           Past Medical History:  Diagnosis Date   Aortic stenosis    Arthritis    Bradycardia, sinus 2021   Nocturnal bradycardia, not clearly symptomatic or associated with syncope   CVA (cerebral vascular accident) (HCC)    Two events: 2013, 2019   DVT (deep venous thrombosis) (HCC) Aug '20   right LE   HLD (hyperlipidemia)    NSVT (nonsustained ventricular tachycardia) (HCC)    LVEF normal, not associated with syncope   Proctocolitis without complication Aug '21   CT diagnosis   Syncope and collapse    Multiple recurrences - vertebrobasilar insufficiency, autonomic dysfunction, ILR placed by cardiologist in [place]   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 Neurology eval - no need for further w/u   Vertebrobasilar artery insufficiency     Past Surgical History:  Procedure Laterality Date   ABDOMINAL SURGERY     FLEXIBLE SIGMOIDOSCOPY N/A 06/05/2020   Procedure: FLEXIBLE SIGMOIDOSCOPY;  Surgeon: Malissa Hippo, MD;  Location: [facility];  Service: Endoscopy;  Laterality: N/A;   HEMORRHOIDECTOMY WITH HEMORRHOID  BANDING     LOOP RECORDER IMPLANT     SHOULDER SURGERY     Thumb surgery      There were no vitals filed for this visit.    Subjective Assessment - 07/21/20 1501    Subjective Patient is a 86 y.o. male who presents to physical therapy following syncope and collapse several months ago. Patient states his balance is not as good as it used to be. He fell in the tub twice in the last month. He has trouble with stairs and transfers. His main goal is to improve balance.    Limitations House hold activities;Walking;Standing    Patient Stated Goals to improve balance    Currently in Pain? Yes    Pain Score 7     Pain Location Hand    Pain Orientation Left              [facility] PT Assessment - 07/21/20 0001      Assessment   Medical Diagnosis Syncope and collapse, balance    Referring Provider (PT) Catarina Hartshorn MD    Onset Date/Surgical Date 06/04/20    Prior Therapy Inpatient rehab      Precautions   Precautions Fall      Restrictions   Weight Bearing Restrictions No      Balance Screen   Has the patient fallen in the past 6 months Yes  How many times? 6    Has the patient had a decrease in activity level because of a fear of falling?  No    Is the patient reluctant to leave their home because of a fear of falling?  No      Prior Function   Level of Independence Independent with basic ADLs    Vocation Retired      IT consultant   Overall Cognitive Status Within Functional Limits for tasks assessed      Observation/Other Assessments   Observations Ambulates without AD    Focus on Therapeutic Outcomes (FOTO)  not completed      ROM / Strength   AROM / PROM / Strength AROM;Strength      Strength   Strength Assessment Site Hip;Knee;Ankle    Right/Left Hip Right;Left    Right Hip Flexion 5/5    Left Hip Flexion 5/5    Right/Left Knee Right;Left    Right Knee Flexion 5/5    Right Knee Extension 5/5    Left Knee Flexion 5/5    Left Knee Extension 5/5    Right/Left Ankle  Right;Left    Right Ankle Dorsiflexion 5/5    Left Ankle Dorsiflexion 5/5      Transfers   Five time sit to stand comments  12.51 seconds, difficulty coming to full standing position      Ambulation/Gait   Ambulation/Gait Yes    Ambulation/Gait Assistance 4: Min guard    Ambulation Distance (Feet) 300 Feet    Assistive device None    Gait Pattern Poor foot clearance - right;Poor foot clearance - left;Trunk flexed;Right flexed knee in stance;Left flexed knee in stance    Ambulation Surface Level;Indoor    Gait velocity decreased    Stairs Yes    Stairs Assistance 4: Min guard    Stair Management Technique Alternating pattern;One rail Right;One rail Left;Two rails    Number of Stairs 4    Height of Stairs 7    Gait Comments 2 MWT, unsteady on stairs, impaired motor control and eccentric strength                      Objective measurements completed on examination: See above findings.               PT Education - 07/21/20 1501    Education Details Patient educated on exam findings, POC, scope of PT    Person(s) Educated Patient    Methods Explanation;Demonstration    Comprehension Verbalized understanding;Returned demonstration            PT Short Term Goals - 07/21/20 1627      PT SHORT TERM GOAL #1   Title Patient will be independent with HEP in order to improve functional outcomes.    Time 2    Period Weeks    Status New    Target Date 08/04/20      PT SHORT TERM GOAL #2   Title Patient will report at least 25% improvement in symptoms for improved quality of life.    Time 2    Period Weeks    Status New    Target Date 08/04/20             PT Long Term Goals - 07/21/20 1628      PT LONG TERM GOAL #1   Title Patient will be able to complete 5x STS in under 11.4 seconds in order to reduce the risk of falls.  Time 4    Period Weeks    Status New    Target Date 08/18/20      PT LONG TERM GOAL #2   Title Patient will be able to  navigate stairs with reciprocal pattern without compensation in order to demonstrate improved LE strength.    Time 4    Period Weeks    Status New    Target Date 08/18/20      PT LONG TERM GOAL #3   Title Patient will not report a fall for at least 2 weeks in order to indicate improving balance.    Time 4    Period Weeks    Status New    Target Date 08/18/20                  Plan - 07/21/20 1624    Clinical Impression Statement Patient is a 86 y.o. male who presents to physical therapy following syncope and collapse several months ago. He presents with deficits in LE strength, balance, endurance, postural impairments, functional mobility with ADL. He is having to modify and restrict ADL as indicated by subjective information and objective measures which is affecting overall participation. Patient has had several falls and remains a high risk for falling. Patient will benefit from skilled physical therapy in order to improve function and reduce impairment.    Personal Factors and Comorbidities Age;Fitness;Time since onset of injury/illness/exacerbation;Transportation    Examination-Activity Limitations Transfers;Stairs;Squat;Locomotion Level;Lift    Examination-Participation Restrictions Cleaning;Community Activity;Volunteer;Yard Work;Shop    Stability/Clinical Decision Making Stable/Uncomplicated    Clinical Decision Making Low    Rehab Potential Fair    PT Frequency 2x / week    PT Duration 4 weeks    PT Treatment/Interventions ADLs/Self Care Home Management;Aquatic Therapy;Cryotherapy;Electrical Stimulation;Iontophoresis 4mg /ml Dexamethasone;Moist Heat;Traction;DME Instruction;Gait training;Stair training;Functional mobility training;Therapeutic activities;Therapeutic exercise;Balance training;Neuromuscular re-education;Patient/family education;Energy conservation;Dry needling;Joint Manipulations;Vestibular;Splinting;Manual techniques    PT Next Visit Plan begin balance training,  possibly trial SPC for gait, begin some functional strengthening with stairs, squats    PT Home Exercise Plan initiate next session    Consulted and Agree with Plan of Care Patient           Patient will benefit from skilled therapeutic intervention in order to improve the following deficits and impairments:  Abnormal gait, Difficulty walking, Decreased endurance, Decreased activity tolerance, Decreased knowledge of use of DME, Decreased mobility, Decreased strength, Improper body mechanics  Visit Diagnosis: Other abnormalities of gait and mobility  Other symptoms and signs involving the musculoskeletal system     Problem List Patient Active Problem List   Diagnosis Date Noted   CKD (chronic kidney disease) stage 3, GFR 30-59 ml/min (HCC) 06/05/2020   Basilar artery stenosis 06/05/2020   Transaminasemia 06/05/2020   Thrombocytopenia (HCC) 06/05/2020   Syncope 06/04/2020   Dehydration 06/04/2020   AKI (acute kidney injury) (HCC) 06/04/2020   HLD (hyperlipidemia) 06/04/2020   Calcification of aortic valve 06/04/2020   Syncope and collapse 06/04/2020    4:31 PM, 07/21/20 07/23/20 PT, DPT Physical Therapist at [facility]   [place] [facility] [address] [place], [place], [place] Phone: [phone]   Fax:  [phone]  Name: Charles Morgan MRN: [name] Date of Birth: 07-10-1930

## 2020-07-24 ENCOUNTER — Other Ambulatory Visit: Payer: Self-pay

## 2020-07-24 DIAGNOSIS — M7989 Other specified soft tissue disorders: Secondary | ICD-10-CM

## 2020-07-27 ENCOUNTER — Encounter (HOSPITAL_COMMUNITY): Payer: Medicare HMO

## 2020-07-27 DIAGNOSIS — R55 Syncope and collapse: Secondary | ICD-10-CM | POA: Diagnosis not present

## 2020-07-30 ENCOUNTER — Ambulatory Visit (HOSPITAL_COMMUNITY): Payer: Medicare HMO | Attending: Internal Medicine | Admitting: Physical Therapy

## 2020-07-30 ENCOUNTER — Telehealth (HOSPITAL_COMMUNITY): Payer: Self-pay | Admitting: Physical Therapy

## 2020-07-30 NOTE — Telephone Encounter (Signed)
Pt did not show for appointment.  Called number given with answer but unable to hear anyone on the other end.   Lurena Nida, PTA/CLT [phone]

## 2020-07-31 ENCOUNTER — Ambulatory Visit (HOSPITAL_COMMUNITY): Payer: Medicare HMO

## 2020-07-31 ENCOUNTER — Encounter (HOSPITAL_COMMUNITY): Payer: Medicare HMO

## 2020-08-04 ENCOUNTER — Ambulatory Visit (HOSPITAL_COMMUNITY): Payer: Medicare HMO | Admitting: Physical Therapy

## 2020-08-04 ENCOUNTER — Telehealth (HOSPITAL_COMMUNITY): Payer: Self-pay | Admitting: Physical Therapy

## 2020-08-04 NOTE — Telephone Encounter (Signed)
Pt did not show for appt (NS#2)  Called both numbers given without answer or voicemail   Lurena Nida, PTA/CLT [phone]

## 2020-08-06 ENCOUNTER — Encounter (HOSPITAL_COMMUNITY): Payer: Self-pay | Admitting: Physical Therapy

## 2020-08-06 ENCOUNTER — Telehealth (HOSPITAL_COMMUNITY): Payer: Self-pay | Admitting: Physical Therapy

## 2020-08-06 ENCOUNTER — Ambulatory Visit (HOSPITAL_COMMUNITY): Payer: Medicare HMO | Admitting: Physical Therapy

## 2020-08-06 NOTE — Telephone Encounter (Signed)
Pt did not show for appt.(3rd NS)  Called and spoke to CG who states she would like to hold therapy for now.  Explained we would discharge him and he could return when ready with a new MD order.  CG verbalized understanding.   Lurena Nida, PTA/CLT [phone]

## 2020-08-06 NOTE — Therapy (Signed)
[place], [place], [address] Phone: [phone]   Fax:  [phone]  Patient Details  Name: Charles Morgan MRN: [id number] Date of Birth: 12-11-1929 Referring Provider:  No ref. provider found  Encounter Date: 08/06/2020   PHYSICAL THERAPY DISCHARGE SUMMARY  Visits from Start of Care: 1  Current functional level related to goals / functional outcomes: Patient has not met any goals due to only coming in for evaluation. Patient requesting to be discharged secondary to transportation issues.    Remaining deficits: Patient has not returned.   Education / Equipment: Exam findings  Plan: Patient agrees to discharge.  Patient goals were not met. Patient is being discharged due to the patient's request.  ?????        3:23 PM, 08/06/20 Mearl Latin PT, DPT Physical Therapist at [place], [place], [address] Phone: [phone]   Fax:  [phone]

## 2020-08-10 ENCOUNTER — Encounter (HOSPITAL_COMMUNITY): Payer: Medicare HMO | Admitting: Physical Therapy

## 2020-08-12 ENCOUNTER — Ambulatory Visit (HOSPITAL_COMMUNITY): Payer: Medicare HMO | Admitting: Physical Therapy

## 2020-08-17 ENCOUNTER — Encounter (HOSPITAL_COMMUNITY): Payer: Medicare HMO | Admitting: Physical Therapy

## 2020-08-19 ENCOUNTER — Encounter (HOSPITAL_COMMUNITY): Payer: Medicare HMO | Admitting: Physical Therapy

## 2020-08-25 ENCOUNTER — Other Ambulatory Visit: Payer: Self-pay

## 2020-08-25 DIAGNOSIS — M7989 Other specified soft tissue disorders: Secondary | ICD-10-CM

## 2020-08-27 DIAGNOSIS — R55 Syncope and collapse: Secondary | ICD-10-CM | POA: Diagnosis not present

## 2020-08-31 ENCOUNTER — Emergency Department (HOSPITAL_COMMUNITY): Payer: Medicare HMO

## 2020-08-31 ENCOUNTER — Encounter (HOSPITAL_COMMUNITY): Payer: Self-pay | Admitting: Emergency Medicine

## 2020-08-31 ENCOUNTER — Other Ambulatory Visit: Payer: Self-pay

## 2020-08-31 ENCOUNTER — Inpatient Hospital Stay (HOSPITAL_COMMUNITY)
Admission: EM | Admit: 2020-08-31 | Discharge: 2020-09-04 | DRG: 640 | Disposition: A | Discharge status: Skilled Nursing Facility | Payer: Medicare HMO | Attending: Internal Medicine | Admitting: Internal Medicine

## 2020-08-31 DIAGNOSIS — R1084 Generalized abdominal pain: Secondary | ICD-10-CM | POA: Diagnosis not present

## 2020-08-31 DIAGNOSIS — R001 Bradycardia, unspecified: Secondary | ICD-10-CM | POA: Diagnosis not present

## 2020-08-31 DIAGNOSIS — R4182 Altered mental status, unspecified: Secondary | ICD-10-CM | POA: Diagnosis present

## 2020-08-31 DIAGNOSIS — Z7982 Long term (current) use of aspirin: Secondary | ICD-10-CM

## 2020-08-31 DIAGNOSIS — R400 Somnolence: Secondary | ICD-10-CM | POA: Diagnosis not present

## 2020-08-31 DIAGNOSIS — E7220 Disorder of urea cycle metabolism, unspecified: Secondary | ICD-10-CM | POA: Diagnosis present

## 2020-08-31 DIAGNOSIS — K5900 Constipation, unspecified: Secondary | ICD-10-CM | POA: Diagnosis present

## 2020-08-31 DIAGNOSIS — R531 Weakness: Secondary | ICD-10-CM | POA: Diagnosis not present

## 2020-08-31 DIAGNOSIS — F0390 Unspecified dementia without behavioral disturbance: Secondary | ICD-10-CM | POA: Diagnosis present

## 2020-08-31 DIAGNOSIS — R41841 Cognitive communication deficit: Secondary | ICD-10-CM | POA: Diagnosis not present

## 2020-08-31 DIAGNOSIS — D6959 Other secondary thrombocytopenia: Secondary | ICD-10-CM | POA: Diagnosis present

## 2020-08-31 DIAGNOSIS — S4491XA Injury of unspecified nerve at shoulder and upper arm level, right arm, initial encounter: Secondary | ICD-10-CM | POA: Diagnosis present

## 2020-08-31 DIAGNOSIS — R55 Syncope and collapse: Secondary | ICD-10-CM | POA: Diagnosis not present

## 2020-08-31 DIAGNOSIS — E872 Acidosis, unspecified: Secondary | ICD-10-CM | POA: Diagnosis present

## 2020-08-31 DIAGNOSIS — G5601 Carpal tunnel syndrome, right upper limb: Secondary | ICD-10-CM | POA: Diagnosis not present

## 2020-08-31 DIAGNOSIS — E440 Moderate protein-calorie malnutrition: Secondary | ICD-10-CM | POA: Diagnosis not present

## 2020-08-31 DIAGNOSIS — R9431 Abnormal electrocardiogram [ECG] [EKG]: Secondary | ICD-10-CM | POA: Diagnosis not present

## 2020-08-31 DIAGNOSIS — Z8673 Personal history of transient ischemic attack (TIA), and cerebral infarction without residual deficits: Secondary | ICD-10-CM

## 2020-08-31 DIAGNOSIS — K838 Other specified diseases of biliary tract: Secondary | ICD-10-CM | POA: Diagnosis not present

## 2020-08-31 DIAGNOSIS — Z66 Do not resuscitate: Secondary | ICD-10-CM | POA: Diagnosis present

## 2020-08-31 DIAGNOSIS — R0689 Other abnormalities of breathing: Secondary | ICD-10-CM | POA: Diagnosis not present

## 2020-08-31 DIAGNOSIS — R7989 Other specified abnormal findings of blood chemistry: Secondary | ICD-10-CM | POA: Diagnosis present

## 2020-08-31 DIAGNOSIS — Z79899 Other long term (current) drug therapy: Secondary | ICD-10-CM

## 2020-08-31 DIAGNOSIS — G9341 Metabolic encephalopathy: Secondary | ICD-10-CM | POA: Diagnosis present

## 2020-08-31 DIAGNOSIS — R627 Adult failure to thrive: Secondary | ICD-10-CM | POA: Diagnosis present

## 2020-08-31 DIAGNOSIS — E782 Mixed hyperlipidemia: Secondary | ICD-10-CM

## 2020-08-31 DIAGNOSIS — G450 Vertebro-basilar artery syndrome: Secondary | ICD-10-CM | POA: Diagnosis present

## 2020-08-31 DIAGNOSIS — R410 Disorientation, unspecified: Secondary | ICD-10-CM

## 2020-08-31 DIAGNOSIS — R1110 Vomiting, unspecified: Secondary | ICD-10-CM | POA: Diagnosis not present

## 2020-08-31 DIAGNOSIS — M6281 Muscle weakness (generalized): Secondary | ICD-10-CM | POA: Diagnosis not present

## 2020-08-31 DIAGNOSIS — K760 Fatty (change of) liver, not elsewhere classified: Secondary | ICD-10-CM | POA: Diagnosis not present

## 2020-08-31 DIAGNOSIS — E785 Hyperlipidemia, unspecified: Secondary | ICD-10-CM | POA: Diagnosis present

## 2020-08-31 DIAGNOSIS — D696 Thrombocytopenia, unspecified: Secondary | ICD-10-CM | POA: Diagnosis present

## 2020-08-31 DIAGNOSIS — Z20822 Contact with and (suspected) exposure to covid-19: Secondary | ICD-10-CM | POA: Diagnosis not present

## 2020-08-31 DIAGNOSIS — Z86718 Personal history of other venous thrombosis and embolism: Secondary | ICD-10-CM

## 2020-08-31 DIAGNOSIS — K5730 Diverticulosis of large intestine without perforation or abscess without bleeding: Secondary | ICD-10-CM | POA: Diagnosis not present

## 2020-08-31 DIAGNOSIS — G909 Disorder of the autonomic nervous system, unspecified: Secondary | ICD-10-CM | POA: Diagnosis not present

## 2020-08-31 DIAGNOSIS — K449 Diaphragmatic hernia without obstruction or gangrene: Secondary | ICD-10-CM | POA: Diagnosis present

## 2020-08-31 DIAGNOSIS — R1310 Dysphagia, unspecified: Secondary | ICD-10-CM | POA: Diagnosis not present

## 2020-08-31 DIAGNOSIS — G629 Polyneuropathy, unspecified: Secondary | ICD-10-CM | POA: Diagnosis present

## 2020-08-31 DIAGNOSIS — I350 Nonrheumatic aortic (valve) stenosis: Secondary | ICD-10-CM | POA: Diagnosis present

## 2020-08-31 DIAGNOSIS — W19XXXA Unspecified fall, initial encounter: Secondary | ICD-10-CM | POA: Diagnosis not present

## 2020-08-31 DIAGNOSIS — Z888 Allergy status to other drugs, medicaments and biological substances status: Secondary | ICD-10-CM | POA: Diagnosis not present

## 2020-08-31 DIAGNOSIS — R748 Abnormal levels of other serum enzymes: Secondary | ICD-10-CM

## 2020-08-31 DIAGNOSIS — R2681 Unsteadiness on feet: Secondary | ICD-10-CM | POA: Diagnosis not present

## 2020-08-31 DIAGNOSIS — R7401 Elevation of levels of liver transaminase levels: Secondary | ICD-10-CM | POA: Diagnosis not present

## 2020-08-31 DIAGNOSIS — E8809 Other disorders of plasma-protein metabolism, not elsewhere classified: Secondary | ICD-10-CM | POA: Diagnosis present

## 2020-08-31 DIAGNOSIS — E860 Dehydration: Principal | ICD-10-CM | POA: Diagnosis present

## 2020-08-31 DIAGNOSIS — M792 Neuralgia and neuritis, unspecified: Secondary | ICD-10-CM | POA: Diagnosis not present

## 2020-08-31 DIAGNOSIS — M6389 Disorders of muscle in diseases classified elsewhere, multiple sites: Secondary | ICD-10-CM | POA: Diagnosis not present

## 2020-08-31 DIAGNOSIS — I639 Cerebral infarction, unspecified: Secondary | ICD-10-CM | POA: Diagnosis present

## 2020-08-31 DIAGNOSIS — R4020 Unspecified coma: Secondary | ICD-10-CM | POA: Diagnosis not present

## 2020-08-31 DIAGNOSIS — R29818 Other symptoms and signs involving the nervous system: Secondary | ICD-10-CM | POA: Diagnosis not present

## 2020-08-31 DIAGNOSIS — R52 Pain, unspecified: Secondary | ICD-10-CM | POA: Diagnosis not present

## 2020-08-31 DIAGNOSIS — R404 Transient alteration of awareness: Secondary | ICD-10-CM | POA: Diagnosis not present

## 2020-08-31 LAB — CBC WITH DIFFERENTIAL/PLATELET
Abs Immature Granulocytes: 0.04 10*3/uL (ref 0.00–0.07)
Basophils Absolute: 0 10*3/uL (ref 0.0–0.1)
Basophils Relative: 0 %
Eosinophils Absolute: 0 10*3/uL (ref 0.0–0.5)
Eosinophils Relative: 0 %
HCT: 39.2 % (ref 39.0–52.0)
Hemoglobin: 12.8 g/dL — ABNORMAL LOW (ref 13.0–17.0)
Immature Granulocytes: 0 %
Lymphocytes Relative: 6 %
Lymphs Abs: 0.6 10*3/uL — ABNORMAL LOW (ref 0.7–4.0)
MCH: 29.4 pg (ref 26.0–34.0)
MCHC: 32.7 g/dL (ref 30.0–36.0)
MCV: 89.9 fL (ref 80.0–100.0)
Monocytes Absolute: 0.4 10*3/uL (ref 0.1–1.0)
Monocytes Relative: 4 %
Neutro Abs: 9.2 10*3/uL — ABNORMAL HIGH (ref 1.7–7.7)
Neutrophils Relative %: 90 %
Platelets: 121 10*3/uL — ABNORMAL LOW (ref 150–400)
RBC: 4.36 MIL/uL (ref 4.22–5.81)
RDW: 18 % — ABNORMAL HIGH (ref 11.5–15.5)
WBC: 10.3 10*3/uL (ref 4.0–10.5)
nRBC: 0 % (ref 0.0–0.2)

## 2020-08-31 LAB — URINALYSIS, ROUTINE W REFLEX MICROSCOPIC
Bilirubin Urine: NEGATIVE
Glucose, UA: NEGATIVE mg/dL
Ketones, ur: NEGATIVE mg/dL
Leukocytes,Ua: NEGATIVE
Nitrite: NEGATIVE
Protein, ur: 100 mg/dL — AB
Specific Gravity, Urine: 1.018 (ref 1.005–1.030)
pH: 5 (ref 5.0–8.0)

## 2020-08-31 LAB — COMPREHENSIVE METABOLIC PANEL
ALT: 143 U/L — ABNORMAL HIGH (ref 0–44)
AST: 130 U/L — ABNORMAL HIGH (ref 15–41)
Albumin: 3.3 g/dL — ABNORMAL LOW (ref 3.5–5.0)
Alkaline Phosphatase: 869 U/L — ABNORMAL HIGH (ref 38–126)
Anion gap: 9 (ref 5–15)
BUN: 25 mg/dL — ABNORMAL HIGH (ref 8–23)
CO2: 24 mmol/L (ref 22–32)
Calcium: 9.1 mg/dL (ref 8.9–10.3)
Chloride: 107 mmol/L (ref 98–111)
Creatinine, Ser: 1.22 mg/dL (ref 0.61–1.24)
GFR, Estimated: 57 mL/min — ABNORMAL LOW (ref >60)
Glucose, Bld: 128 mg/dL — ABNORMAL HIGH (ref 70–99)
Potassium: 4 mmol/L (ref 3.5–5.1)
Sodium: 140 mmol/L (ref 135–145)
Total Bilirubin: 1.1 mg/dL (ref 0.3–1.2)
Total Protein: 7.5 g/dL (ref 6.5–8.1)

## 2020-08-31 LAB — LACTIC ACID, PLASMA
Lactic Acid, Venous: 2.5 mmol/L (ref 0.5–1.9)
Lactic Acid, Venous: 2.3 mmol/L (ref 0.5–1.9)
Lactic Acid, Venous: 2.3 mmol/L (ref 0.5–1.9)

## 2020-08-31 LAB — PROTIME-INR
INR: 1 (ref 0.8–1.2)
Prothrombin Time: 12.6 seconds (ref 11.4–15.2)

## 2020-08-31 LAB — AMMONIA: Ammonia: 44 umol/L — ABNORMAL HIGH (ref 9–35)

## 2020-08-31 LAB — RESP PANEL BY RT-PCR (FLU A&B, COVID) ARPGX2
Influenza A by PCR: NEGATIVE
Influenza B by PCR: NEGATIVE
SARS Coronavirus 2 by RT PCR: NEGATIVE

## 2020-08-31 LAB — ETHANOL: Alcohol, Ethyl (B): <10 mg/dL (ref <10)

## 2020-08-31 MED ORDER — DEXAMETHASONE SODIUM PHOSPHATE 4 MG/ML IJ SOLN: 4.0000 mg | Freq: Once | INTRAMUSCULAR | Status: DC

## 2020-08-31 MED ORDER — SODIUM CHLORIDE 0.9 % IV SOLN: INTRAVENOUS | Status: DC

## 2020-08-31 MED ORDER — ENOXAPARIN SODIUM 40 MG/0.4ML ~~LOC~~ SOLN
40.0000 mg | SUBCUTANEOUS | Status: DC
  Administered 2020-09-01 – 2020-09-04 (×3): 40 mg via SUBCUTANEOUS
  Filled 2020-08-31 (×4): qty 0.4

## 2020-08-31 MED ORDER — HYDROCORTISONE NA SUCCINATE PF 100 MG IJ SOLR
50.0000 mg | Freq: Once | INTRAMUSCULAR | Status: AC
  Administered 2020-09-01: 50 mg via INTRAVENOUS
  Filled 2020-08-31: qty 2

## 2020-08-31 MED ORDER — SODIUM CHLORIDE 0.9 % IV BOLUS
1000.0000 mL | Freq: Once | INTRAVENOUS | Status: AC
  Administered 2020-08-31: 1000 mL via INTRAVENOUS

## 2020-08-31 MED ORDER — IOHEXOL 300 MG/ML  SOLN
100.0000 mL | Freq: Once | INTRAMUSCULAR | Status: AC | PRN
  Administered 2020-08-31: 100 mL via INTRAVENOUS

## 2020-08-31 NOTE — ED Notes (Signed)
Labs drawn via Femoral vein by Dr Jeraldine Loots. Blood work had been unable to be obtained from arms.

## 2020-08-31 NOTE — ED Triage Notes (Signed)
Patient arrives via EMS from home. Daughter states she found him at home slumped in the floor with noted vomit around patient. Patient c/o abdominal pain. Patient arouses to voice, able to state name, birthdate, month and year.

## 2020-08-31 NOTE — ED Provider Notes (Signed)
[facility] EMERGENCY DEPARTMENT Provider Note   CSN: [id number] Arrival date & time: 08/31/20  1622     History Chief Complaint  Patient presents with  . Altered Mental Status    Charles Morgan is a 86 y.o. male.  HPI Patient presents from home via EMS.  Patient is withdrawn, slow speech, but does answer some questions himself.  Seemingly the patient was found at home on the floor by his daughter.  EMS reports the patient was slumped over, with vomit around him.  The patient self complains of abdominal pain, cannot specify where, or how long it has been there.  He denies chest pain.  EMS does not provide details from transport. Level 5 caveat secondary to change in mentation.    Past Medical History:  Diagnosis Date  . Aortic stenosis   . Arthritis   . Bradycardia, sinus 2021   Nocturnal bradycardia, not clearly symptomatic or associated with syncope  . CVA (cerebral vascular accident) [facility])    Two events: 2013, 2019  . DVT (deep venous thrombosis) (HCC) Aug '20   right LE  . HLD (hyperlipidemia)   . NSVT (nonsustained ventricular tachycardia) (HCC)    LVEF normal, not associated with syncope  . Proctocolitis without complication Aug '21   CT diagnosis  . Syncope and collapse    Multiple recurrences - vertebrobasilar insufficiency, autonomic dysfunction, ILR placed by cardiologist in [place]  . 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 Neurology eval - no need for further w/u  . Vertebrobasilar artery insufficiency     Patient Active Problem List   Diagnosis Date Noted  . CKD (chronic kidney disease) stage 3, GFR 30-59 ml/min (HCC) 06/05/2020  . Basilar artery stenosis 06/05/2020  . Transaminasemia 06/05/2020  . Thrombocytopenia (HCC) 06/05/2020  . Syncope 06/04/2020  . Dehydration 06/04/2020  . AKI (acute kidney injury) (HCC) 06/04/2020  . HLD (hyperlipidemia) 06/04/2020  . Calcification of aortic valve 06/04/2020  . Syncope and collapse  06/04/2020  . Ventricular tachycardia (HCC) 02/18/2020  . Adrenal insufficiency (Addison's disease) (HCC) 01/26/2020  . Ambulatory dysfunction 01/26/2020  . Severe muscle deconditioning 01/12/2020  . 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 Elevated AST (SGOT) 01/11/2020  . History of stroke 01/10/2020  . Epigastric pain 12/06/2018  . Stroke (HCC) 08/31/2017  . Near syncope 11/04/2015    Past Surgical History:  Procedure Laterality Date  . ABDOMINAL SURGERY    . FLEXIBLE SIGMOIDOSCOPY N/A 06/05/2020   Procedure: FLEXIBLE SIGMOIDOSCOPY;  Surgeon: Malissa Hippo, MD;  Location: [facility] ENDO SUITE;  Service: Endoscopy;  Laterality: N/A;  . HEMORRHOIDECTOMY WITH HEMORRHOID BANDING    . LOOP RECORDER IMPLANT    . SHOULDER SURGERY    . Thumb surgery         Family History  Family history unknown: Yes    Social History   Tobacco Use  . Smoking status: Never Smoker  . Smokeless tobacco: Never Used  Substance Use Topics  . Alcohol use: Never  . Drug use: Never    Home Medications Prior to Admission medications   Medication Sig Start Date End Date Taking? Authorizing Provider  acetaminophen (TYLENOL) 325 MG tablet Take 325 mg by mouth every 6 (six) hours as needed.  10/03/19  Yes [provider]  ammonium lactate (LAC-HYDRIN) 12 % lotion Apply 1 application topically as needed for dry skin.  03/10/20  Yes [provider]  aspirin EC 81 MG tablet Take 81 mg by mouth  daily. Swallow whole.   Yes [provider]  atorvastatin (LIPITOR) 80 MG tablet Take 1 tablet (80 mg total) by mouth daily. 06/06/20  Yes Dahal, Melina Schools, MD  Brinzolamide-Brimonidine 1-0.2 % SUSP Place 1 drop into both eyes in the morning and at bedtime.  12/12/19  Yes [provider]  docusate sodium (COLACE) 100 MG capsule Take 100 mg by mouth 2 (two) times daily.   Yes [provider]  methylPREDNISolone (MEDROL DOSEPAK) 4 MG TBPK tablet See admin instructions. Patient not  taking: Reported on 08/31/2020 06/24/20   [provider]    Allergies    Tuberculin  Review of Systems   Review of Systems  Unable to perform ROS: Mental status change    Physical Exam Updated Vital Signs BP (!) 141/69   Pulse (!) 58   Temp 98.1 F (36.7 C) (Rectal)   Resp 18   Ht 5\' 6"  (1.676 m)   Wt 63.5 kg   SpO2 100%   BMI 22.60 kg/m   Physical Exam Vitals and nursing note reviewed.  Constitutional:      Appearance: He is well-developed.     Comments: Resting, but arousable frail-appearing elderly male minimally interactive and verbal.  HENT:     Head: Normocephalic and atraumatic.  Eyes:     Conjunctiva/sclera: Conjunctivae normal.  Cardiovascular:     Rate and Rhythm: Normal rate and regular rhythm.  Pulmonary:     Effort: Pulmonary effort is normal. No respiratory distress.     Breath sounds: No stridor.  Abdominal:     General: There is no distension.     Tenderness: There is abdominal tenderness. There is guarding.  Skin:    General: Skin is warm and dry.     Comments: Skin cold to touch  Neurological:     Motor: Atrophy present.     Comments: Patient has diffuse atrophy, but does move all extremities spontaneously, minimally. Face is symmetric, speech, when utilized is brief, clear.  Psychiatric:        Cognition and Memory: Cognition is impaired. Memory is impaired.     ED Results / Procedures / Treatments   Labs (all labs ordered are listed, but only abnormal results are displayed) Labs Reviewed  COMPREHENSIVE METABOLIC PANEL - Abnormal; Notable for the following components:      Result Value   Glucose, Bld 128 (*)    BUN 25 (*)    Albumin 3.3 (*)    AST 130 (*)    ALT 143 (*)    Alkaline Phosphatase 869 (*)    GFR, Estimated 57 (*)    All other components within normal limits  LACTIC ACID, PLASMA - Abnormal; Notable for the following components:   Lactic Acid, Venous 2.5 (*)    All other components within normal limits   URINALYSIS, ROUTINE W REFLEX MICROSCOPIC - Abnormal; Notable for the following components:   Color, Urine AMBER (*)    APPearance HAZY (*)    Hgb urine dipstick MODERATE (*)    Protein, ur 100 (*)    Bacteria, UA RARE (*)    All other components within normal limits  CBC WITH DIFFERENTIAL/PLATELET - Abnormal; Notable for the following components:   Hemoglobin 12.8 (*)    RDW 18.0 (*)    Platelets 121 (*)    Neutro Abs 9.2 (*)    Lymphs Abs 0.6 (*)    All other components within normal limits  CULTURE, BLOOD (ROUTINE X 2)  RESP PANEL  BY RT-PCR (FLU A&B, COVID) [id number]  CULTURE, BLOOD (ROUTINE X 2)  PROTIME-INR  ETHANOL  CBC WITH DIFFERENTIAL/PLATELET  LACTIC ACID, PLASMA  LACTIC ACID, PLASMA    EKG EKG Interpretation  Date/Time:  Monday August 31 2020 17:09:40 EST Ventricular Rate:  60 PR Interval:    QRS Duration: 117 QT Interval:  447 QTC Calculation: 447 R Axis:   35 Text Interpretation: Sinus rhythm Probable left ventricular hypertrophy T wave abnormality No significant change since last tracing Abnormal ECG Confirmed by Gerhard MunchLockwood, Devontay Celaya [phone]([id number]) on 08/31/2020 5:19:02 PM   Radiology CT HEAD WO CONTRAST  Result Date: 08/31/2020 CLINICAL DATA:  Altered mental status. EXAM: CT HEAD WITHOUT CONTRAST TECHNIQUE: Contiguous axial images were obtained from the base of the skull through the vertex without intravenous contrast. COMPARISON:  June 04, 2020 FINDINGS: Brain: There is mild to moderate severity cerebral atrophy with widening of the extra-axial spaces and ventricular dilatation. There are areas of decreased attenuation within the white matter tracts of the supratentorial brain, consistent with microvascular disease changes. A stable area of cortical encephalomalacia, with adjacent chronic white matter low attenuation, are seen within the bilateral cerebellum. Vascular: No hyperdense vessel or unexpected calcification. Skull: Normal. Negative for fracture or focal  lesion. Sinuses/Orbits: No acute finding. Other: None. IMPRESSION: 1. Generalized cerebral atrophy with chronic bilateral cerebellar infarcts. 2. No acute intracranial abnormality. Electronically Signed   By: Aram Candelahaddeus  Houston M.D.   On: 08/31/2020 20:15   CT ABDOMEN PELVIS W CONTRAST  Result Date: 08/31/2020 CLINICAL DATA:  Abdominal pain and altered mental status. EXAM: CT ABDOMEN AND PELVIS WITH CONTRAST TECHNIQUE: Multidetector CT imaging of the abdomen and pelvis was performed using the standard protocol following bolus administration of intravenous contrast. CONTRAST:  100mL OMNIPAQUE IOHEXOL 300 MG/ML  SOLN COMPARISON:  May 19, 2020 FINDINGS: Lower chest: There is mild to moderate severity cardiomegaly. Mild atelectasis is seen within the bilateral lung bases. Hepatobiliary: There is diffuse fatty infiltration of the liver parenchyma. No focal liver abnormality is seen. No gallstones or gallbladder wall thickening. Stable dilatation of the common bile duct is noted. Pancreas: Unremarkable. No pancreatic ductal dilatation or surrounding inflammatory changes. Spleen: Normal in size without focal abnormality. Adrenals/Urinary Tract: Adrenal glands are unremarkable. Kidneys are normal in size, without renal calculi or hydronephrosis. Multiple subcentimeter cysts are seen within the right kidney. Bladder is unremarkable. Stomach/Bowel: There is a small hiatal hernia. The appendix is not clearly identified. No evidence of bowel wall dilatation. Noninflamed diverticula are seen throughout the large bowel. Vascular/Lymphatic: Aortic atherosclerosis. No enlarged abdominal or pelvic lymph nodes. Reproductive: The prostate gland is moderately enlarged. Other: No abdominal wall hernia or abnormality. No abdominopelvic ascites. Musculoskeletal: Multilevel degenerative changes seen throughout the lumbar spine. IMPRESSION: 1. Mild to moderate severity cardiomegaly. 2. Fatty liver. 3. Small hiatal hernia. 4. Colonic  diverticulosis. 5. Enlarged prostate gland. 6. Aortic atherosclerosis. Aortic Atherosclerosis (ICD10-I70.0). Electronically Signed   By: Aram Candelahaddeus  Houston M.D.   On: 08/31/2020 20:20   DG Chest Port 1 View  Result Date: 08/31/2020 CLINICAL DATA:  86 year old male found down, head vomited. EXAM: PORTABLE CHEST 1 VIEW COMPARISON:  Chest radiographs 06/04/2020. FINDINGS: Portable AP upright view at 1654 hours. Stable implanted right chest wall cardiac device. Stable mild cardiomegaly. Other mediastinal contours are within normal limits. Visualized tracheal air column is within normal limits. Allowing for portable technique the lungs are clear. No pneumothorax or pleural effusion is evident. Increased bowel gas in the visible upper abdomen although no dilated  loops are evident. No acute osseous abnormality identified. IMPRESSION: 1. No acute cardiopulmonary abnormality. 2. Increased gas in nondilated bowel in the visible upper abdomen. Electronically Signed   By: Odessa Fleming M.D.   On: 08/31/2020 17:18    Procedures Procedures (including critical care time)  Medications Ordered in ED Medications  sodium chloride 0.9 % bolus 1,000 mL (0 mLs Intravenous Stopped 08/31/20 1932)    And  0.9 %  sodium chloride infusion ( Intravenous New Bag/Given 08/31/20 1932)  iohexol (OMNIPAQUE) 300 MG/ML solution 100 mL (100 mLs Intravenous Contrast Given 08/31/20 2008)    ED Course  I have reviewed the triage vital signs and the nursing notes.  Pertinent labs & imaging results that were available during my care of the patient were reviewed by me and considered in my medical decision making (see chart for details).  Update:, Initial labs notable for lactic acidosis, 2.5.  On repeat exam this point, the patient remains soft-spoken, but awake, answering questions briefly.  Vital signs unremarkable.  9:06 PM Patient awakens easily, but is otherwise sleeping.  He remains disoriented.  Vital signs unremarkable.  Covid  negative.  11:03 PM Patient now awake, he remains disoriented.  Reviewed labs, CT, x-ray again, alcohol negative, Covid negative, CT abdomen pelvis generally reassuring aside from fatty liver disease, reflected in the patient's dysfunction on labs per In addition, patient is found to have persistent lactic acidosis in spite of initial fluid resuscitation. With consideration of this contributing to delirium, as well as Addison's disease, the patient will continue to receive fluid resuscitation, has received Decadron, will require admission for further monitoring, management. Final Clinical Impression(s) / ED Diagnoses Final diagnoses:  Delirium     Gerhard Munch, MD 08/31/20 2305

## 2020-08-31 NOTE — ED Notes (Addendum)
Date and time results received: 08/31/20 1940  Test: lactic acid Critical Value: 2.5  Name of Provider Notified: Jeraldine Loots, MD  Orders Received? Or Actions Taken?: acknowledged

## 2020-08-31 NOTE — H&P (Incomplete)
History and Physical  Charles Morgan TDS:[id number] DOB: 10-Mar-1930 DOA: 08/31/2020  Referring physician: ***  PCP: Patient, No Pcp Per  Outpatient Specialists: *** Patient coming from: *** & is able to ambulate ***  Chief Complaint: ***   HPI: Charles Morgan is a 86 y.o. male with medical history significant for *** (For level 3, the HPI must include 4+ descriptors: Location, Quality, Severity, Duration, Timing, Context, modifying factors, associated signs/symptoms and/or status of 3+ chronic problems.)   ED Course: ***  Review of Systems: Review of systems as noted in the HPI. All other systems reviewed and are negative.   Past Medical History:  Diagnosis Date  . Aortic stenosis   . Arthritis   . Bradycardia, sinus 2021   Nocturnal bradycardia, not clearly symptomatic or associated with syncope  . CVA (cerebral vascular accident) [facility])    Two events: 2013, 2019  . DVT (deep venous thrombosis) (HCC) Aug '20   right LE  . HLD (hyperlipidemia)   . NSVT (nonsustained ventricular tachycardia) (HCC)    LVEF normal, not associated with syncope  . Proctocolitis without complication Aug '21   CT diagnosis  . Syncope and collapse    Multiple recurrences - vertebrobasilar insufficiency, autonomic dysfunction, ILR placed by cardiologist in [place]  . 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 Neurology eval - no need for further w/u  . Vertebrobasilar artery insufficiency    Past Surgical History:  Procedure Laterality Date  . ABDOMINAL SURGERY    . FLEXIBLE SIGMOIDOSCOPY N/A 06/05/2020   Procedure: FLEXIBLE SIGMOIDOSCOPY;  Surgeon: Malissa Hippo, MD;  Location: [facility] ENDO SUITE;  Service: Endoscopy;  Laterality: N/A;  . HEMORRHOIDECTOMY WITH HEMORRHOID BANDING    . LOOP RECORDER IMPLANT    . SHOULDER SURGERY    . Thumb surgery      Social History:  reports that he has never smoked. He has never used smokeless tobacco. He reports that he does not drink  alcohol and does not use drugs.   Allergies  Allergen Reactions  . Tuberculin Rash    Family History  Family history unknown: Yes    ***  Prior to Admission medications   Medication Sig Start Date End Date Taking? Authorizing Provider  acetaminophen (TYLENOL) 325 MG tablet Take 325 mg by mouth every 6 (six) hours as needed.  10/03/19  Yes [provider]  ammonium lactate (LAC-HYDRIN) 12 % lotion Apply 1 application topically as needed for dry skin.  03/10/20  Yes [provider]  aspirin EC 81 MG tablet Take 81 mg by mouth daily. Swallow whole.   Yes [provider]  atorvastatin (LIPITOR) 80 MG tablet Take 1 tablet (80 mg total) by mouth daily. 06/06/20  Yes Dahal, Melina Schools, MD  Brinzolamide-Brimonidine 1-0.2 % SUSP Place 1 drop into both eyes in the morning and at bedtime.  12/12/19  Yes [provider]  docusate sodium (COLACE) 100 MG capsule Take 100 mg by mouth 2 (two) times daily.   Yes [provider]  methylPREDNISolone (MEDROL DOSEPAK) 4 MG TBPK tablet See admin instructions. Patient not taking: Reported on 08/31/2020 06/24/20   [provider]    Physical Exam: BP (!) 149/67   Pulse (!) 54   Temp 98.1 F (36.7 C) (Rectal)   Resp 17   Ht 5\' 6"  (1.676 m)   Wt 63.5 kg   SpO2 99%   BMI 22.60 kg/m   . General: 86 y.o. year-old male well developed  well nourished in no acute distress.  Alert and oriented x3. . Cardiovascular: Regular rate and rhythm with no rubs or gallops.  No thyromegaly or JVD noted.  No lower extremity edema. 2/4 pulses in all 4 extremities. Marland Kitchen Respiratory: Clear to auscultation with no wheezes or rales. Good inspiratory effort. . Abdomen: Soft nontender nondistended with normal bowel sounds x4 quadrants. . Muskuloskeletal: No cyanosis, clubbing or edema noted bilaterally . Neuro: CN II-XII intact, strength, sensation, reflexes . Skin: No ulcerative lesions noted or rashes . Psychiatry: Judgement and  insight appear normal. Mood is appropriate for condition and setting          Labs on Admission:  Basic Metabolic Panel: Recent Labs  Lab 08/31/20 1644  NA 140  K 4.0  CL 107  CO2 24  GLUCOSE 128*  BUN 25*  CREATININE 1.22  CALCIUM 9.1   Liver Function Tests: Recent Labs  Lab 08/31/20 1644  AST 130*  ALT 143*  ALKPHOS 869*  BILITOT 1.1  PROT 7.5  ALBUMIN 3.3*   No results for input(s): LIPASE, AMYLASE in the last 168 hours. No results for input(s): AMMONIA in the last 168 hours. CBC: Recent Labs  Lab 08/31/20 1644  WBC 10.3  NEUTROABS 9.2*  HGB 12.8*  HCT 39.2  MCV 89.9  PLT 121*   Cardiac Enzymes: No results for input(s): CKTOTAL, CKMB, CKMBINDEX, TROPONINI in the last 168 hours.  BNP (last 3 results) No results for input(s): BNP in the last 8760 hours.  ProBNP (last 3 results) No results for input(s): PROBNP in the last 8760 hours.  CBG: No results for input(s): GLUCAP in the last 168 hours.  Radiological Exams on Admission: CT HEAD WO CONTRAST  Result Date: 08/31/2020 CLINICAL DATA:  Altered mental status. EXAM: CT HEAD WITHOUT CONTRAST TECHNIQUE: Contiguous axial images were obtained from the base of the skull through the vertex without intravenous contrast. COMPARISON:  June 04, 2020 FINDINGS: Brain: There is mild to moderate severity cerebral atrophy with widening of the extra-axial spaces and ventricular dilatation. There are areas of decreased attenuation within the white matter tracts of the supratentorial brain, consistent with microvascular disease changes. A stable area of cortical encephalomalacia, with adjacent chronic white matter low attenuation, are seen within the bilateral cerebellum. Vascular: No hyperdense vessel or unexpected calcification. Skull: Normal. Negative for fracture or focal lesion. Sinuses/Orbits: No acute finding. Other: None. IMPRESSION: 1. Generalized cerebral atrophy with chronic bilateral cerebellar infarcts. 2. No  acute intracranial abnormality. Electronically Signed   By: Aram Candela M.D.   On: 08/31/2020 20:15   CT ABDOMEN PELVIS W CONTRAST  Result Date: 08/31/2020 CLINICAL DATA:  Abdominal pain and altered mental status. EXAM: CT ABDOMEN AND PELVIS WITH CONTRAST TECHNIQUE: Multidetector CT imaging of the abdomen and pelvis was performed using the standard protocol following bolus administration of intravenous contrast. CONTRAST:  OMNIPAQUE IOHEXOL 300 MG/ML  SOLN COMPARISON:  May 19, 2020 FINDINGS: Lower chest: There is mild to moderate severity cardiomegaly. Mild atelectasis is seen within the bilateral lung bases. Hepatobiliary: There is diffuse fatty infiltration of the liver parenchyma. No focal liver abnormality is seen. No gallstones or gallbladder wall thickening. Stable dilatation of the common bile duct is noted. Pancreas: Unremarkable. No pancreatic ductal dilatation or surrounding inflammatory changes. Spleen: Normal in size without focal abnormality. Adrenals/Urinary Tract: Adrenal glands are unremarkable. Kidneys are normal in size, without renal calculi or hydronephrosis. Multiple subcentimeter cysts are seen within the right kidney. Bladder is unremarkable. Stomach/Bowel: There  is a small hiatal hernia. The appendix is not clearly identified. No evidence of bowel wall dilatation. Noninflamed diverticula are seen throughout the large bowel. Vascular/Lymphatic: Aortic atherosclerosis. No enlarged abdominal or pelvic lymph nodes. Reproductive: The prostate gland is moderately enlarged. Other: No abdominal wall hernia or abnormality. No abdominopelvic ascites. Musculoskeletal: Multilevel degenerative changes seen throughout the lumbar spine. IMPRESSION: 1. Mild to moderate severity cardiomegaly. 2. Fatty liver. 3. Small hiatal hernia. 4. Colonic diverticulosis. 5. Enlarged prostate gland. 6. Aortic atherosclerosis. Aortic Atherosclerosis (ICD10-I70.0). Electronically Signed   By: Aram Candela M.D.   On: 08/31/2020 20:20   DG Chest Port 1 View  Result Date: 08/31/2020 CLINICAL DATA:  86 year old male found down, head vomited. EXAM: PORTABLE CHEST 1 VIEW COMPARISON:  Chest radiographs 06/04/2020. FINDINGS: Portable AP upright view at 1654 hours. Stable implanted right chest wall cardiac device. Stable mild cardiomegaly. Other mediastinal contours are within normal limits. Visualized tracheal air column is within normal limits. Allowing for portable technique the lungs are clear. No pneumothorax or pleural effusion is evident. Increased bowel gas in the visible upper abdomen although no dilated loops are evident. No acute osseous abnormality identified. IMPRESSION: 1. No acute cardiopulmonary abnormality. 2. Increased gas in nondilated bowel in the visible upper abdomen. Electronically Signed   By: Odessa Fleming M.D.   On: 08/31/2020 17:18    EKG: I independently viewed the EKG done and my findings are as followed: ***   Assessment/Plan Present on Admission: . Altered mental status  Active Problems:   Altered mental status   DVT prophylaxis: ***   Code Status: ***   Family Communication: ***   Disposition Plan: ***   Consults called: ***   Admission status: ***     Frankey Shown MD [facility] Pager [phone]  If 7PM-7AM, please contact night-coverage [email or web address] Password [email or web address]  08/31/2020, 11:40 PM

## 2020-08-31 NOTE — H&P (Signed)
History and Physical  Charles Morgan OEV:[id number] DOB: 09/03/1930 DOA: 08/31/2020  Referring physician: Gerhard Munch, MD PCP: Patient, No Pcp Per  Patient coming from: Home  Chief Complaint: Altered mental status  HPI: Charles Morgan is a 86 y.o. male with medical history significant for vertebrobasilar insufficiency, multiple episodes of syncope, h/o CVAs, autonomic dysfunction, right lower extremity DVT and hyperlipidemia who presents to the emergency department via EMS due to altered mental status.  Patient states that he slept on the floor yesterday (though, he does not know why) and that he remembered vomiting, but he was too weak to get up.  This morning, daughter found him slumped over on the floor with noted vomit around him, he did complain of abdominal pain at that time and arouses to voice.  EMS was activated and patient was taken to the ED for further evaluation and management. Patient was noted to be admitted from 9/9-9/11 due to similar presentation during which he was diagnosed to have recurrent syncope.  He denies chest pain, shortness of breath, abdominal pain, patient states that he was back to his normal baseline functioning.  ED Course:  In the emergency department, he was hemodynamically stable.  Work-up in the ED showed thrombocytopenia, mild hyperglycemia, lactic acidosis 2.5>2.3, hypoalbuminemia and elevated liver enzymes. CT abdomen and pelvis with contrast showed mild to moderate severe cardiomegaly, fatty liver and small hiatal hernia.  CT of head without contrast showed no acute intracranial abnormality.  Chest x-ray showed no acute cardiopulmonary abnormality but showed increased gas in nondilated bowel in the visible upper abdomen.  IV Solu-Cortef was given, IV hydration was provided.  Hospitalist was asked to admit patient for further evaluation and management.  Review of Systems: Constitutional: Negative for chills and fever.  HENT: Negative for ear pain and sore  throat.   Eyes: Negative for pain and visual disturbance.  Respiratory: Negative for cough, chest tightness and shortness of breath.   Cardiovascular: Negative for chest pain and palpitations.  Gastrointestinal: Positive for abdominal pain and vomiting.  Endocrine: Negative for polyphagia and polyuria.  Genitourinary: Negative for decreased urine volume, dysuria, enuresis Musculoskeletal: Negative for arthralgias and back pain.  Skin: Negative for color change and rash.  Allergic/Immunologic: Negative for immunocompromised state.  Neurological: Negative for tremors, syncope, speech difficulty, weakness, light-headedness and headaches.  Hematological: Does not bruise/bleed easily.  All other systems reviewed and are negative  Past Medical History:  Diagnosis Date  . Aortic stenosis   . Arthritis   . Bradycardia, sinus 2021   Nocturnal bradycardia, not clearly symptomatic or associated with syncope  . CVA (cerebral vascular accident) [facility])    Two events: 2013, 2019  . DVT (deep venous thrombosis) (HCC) Aug '20   right LE  . HLD (hyperlipidemia)   . NSVT (nonsustained ventricular tachycardia) (HCC)    LVEF normal, not associated with syncope  . Proctocolitis without complication Aug '21   CT diagnosis  . Syncope and collapse    Multiple recurrences - vertebrobasilar insufficiency, autonomic dysfunction, ILR placed by cardiologist in [place]  . 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 Neurology eval - no need for further w/u  . Vertebrobasilar artery insufficiency    Past Surgical History:  Procedure Laterality Date  . ABDOMINAL SURGERY    . FLEXIBLE SIGMOIDOSCOPY N/A 06/05/2020   Procedure: FLEXIBLE SIGMOIDOSCOPY;  Surgeon: Malissa Hippo, MD;  Location: [facility];  Service: Endoscopy;  Laterality: N/A;  . HEMORRHOIDECTOMY WITH HEMORRHOID BANDING    .  LOOP RECORDER IMPLANT    . SHOULDER SURGERY    . Thumb surgery      Social History:  reports  that he has never smoked. He has never used smokeless tobacco. He reports that he does not drink alcohol and does not use drugs.   Allergies  Allergen Reactions  . Tuberculin Rash    Family History  Family history unknown: Yes    Prior to Admission medications   Medication Sig Start Date End Date Taking? Authorizing Provider  acetaminophen (TYLENOL) 325 MG tablet Take 325 mg by mouth every 6 (six) hours as needed.  10/03/19  Yes [provider]  ammonium lactate (LAC-HYDRIN) 12 % lotion Apply 1 application topically as needed for dry skin.  03/10/20  Yes [provider]  aspirin EC 81 MG tablet Take 81 mg by mouth daily. Swallow whole.   Yes [provider]  atorvastatin (LIPITOR) 80 MG tablet Take 1 tablet (80 mg total) by mouth daily. 06/06/20  Yes Dahal, Melina Schools, MD  Brinzolamide-Brimonidine 1-0.2 % SUSP Place 1 drop into both eyes in the morning and at bedtime.  12/12/19  Yes [provider]  docusate sodium (COLACE) 100 MG capsule Take 100 mg by mouth 2 (two) times daily.   Yes [provider]  methylPREDNISolone (MEDROL DOSEPAK) 4 MG TBPK tablet See admin instructions. Patient not taking: Reported on 08/31/2020 06/24/20   [provider]    Physical Exam: BP (!) 172/74   Pulse (!) 56   Temp 98.1 F (36.7 C) (Rectal)   Resp 18   Ht 5\' 6"  (1.676 m)   Wt 63.5 kg   SpO2 97%   BMI 22.60 kg/m   . General: 86 y.o. year-old male well developed well nourished in no acute distress.  Alert and oriented x3. 98 HEENT: Dry mucous membrane.  NCAT, EOMI . Neck: Supple, trachea medial . Cardiovascular: Regular rate and rhythm with no rubs or gallops.  No thyromegaly or JVD noted.  No lower extremity edema. 2/4 pulses in all 4 extremities. Marland Kitchen Respiratory: Clear to auscultation with no wheezes or rales. Good inspiratory effort. . Abdomen: Soft nontender nondistended with normal bowel sounds x4 quadrants. . Muskuloskeletal: No cyanosis, clubbing  or edema noted bilaterally . Neuro: CN II-XII intact, strength, sensation, reflexes . Skin: No ulcerative lesions noted or rashes . Psychiatry: Judgement and insight appear normal. Mood is appropriate for condition and setting          Labs on Admission:  Basic Metabolic Panel: Recent Labs  Lab 08/31/20 1644  NA 140  K 4.0  CL 107  CO2 24  GLUCOSE 128*  BUN 25*  CREATININE 1.22  CALCIUM 9.1   Liver Function Tests: Recent Labs  Lab 08/31/20 1644  AST 130*  ALT 143*  ALKPHOS 869*  BILITOT 1.1  PROT 7.5  ALBUMIN 3.3*   No results for input(s): LIPASE, AMYLASE in the last 168 hours. Recent Labs  Lab 08/31/20 2301  AMMONIA 44*   CBC: Recent Labs  Lab 08/31/20 1644  WBC 10.3  NEUTROABS 9.2*  HGB 12.8*  HCT 39.2  MCV 89.9  PLT 121*   Cardiac Enzymes: No results for input(s): CKTOTAL, CKMB, CKMBINDEX, TROPONINI in the last 168 hours.  BNP (last 3 results) No results for input(s): BNP in the last 8760 hours.  ProBNP (last 3 results) No results for input(s): PROBNP in the last 8760 hours.  CBG: No results for input(s): GLUCAP in the last 168 hours.  Radiological Exams on Admission: CT HEAD WO CONTRAST  Result Date: 08/31/2020 CLINICAL DATA:  Altered mental status. EXAM: CT HEAD WITHOUT CONTRAST TECHNIQUE: Contiguous axial images were obtained from the base of the skull through the vertex without intravenous contrast. COMPARISON:  June 04, 2020 FINDINGS: Brain: There is mild to moderate severity cerebral atrophy with widening of the extra-axial spaces and ventricular dilatation. There are areas of decreased attenuation within the white matter tracts of the supratentorial brain, consistent with microvascular disease changes. A stable area of cortical encephalomalacia, with adjacent chronic white matter low attenuation, are seen within the bilateral cerebellum. Vascular: No hyperdense vessel or unexpected calcification. Skull: Normal. Negative for fracture or  focal lesion. Sinuses/Orbits: No acute finding. Other: None. IMPRESSION: 1. Generalized cerebral atrophy with chronic bilateral cerebellar infarcts. 2. No acute intracranial abnormality. Electronically Signed   By: Aram Candela M.D.   On: 08/31/2020 20:15   CT ABDOMEN PELVIS W CONTRAST  Result Date: 08/31/2020 CLINICAL DATA:  Abdominal pain and altered mental status. EXAM: CT ABDOMEN AND PELVIS WITH CONTRAST TECHNIQUE: Multidetector CT imaging of the abdomen and pelvis was performed using the standard protocol following bolus administration of intravenous contrast. CONTRAST:  OMNIPAQUE IOHEXOL 300 MG/ML  SOLN COMPARISON:  May 19, 2020 FINDINGS: Lower chest: There is mild to moderate severity cardiomegaly. Mild atelectasis is seen within the bilateral lung bases. Hepatobiliary: There is diffuse fatty infiltration of the liver parenchyma. No focal liver abnormality is seen. No gallstones or gallbladder wall thickening. Stable dilatation of the common bile duct is noted. Pancreas: Unremarkable. No pancreatic ductal dilatation or surrounding inflammatory changes. Spleen: Normal in size without focal abnormality. Adrenals/Urinary Tract: Adrenal glands are unremarkable. Kidneys are normal in size, without renal calculi or hydronephrosis. Multiple subcentimeter cysts are seen within the right kidney. Bladder is unremarkable. Stomach/Bowel: There is a small hiatal hernia. The appendix is not clearly identified. No evidence of bowel wall dilatation. Noninflamed diverticula are seen throughout the large bowel. Vascular/Lymphatic: Aortic atherosclerosis. No enlarged abdominal or pelvic lymph nodes. Reproductive: The prostate gland is moderately enlarged. Other: No abdominal wall hernia or abnormality. No abdominopelvic ascites. Musculoskeletal: Multilevel degenerative changes seen throughout the lumbar spine. IMPRESSION: 1. Mild to moderate severity cardiomegaly. 2. Fatty liver. 3. Small hiatal hernia. 4.  Colonic diverticulosis. 5. Enlarged prostate gland. 6. Aortic atherosclerosis. Aortic Atherosclerosis (ICD10-I70.0). Electronically Signed   By: Aram Candela M.D.   On: 08/31/2020 20:20   DG Chest Port 1 View  Result Date: 08/31/2020 CLINICAL DATA:  86 year old male found down, head vomited. EXAM: PORTABLE CHEST 1 VIEW COMPARISON:  Chest radiographs 06/04/2020. FINDINGS: Portable AP upright view at 1654 hours. Stable implanted right chest wall cardiac device. Stable mild cardiomegaly. Other mediastinal contours are within normal limits. Visualized tracheal air column is within normal limits. Allowing for portable technique the lungs are clear. No pneumothorax or pleural effusion is evident. Increased bowel gas in the visible upper abdomen although no dilated loops are evident. No acute osseous abnormality identified. IMPRESSION: 1. No acute cardiopulmonary abnormality. 2. Increased gas in nondilated bowel in the visible upper abdomen. Electronically Signed   By: Odessa Fleming M.D.   On: 08/31/2020 17:18    EKG: I independently viewed the EKG done and my findings are as followed: Normal sinus rhythm at a rate of 60 bpm and T wave abnormality.  Assessment/Plan Present on Admission: . Altered mental status . Syncope . Dehydration . HLD (hyperlipidemia) . Transaminasemia . Thrombocytopenia (HCC) . Stroke [facility])  Active  Problems:   Syncope   Dehydration   HLD (hyperlipidemia)   Transaminasemia   Thrombocytopenia (HCC)   Stroke (HCC)   Altered mental status   Lactic acidosis   Hypoalbuminemia   Hyperammonemia (HCC)  Reported altered mental status R/O recurrent syncope Patient's altered mental status appears to have improved, he was alert and oriented x3 and patient states that he is back to his baseline. Orthostatic vital signs will be checked Patient has had many episodes of syncope and several hospitalization within last 9 months with last admission being 3 months ago (September) with  evaluations by cardiology and neurology.  Findings included nocturnal bradycardia which cardiology did not think was causative per medical record at that time Syncopal episodes were thought to be partly attributable to his basilar artery stenosis as well as orthostasis and volume depletion by neurology evaluation per medical record He is currently wearing a loop recorder placed by cardiology in [place] as part of continued syncope work up. CT of head without contrast showed generalized cerebral atrophy with chronic bilateral cerebellar infarcts but no acute intracranial abnormality Continue telemetry Echocardiogram done on 06/05/2020 showed LVEF of 50 to 55% with LV demonstrates no regional wall motion abnormalities and grade 1 diastolic dysfunction Continue fall precaution and neurochecks  Dehydration IV hydration was provided  Lactic acidosis possibly due to dehydration Lactic acid 2.5 > 2.3 >2.3 Continue IV hydration and continue to trend lactic acid  Hyperammonemia Ammonia level 44, continue lactulose  Thrombocytopenia Platelets 121; stable, continue to monitor platelet levels   Transaminasemia  Patient has history of alcohol use-with 3 years ago Apparently, patient initially complained of abdominal pain which is resolved at this time Chest x-ray showed increased gas in nondilated bowel in the visible upper abdomen AST 130 (this was 312 on 9/10) ALT 143 (this was 339 on 9/10) ALP 869 (this was 507 on 9/10) Hepatitis panel done on 9/10 was nonreactive for hepatitis B surface antigen and hepatitis C antibody MRCP done during last admission showed no acute abnormality per medical record Right upper quadrant ultrasound will be checked  History of stroke Patient has history of CVA x2, 2013 and 2019 Continue aspirin and statin Continue PT/OT eval and treat   DVT prophylaxis: Lovenox  Code Status: Full code  Family Communication: None at bedside  Disposition Plan:  Patient  is from:                        home Anticipated DC to:                   SNF or family members home Anticipated DC date:               2-3 days Anticipated DC barriers:           Patient is unstable to be discharged at this time due to presumed recurrent syncope, transaminasemia, lactic acidosis which require further work-up and inpatient management.  Consults called: None  Admission status: Inpatient    Frankey Shownladapo Elad Macphail MD [facility]  09/01/2020, 2:09 AM

## 2020-09-01 ENCOUNTER — Inpatient Hospital Stay (HOSPITAL_COMMUNITY): Payer: Medicare HMO

## 2020-09-01 DIAGNOSIS — E7220 Disorder of urea cycle metabolism, unspecified: Secondary | ICD-10-CM

## 2020-09-01 DIAGNOSIS — E8809 Other disorders of plasma-protein metabolism, not elsewhere classified: Secondary | ICD-10-CM | POA: Diagnosis present

## 2020-09-01 DIAGNOSIS — E872 Acidosis, unspecified: Secondary | ICD-10-CM | POA: Diagnosis present

## 2020-09-01 LAB — COMPREHENSIVE METABOLIC PANEL
ALT: 141 U/L — ABNORMAL HIGH (ref 0–44)
AST: 173 U/L — ABNORMAL HIGH (ref 15–41)
Albumin: 3.5 g/dL (ref 3.5–5.0)
Alkaline Phosphatase: 866 U/L — ABNORMAL HIGH (ref 38–126)
Anion gap: 10 (ref 5–15)
BUN: 19 mg/dL (ref 8–23)
CO2: 22 mmol/L (ref 22–32)
Calcium: 8.8 mg/dL — ABNORMAL LOW (ref 8.9–10.3)
Chloride: 107 mmol/L (ref 98–111)
Creatinine, Ser: 0.98 mg/dL (ref 0.61–1.24)
GFR, Estimated: >60 mL/min (ref >60)
Glucose, Bld: 110 mg/dL — ABNORMAL HIGH (ref 70–99)
Potassium: 3.9 mmol/L (ref 3.5–5.1)
Sodium: 139 mmol/L (ref 135–145)
Total Bilirubin: 1.4 mg/dL — ABNORMAL HIGH (ref 0.3–1.2)
Total Protein: 7.4 g/dL (ref 6.5–8.1)

## 2020-09-01 LAB — CBC
HCT: 43.5 % (ref 39.0–52.0)
Hemoglobin: 14 g/dL (ref 13.0–17.0)
MCH: 29.4 pg (ref 26.0–34.0)
MCHC: 32.2 g/dL (ref 30.0–36.0)
MCV: 91.2 fL (ref 80.0–100.0)
Platelets: 111 10*3/uL — ABNORMAL LOW (ref 150–400)
RBC: 4.77 MIL/uL (ref 4.22–5.81)
RDW: 18.6 % — ABNORMAL HIGH (ref 11.5–15.5)
WBC: 8.3 10*3/uL (ref 4.0–10.5)
nRBC: 0 % (ref 0.0–0.2)

## 2020-09-01 LAB — CBG MONITORING, ED: Glucose-Capillary: 96 mg/dL (ref 70–99)

## 2020-09-01 LAB — PHOSPHORUS: Phosphorus: 2.9 mg/dL (ref 2.5–4.6)

## 2020-09-01 LAB — PROTIME-INR
INR: 1 (ref 0.8–1.2)
Prothrombin Time: 12.9 seconds (ref 11.4–15.2)

## 2020-09-01 LAB — MAGNESIUM: Magnesium: 2.1 mg/dL (ref 1.7–2.4)

## 2020-09-01 LAB — LACTIC ACID, PLASMA
Lactic Acid, Venous: 1.5 mmol/L (ref 0.5–1.9)
Lactic Acid, Venous: 1.9 mmol/L (ref 0.5–1.9)

## 2020-09-01 LAB — APTT: aPTT: 27 seconds (ref 24–36)

## 2020-09-01 MED ORDER — ATORVASTATIN CALCIUM 40 MG PO TABS
80.0000 mg | ORAL_TABLET | Freq: Every day | ORAL | Status: DC
  Administered 2020-09-02: 80 mg via ORAL
  Filled 2020-09-01 (×2): qty 2

## 2020-09-01 MED ORDER — ASPIRIN EC 81 MG PO TBEC
81.0000 mg | DELAYED_RELEASE_TABLET | Freq: Every day | ORAL | Status: DC
  Administered 2020-09-02 – 2020-09-04 (×3): 81 mg via ORAL
  Filled 2020-09-01 (×4): qty 1

## 2020-09-01 MED ORDER — LACTULOSE 10 GM/15ML PO SOLN
20.0000 g | Freq: Three times a day (TID) | ORAL | Status: DC
  Administered 2020-09-01 – 2020-09-04 (×6): 20 g via ORAL
  Filled 2020-09-01 (×8): qty 30

## 2020-09-01 MED ORDER — IOHEXOL 350 MG/ML SOLN
75.0000 mL | Freq: Once | INTRAVENOUS | Status: AC | PRN
  Administered 2020-09-01: 75 mL via INTRAVENOUS

## 2020-09-01 MED ORDER — SODIUM CHLORIDE 0.9 % IV SOLN: Freq: Once | INTRAVENOUS | Status: DC

## 2020-09-01 NOTE — ED Notes (Signed)
This RN rounded on patient. Found patient's bed sheets to be soild. Linens changed, provided a new external cath

## 2020-09-01 NOTE — Progress Notes (Signed)
[facility]LE[facility]  CONSULT   Date of service: September 01, 2020 Patient Name: Charles Morgan MRN:  [id number] DOB:  [birth year] Requesting Provider: Dr. Joen Laura Location of the provider: [facility] Neurohospitalist office Location of the patient: [facility] hospital emergency dept, bed 19 Reason for consult: "Stroke code for R arm weakness"   This consult was provided via telemedicine with 2-way video and audio communication. The patient/family was informed that care would be provided in this way and agreed to receive care in this manner.   History of Present Illness   Mr. Charles Morgan is a 86 y.o. male with hx of vertibrobasilar insufficiency, syncope, prior CVA, autonomic dysfunction, RLE DVT, HLD who is admitted with AMS. Found to have thrombocytopenia, mild hyperglycemia, lactic acidosis 2.5>2.3, hypoalbuminemia and elevated liver enzymes. CT abdomen and pelvis with contrast showed mild to moderate severe cardiomegaly, fatty liver and small hiatal hernia.  CT of head without contrast showed no acute intracranial abnormality.  His mentation improved and he reports that he has new R arm weakness. He endorses a history of carpal tunnel in his R hand but this has never been this worse. He reports a symptom onset of 0200 on 09/01/20.   Stroke Measures   Last Known Well: 0200 on 09/01/20 TPA Given: no IR Thrombectomy: No LVO mRS: Modified Rankin Scale: 3-Moderate disability-requires help but walks WITHOUT assistance Time of teleneurologist evaluation: 1155 on 09/01/20.  Vitals   Vitals:   09/01/20 0730 09/01/20 0800 09/01/20 0947 09/01/20 1144  BP: (!) 152/68 (!) 162/74 (!) 151/81 (!) 155/77  Pulse:  63 (!) 53 (!) 51  Resp:  18 18 16   Temp:      TempSrc:      SpO2:  100% 99% 100%  Weight:      Height:         Body mass index is 22.6 kg/m.   Tele-neuro Exam and NIHSS   General: Awake, alert talking.   NIHSS components Score: Comment  1a Level of  Conscious 0[x]  1[]  2[]  3[]      1b LOC Questions 0[x]  1[]  2[]       1c LOC Commands 0[x]  1[]  2[]       2 Best Gaze 0[x]  1[]  2[]       3 Visual 0[]  1[x]  2[]  3[]    Bl upper fields visual field cut.  4 Facial Palsy 0[x]  1[]  2[]  3[]      5a Motor Arm - left 0[x]  1[]  2[]  3[]  4[]  UN[]    5b Motor Arm - Right 0[]  1[x]  2[]  3[]  4[]  UN[]  R hand weakness.  6a Motor Leg - Left 0[x]  1[]  2[]  3[]  4[]  UN[]    6b Motor Leg - Right 0[x]  1[]  2[]  3[]  4[]  UN[]    7 Limb Ataxia 0[x]  1[]  2[]  3[]  UN[]   No ataxia out of proportion to weakness.  8 Sensory 0[]  1[x]  2[]  UN[]    R face decreased  9 Best Language 0[x]  1[]  2[]  3[]      10 Dysarthria 0[x]  1[]  2[]  UN[]      11 Extinct. and Inattention 0[x]  1[]  2[]       TOTAL: 0    Other exam findings: R arm does not have a drift but appears to have significant weakness at the elbow and at the wrist with difficulty extending at the elbow.  Imaging and Labs   CBC:  Recent Labs  Lab 08/31/20 1644 09/01/20 0533  WBC 10.3 8.3  NEUTROABS 9.2*  --   HGB 12.8* 14.0  HCT 39.2 43.5  MCV  89.9 91.2  PLT 121* 111*    Basic Metabolic Panel:  Lab Results  Component Value Date   NA 139 09/01/2020   K 3.9 09/01/2020   CO2 22 09/01/2020   GLUCOSE 110 (H) 09/01/2020   BUN 19 09/01/2020   CREATININE 0.98 09/01/2020   CALCIUM 8.8 (L) 09/01/2020   GFRNONAA >60 09/01/2020   GFRAA >60 06/20/2020   Lipid Panel: No results found for: LDLCALC HgbA1c:  Lab Results  Component Value Date   HGBA1C 6.2 (H) 06/04/2020   Urine Drug Screen:     Component Value Date/Time   LABOPIA NONE DETECTED 06/04/2020 1436   COCAINSCRNUR NONE DETECTED 06/04/2020 1436   LABBENZ NONE DETECTED 06/04/2020 1436   AMPHETMU NONE DETECTED 06/04/2020 1436   THCU NONE DETECTED 06/04/2020 1436   LABBARB NONE DETECTED 06/04/2020 1436    Alcohol Level     Component Value Date/Time   ETH <10 08/31/2020 2123   CT Head without contrast: CTH was negative for a large hypodensity concerning for a large  territory infarct or hyperdensity concerning for an ICH  CT angio head and neck: No LVO, vertebrobasilar insufficiency  Impression   Charles Morgan is a 86 y.o. male with PMH significant for syncope, prior CVA, autonomic dysfunction, RLE DVT, HLD who I evaluated for worsening RUE weakness in the setting of known hx of prior carpal tunnel in R hand. On my limited evaluation, he did seem to have some RUE drift, difficulty with extending at the elbow and extending the fingers, althou this was very limited as I evaluated him on video. He also reported numbness in his R face which again would not fit with carpal tunnel.  CTH and CTA were negative for any acute stroke, no LVO. He was outside tPA window and not a candidate for thrombectomy due to no LVO.  Recommendations  - Recommend MRI Brain without contrast - Agree with continuing home Aspirin 81mg  daily. - routine neurology consult for further evaluation. ______________________________________________________________________    This patient is receiving care for possible acute neurological changes. There was 35 minutes of care by this provider at the time of service, including time for direct evaluation via telemedicine, review of medical records, imaging studies and discussion of findings with providers, the patient and/or family.  [facility] Pager Number Erick Blinks  If 7pm- 7am, please page neurology on call as listed in AMION.

## 2020-09-01 NOTE — ED Notes (Signed)
Family and lab at bedside. Family provided an update on current plan of care, in agreement at this time.

## 2020-09-01 NOTE — ED Notes (Addendum)
Admitting provider at bedside.  While away from bedside, this RN was educated that patient was reporting numbness to the right arm as well as a "lack of control."  This RN and ED staff at bedside with admitting provider. Found patient to be alert and oriented with limb ataxia to the RUE. Pt reports to staff a history of carpal tunnel but "this feels different." Pt reports "It started around 2 or 3 this morning." Per admitting provider, will evaluate at this time. This RN verbalized understanding at this time.

## 2020-09-01 NOTE — Therapy (Signed)
PT Cancellation Note  Patient Details Name: Charles Morgan MRN: [id number] DOB: March 20, 1930   Cancelled Treatment:    Reason Eval/Treat Not Completed: Other (comment) (code Stroke). Per chart review, code stroke activated and pt undergoing further diagnostic testing (CT and MRI). Will check back tomorrow for eval.   Domenick Bookbinder PT, DPT 09/01/20, 4:02 PM [phone]

## 2020-09-01 NOTE — ED Notes (Signed)
Pt to CT at this time, transported by RN with continuous cardiac monitor in place.  12:38 PM Pt returned to room from CT at this time.

## 2020-09-01 NOTE — ED Notes (Addendum)
Code stroke activated at 1146 per Admitting provider.  11:50 AM CBG 96  11:57 AM TeleNeurology speaking with patient at this time.

## 2020-09-01 NOTE — ED Notes (Signed)
Pt reporting that right arm "feels different." Pt reports "I keep hitting myself in the face when I hold my arm up." MD made aware at this time.

## 2020-09-01 NOTE — Progress Notes (Signed)
[facility] PROGRESS NOTE    Nicholai Willette  ZOX:[id number] DOB: August 03, 1930 DOA: 08/31/2020 PCP: Patient, No Pcp Per      Brief Narrative:  Mr. Tardif is a 86 y.o. M with vertebrobasilar insufficiency, recurrent syncope, history stroke, aortic stenosis, history of DVT, autonomic dysfunction, and recent admission for confusion who presented with weakness, vomiting, and confusion.  Caveat that patient's baseline cognitive function is unclear, and he is able to provide only limited history.  From initial reports, it appears the patient has moved from [place] for family to care for him.  He was admitted for similar presentation in September, since then has been home until the day of admission, was found on the floor, thought he slept there maybe, but not sure why, had vomited on himself and was too weak and sluggish to get up.  In the ER, mild lactic acidosis, mild transaminitis, CT abdomen and pelvis showed fatty liver, CT head unremarkable, IV fluids and Solu-cortef given.           Assessment & Plan:  Moderate protein calorie malnutrition Dementia Failure to thrive There was concern for chronic adrenal insufficiency by EDP.   -Follow cortisol from this morning -PT eval -TOC consult    Cerebrovascular disease Vertebrobasilar insufficiency and recurrent syncope Patient has right sided weakness.  He initially did not complain of this to EDP nor admitting MD.    This morning, he complained to nursing and myself it is new since yesterday.  CODE STROKE called, evaluated by Neurology, CTA showed no LVO.  MRI brain showed no stroke.  Exams documented on last admission note no right hemiparesis.  Patient relates longterm carpal tunnel.  Given absence of finding on MRI, I suspect this is neurolysed weakness superimposed on chronic peripheral neuropathy of the right hand. -PT eval -Continue home aspirin and atorvastatin  DVT Old, not on  AC.  Cirrhosis Thrombocytopenia Unclear cause.  Ammonia 44, I do not suspect this is hepatic encephalopathy. -Continue lactulose         Disposition: Status is: Inpatient  Remains inpatient appropriate because:Unsafe d/c plan   Dispo: The patient is from: Home              Anticipated d/c is to: SNF              Anticipated d/c date is: > 3 days              Patient currently is medically stable to d/c.    The patient is 86 years old, admitted for weakness, debility, declining functional status, declining cognition.  Appears to be failing to thrive.  Doubt this is infection.  Stroke ruled out.  Doubt hepatic encpehaloapthy, dehydration, as no improvement with fluids and resuming lactulose.  Will rule out adrenal insufficiency and obtain PT eval.  Likely will need placement.  Paitent's daughter is only listed contact, she refused to be contacted further. Recommended only to contact the patient's granddaughter.          MDM: The below labs and imaging reports were reviewed and summarized above.  Medication management as above.   DVT prophylaxis: enoxaparin (LOVENOX) injection 40 mg Start: 09/01/20 1000 SCDs Start: 08/31/20 2352  Code Status: DNR Family Communication: Daughter, she refused further contact, who recommended we contact patient's granddaughter, stated she did not have granddaughters phone number            Subjective: Right arm weakness.  Generalized weakness.  No headache, chest pain, abdominal  pain, fever, respiratory distress.  Objective: Vitals:   09/01/20 1240 09/01/20 1300 09/01/20 1400 09/01/20 1530  BP: (!) 154/71  (!) 152/87 (!) 155/75  Pulse: (!) 53  (!) 55 (!) 50  Resp: 14 18 16 11   Temp:      TempSrc:      SpO2: 99%  99% 99%  Weight:      Height:        Intake/Output Summary (Last 24 hours) at 09/01/2020 1835 Last data filed at 08/31/2020 1932 Gross per 24 hour  Intake 1000 ml  Output --  Net 1000 ml   Filed Weights    08/31/20 1628  Weight: 63.5 kg    Examination: General appearance: Thin adult male, awake but sluggish, no obvious distress.   HEENT: Anicteric, conjunctiva pink, lids and lashes normal. No nasal deformity, discharge, epistaxis.  Lips moist, edentulous, oropharynx moist, no oral lesions, hearing normal.   Skin: Warm and dry.  No jaundice.  No suspicious rashes or lesions. Cardiac: RRR, nl S1-S2, no murmurs appreciated.  Capillary refill is brisk.  JVP normal.  No LE edema.  Radial pulses 2+ and symmetric. Respiratory: Normal respiratory rate and rhythm.  CTAB without rales or wheezes. Abdomen: Abdomen soft.  No TTP or guarding. No ascites, distension, hepatosplenomegaly.   MSK: No deformities or effusions.  Severe diffuse loss of subcutaneous muscle mass and fat Neuro: Awake and alert.  EOMI, right arm weakness is noted.  Some contractures of the right hand is noted.  Right arm strength 3/5 relative to 4/5 in the left.  Speech slightly dysarthric. Psych: Sensorium intact and responding to questions, attention diminished. Affect blunted.  Judgment and insight appear severely impaired.    Data Reviewed: I have personally reviewed following labs and imaging studies:  CBC: Recent Labs  Lab 08/31/20 1644 09/01/20 0533  WBC 10.3 8.3  NEUTROABS 9.2*  --   HGB 12.8* 14.0  HCT 39.2 43.5  MCV 89.9 91.2  PLT 121* 111*   Basic Metabolic Panel: Recent Labs  Lab 08/31/20 1644 09/01/20 0533  NA 140 139  K 4.0 3.9  CL 107 107  CO2 24 22  GLUCOSE 128* 110*  BUN 25* 19  CREATININE 1.22 0.98  CALCIUM 9.1 8.8*  MG  --  2.1  PHOS  --  2.9   GFR: Estimated Creatinine Clearance: 46.8 mL/min (by C-G formula based on SCr of 0.98 mg/dL). Liver Function Tests: Recent Labs  Lab 08/31/20 1644 09/01/20 0533  AST 130* 173*  ALT 143* 141*  ALKPHOS 869* 866*  BILITOT 1.1 1.4*  PROT 7.5 7.4  ALBUMIN 3.3* 3.5   No results for input(s): LIPASE, AMYLASE in the last 168 hours. Recent Labs   Lab 08/31/20 2301  AMMONIA 44*   Coagulation Profile: Recent Labs  Lab 08/31/20 1644 09/01/20 0533  INR 1.0 1.0   Cardiac Enzymes: No results for input(s): CKTOTAL, CKMB, CKMBINDEX, TROPONINI in the last 168 hours. BNP (last 3 results) No results for input(s): PROBNP in the last 8760 hours. HbA1C: No results for input(s): HGBA1C in the last 72 hours. CBG: Recent Labs  Lab 09/01/20 1150  GLUCAP 96   Lipid Profile: No results for input(s): CHOL, HDL, LDLCALC, TRIG, CHOLHDL, LDLDIRECT in the last 72 hours. Thyroid Function Tests: No results for input(s): TSH, T4TOTAL, FREET4, T3FREE, THYROIDAB in the last 72 hours. Anemia Panel: No results for input(s): VITAMINB12, FOLATE, FERRITIN, TIBC, IRON, RETICCTPCT in the last 72 hours. Urine analysis:    Component  Value Date/Time   COLORURINE AMBER (A) 08/31/2020 1645   APPEARANCEUR HAZY (A) 08/31/2020 1645   LABSPEC 1.018 08/31/2020 1645   PHURINE 5.0 08/31/2020 1645   GLUCOSEU NEGATIVE 08/31/2020 1645   HGBUR MODERATE (A) 08/31/2020 1645   BILIRUBINUR NEGATIVE 08/31/2020 1645   KETONESUR NEGATIVE 08/31/2020 1645   PROTEINUR 100 (A) 08/31/2020 1645   NITRITE NEGATIVE 08/31/2020 1645   LEUKOCYTESUR NEGATIVE 08/31/2020 1645   Sepsis Labs: @LABRCNTIP (procalcitonin:4,lacticacidven:4)  ) Recent Results (from the past 240 hour(s))  Blood Cultures (routine x 2)     Status: None (Preliminary result)   Collection Time: 08/31/20  4:45 PM   Specimen: BLOOD RIGHT FOREARM  Result Value Ref Range Status   Specimen Description BLOOD RIGHT FOREARM  Final   Special Requests   Final    BOTTLES DRAWN AEROBIC AND ANAEROBIC Blood Culture adequate volume   Culture  Setup Time   Final    GRAM POSITIVE RODS AEROBIC BOTTLE ONLY Gram Stain Report Called to,Read Back By and Verified With: DOSS,MICHAEL RN @0802  09/01/20 BY JONES,T [facility] Performed at [facility], [address] [place], [place] [address]    Culture PENDING  Incomplete    Report Status PENDING  Incomplete  Resp Panel by RT-PCR (Flu A&B, Covid) Nasopharyngeal Swab     Status: None   Collection Time: 08/31/20  6:13 PM   Specimen: Nasopharyngeal Swab; Nasopharyngeal(NP) swabs in vial transport medium  Result Value Ref Range Status   SARS Coronavirus 2 by RT PCR NEGATIVE NEGATIVE Final    Comment: (NOTE) SARS-CoV-2 target nucleic acids are NOT DETECTED.  The SARS-CoV-2 RNA is generally detectable in upper respiratory specimens during the acute phase of infection. The lowest concentration of SARS-CoV-2 viral copies this assay can detect is 138 copies/mL. A negative result does not preclude SARS-Cov-2 infection and should not be used as the sole basis for treatment or other patient management decisions. A negative result may occur with  improper specimen collection/handling, submission of specimen other than nasopharyngeal swab, presence of viral mutation(s) within the areas targeted by this assay, and inadequate number of viral copies(<138 copies/mL). A negative result must be combined with clinical observations, patient history, and epidemiological information. The expected result is Negative.  Fact Sheet for Patients:  BloggerCourse.com  Fact Sheet for Healthcare Providers:  SeriousBroker.it  This test is no t yet approved or cleared by the [place] FDA and  has been authorized for detection and/or diagnosis of SARS-CoV-2 by FDA under an Emergency Use Authorization (EUA). This EUA will remain  in effect (meaning this test can be used) for the duration of the COVID-19 declaration under Section 564(b)(1) of the Act, 21 U.S.C.section 360bbb-3(b)(1), unless the authorization is terminated  or revoked sooner.       Influenza A by PCR NEGATIVE NEGATIVE Final   Influenza B by PCR NEGATIVE NEGATIVE Final    Comment: (NOTE) The Xpert Xpress SARS-CoV-2/FLU/RSV plus assay is intended as an aid in the  diagnosis of influenza from Nasopharyngeal swab specimens and should not be used as a sole basis for treatment. Nasal washings and aspirates are unacceptable for Xpert Xpress SARS-CoV-2/FLU/RSV testing.  Fact Sheet for Patients: BloggerCourse.com  Fact Sheet for Healthcare Providers: SeriousBroker.it  This test is not yet approved or cleared by the [place] FDA and has been authorized for detection and/or diagnosis of SARS-CoV-2 by FDA under an Emergency Use Authorization (EUA). This EUA will remain in effect (meaning this test can be used) for the duration of  the COVID-19 declaration under Section 564(b)(1) of the Act, 21 U.S.C. section 360bbb-3(b)(1), unless the authorization is terminated or revoked.  Performed at [facility], [address], [place], [place] [address]   Blood Cultures (routine x 2)     Status: None (Preliminary result)   Collection Time: 08/31/20  9:23 PM   Specimen: BLOOD RIGHT ARM  Result Value Ref Range Status   Specimen Description BLOOD RIGHT ARM  Final   Special Requests   Final    BOTTLES DRAWN AEROBIC AND ANAEROBIC Blood Culture adequate volume Performed at [facility], [address] [place], [place] [address]    Culture PENDING  Incomplete   Report Status PENDING  Incomplete         Radiology Studies: CT HEAD WO CONTRAST  Result Date: 08/31/2020 CLINICAL DATA:  Altered mental status. EXAM: CT HEAD WITHOUT CONTRAST TECHNIQUE: Contiguous axial images were obtained from the base of the skull through the vertex without intravenous contrast. COMPARISON:  June 04, 2020 FINDINGS: Brain: There is mild to moderate severity cerebral atrophy with widening of the extra-axial spaces and ventricular dilatation. There are areas of decreased attenuation within the white matter tracts of the supratentorial brain, consistent with microvascular disease changes. A stable area of cortical encephalomalacia,  with adjacent chronic white matter low attenuation, are seen within the bilateral cerebellum. Vascular: No hyperdense vessel or unexpected calcification. Skull: Normal. Negative for fracture or focal lesion. Sinuses/Orbits: No acute finding. Other: None. IMPRESSION: 1. Generalized cerebral atrophy with chronic bilateral cerebellar infarcts. 2. No acute intracranial abnormality. Electronically Signed   By: Aram Candela M.D.   On: 08/31/2020 20:15   MR BRAIN WO CONTRAST  Result Date: 09/01/2020 CLINICAL DATA:  Right upper extremity weakness EXAM: MRI HEAD WITHOUT CONTRAST TECHNIQUE: Multiplanar, multiecho pulse sequences of the brain and surrounding structures were obtained without intravenous contrast. COMPARISON:  06/05/2020 FINDINGS: Brain: There is no acute infarction or intracranial hemorrhage. There is no intracranial mass, mass effect, or edema. There is no hydrocephalus or extra-axial fluid collection. Ventricles and sulci are prominent reflecting generalized parenchymal volume loss. Patchy and confluent areas of T2 hyperintensity in the supratentorial white matter are nonspecific but probably reflect moderate chronic microvascular ischemic changes. There are chronic bilateral cerebellar and left thalamic infarcts. Vascular: Major vessel flow voids at the skull base are preserved. Skull and upper cervical spine: Normal marrow signal is preserved. Degenerative changes of the included cervical spine. Sinuses/Orbits: Minor mucosal thickening. Bilateral lens replacements. Other: Sella is unremarkable. Patchy right mastoid fluid opacification. IMPRESSION: No acute infarction, hemorrhage, or mass. Chronic microvascular ischemic changes and chronic infarcts as seen previously. Electronically Signed   By: Guadlupe Spanish M.D.   On: 09/01/2020 14:48   CT ABDOMEN PELVIS W CONTRAST  Result Date: 08/31/2020 CLINICAL DATA:  Abdominal pain and altered mental status. EXAM: CT ABDOMEN AND PELVIS WITH CONTRAST  TECHNIQUE: Multidetector CT imaging of the abdomen and pelvis was performed using the standard protocol following bolus administration of intravenous contrast. CONTRAST:  OMNIPAQUE IOHEXOL 300 MG/ML  SOLN COMPARISON:  May 19, 2020 FINDINGS: Lower chest: There is mild to moderate severity cardiomegaly. Mild atelectasis is seen within the bilateral lung bases. Hepatobiliary: There is diffuse fatty infiltration of the liver parenchyma. No focal liver abnormality is seen. No gallstones or gallbladder wall thickening. Stable dilatation of the common bile duct is noted. Pancreas: Unremarkable. No pancreatic ductal dilatation or surrounding inflammatory changes. Spleen: Normal in size without focal abnormality. Adrenals/Urinary Tract: Adrenal glands are  unremarkable. Kidneys are normal in size, without renal calculi or hydronephrosis. Multiple subcentimeter cysts are seen within the right kidney. Bladder is unremarkable. Stomach/Bowel: There is a small hiatal hernia. The appendix is not clearly identified. No evidence of bowel wall dilatation. Noninflamed diverticula are seen throughout the large bowel. Vascular/Lymphatic: Aortic atherosclerosis. No enlarged abdominal or pelvic lymph nodes. Reproductive: The prostate gland is moderately enlarged. Other: No abdominal wall hernia or abnormality. No abdominopelvic ascites. Musculoskeletal: Multilevel degenerative changes seen throughout the lumbar spine. IMPRESSION: 1. Mild to moderate severity cardiomegaly. 2. Fatty liver. 3. Small hiatal hernia. 4. Colonic diverticulosis. 5. Enlarged prostate gland. 6. Aortic atherosclerosis. Aortic Atherosclerosis (ICD10-I70.0). Electronically Signed   By: Aram Candela M.D.   On: 08/31/2020 20:20   DG Chest Port 1 View  Result Date: 08/31/2020 CLINICAL DATA:  86 year old male found down, head vomited. EXAM: PORTABLE CHEST 1 VIEW COMPARISON:  Chest radiographs 06/04/2020. FINDINGS: Portable AP upright view at 1654 hours.  Stable implanted right chest wall cardiac device. Stable mild cardiomegaly. Other mediastinal contours are within normal limits. Visualized tracheal air column is within normal limits. Allowing for portable technique the lungs are clear. No pneumothorax or pleural effusion is evident. Increased bowel gas in the visible upper abdomen although no dilated loops are evident. No acute osseous abnormality identified. IMPRESSION: 1. No acute cardiopulmonary abnormality. 2. Increased gas in nondilated bowel in the visible upper abdomen. Electronically Signed   By: Odessa Fleming M.D.   On: 08/31/2020 17:18   CT HEAD CODE STROKE WO CONTRAST  Result Date: 09/01/2020 CLINICAL DATA:  Code stroke.  Right arm drift, numbness and tingling EXAM: CT HEAD WITHOUT CONTRAST CT ANGIOGRAPHY OF THE HEAD AND NECK TECHNIQUE: Contiguous axial images were obtained from the base of the skull through the vertex without intravenous contrast. Multidetector CT imaging of the head and neck was performed using the standard protocol during bolus administration of intravenous contrast. Multiplanar CT image reconstructions and MIPs were obtained to evaluate the vascular anatomy. Carotid stenosis measurements (when applicable) are obtained utilizing NASCET criteria, using the distal internal carotid diameter as the denominator. CONTRAST:  43mL OMNIPAQUE IOHEXOL 350 MG/ML SOLN COMPARISON:  CT head 08/31/2020 FINDINGS: CT HEAD Brain: There is no acute intracranial hemorrhage, mass effect, or edema. No new loss of gray-white differentiation. Age-indeterminate small vessel infarct of the left thalamus not definitely present on more remote studies. Chronic bilateral cerebellar infarcts are again seen. Patchy and confluent areas of hypoattenuation in the supratentorial white matter likely reflects stable chronic microvascular ischemic changes. There is no extra-axial fluid collection. Ventricles and sulci are stable in size and configuration. ASPECTS [facility]  Stroke Program Early CT Score) - Ganglionic level infarction (caudate, lentiform nuclei, internal capsule, insula, M1-M3 cortex): 7 - Supraganglionic infarction (M4-M6 cortex): 3 Total score (0-10 with 10 being normal): 10 Vascular: No hyperdense vessel.There is atherosclerotic calcification at the skull base. Skull: Calvarium is unremarkable. Sinuses/Orbits: No acute finding. Other: None. Review of the MIP images confirms the above findings CTA NECK Aortic arch: Great vessel origins are patent. Right carotid system: Patent. Trace calcified plaque at the ICA origin. No measurable stenosis. Left carotid system: Patent. No measurable stenosis at the ICA origin. Vertebral arteries: Patent. Skeleton: Advanced degenerative changes of the cervical spine. Other neck: No mass or adenopathy. Upper chest: No apical lung mass. Review of the MIP images confirms the above findings CTA HEAD Anterior circulation: Intracranial internal carotid arteries are patent with calcified plaque but no significant stenosis. Anterior and  middle cerebral arteries are patent. Posterior circulation: Intracranial vertebral arteries are patent. Patent PICA origins. Right vertebral artery becomes diminutive after PICA origin. Basilar artery is patent. There is focal marked stenosis at the level of the mid basilar. Small caliber distal basilar due to fetal or near fetal origin of both posterior cerebral arteries. Moderate stenosis of the proximal right P2 PCA. Mild, probably atherosclerotic irregularity of the left posterior communicating artery. Venous sinuses: Patent as allowed by contrast bolus timing. Review of the MIP images confirms the above findings IMPRESSION: No acute intracranial hemorrhage or evidence of acute infarction. 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 No large vessel occlusion or hemodynamically significant stenosis in the neck. No proximal intracranial vessel occlusion. Short segment marked stenosis of the mid  basilar. Moderate stenosis proximal right P2 PCA. These results were communicated to Dr. Derry Lory At 12:24 pm on 09/01/2020 by text page via the [facility] messaging system. Electronically Signed   By: Guadlupe Spanish M.D.   On: 09/01/2020 12:54   CT ANGIO HEAD CODE STROKE  Result Date: 09/01/2020 CLINICAL DATA:  Code stroke.  Right arm drift, numbness and tingling EXAM: CT HEAD WITHOUT CONTRAST CT ANGIOGRAPHY OF THE HEAD AND NECK TECHNIQUE: Contiguous axial images were obtained from the base of the skull through the vertex without intravenous contrast. Multidetector CT imaging of the head and neck was performed using the standard protocol during bolus administration of intravenous contrast. Multiplanar CT image reconstructions and MIPs were obtained to evaluate the vascular anatomy. Carotid stenosis measurements (when applicable) are obtained utilizing NASCET criteria, using the distal internal carotid diameter as the denominator. CONTRAST:  75mL OMNIPAQUE IOHEXOL 350 MG/ML SOLN COMPARISON:  CT head 08/31/2020 FINDINGS: CT HEAD Brain: There is no acute intracranial hemorrhage, mass effect, or edema. No new loss of gray-white differentiation. Age-indeterminate small vessel infarct of the left thalamus not definitely present on more remote studies. Chronic bilateral cerebellar infarcts are again seen. Patchy and confluent areas of hypoattenuation in the supratentorial white matter likely reflects stable chronic microvascular ischemic changes. There is no extra-axial fluid collection. Ventricles and sulci are stable in size and configuration. ASPECTS [facility] Stroke Program Early CT Score) - Ganglionic level infarction (caudate, lentiform nuclei, internal capsule, insula, M1-M3 cortex): 7 - Supraganglionic infarction (M4-M6 cortex): 3 Total score (0-10 with 10 being normal): 10 Vascular: No hyperdense vessel.There is atherosclerotic calcification at the skull base. Skull: Calvarium is unremarkable. Sinuses/Orbits: No  acute finding. Other: None. Review of the MIP images confirms the above findings CTA NECK Aortic arch: Great vessel origins are patent. Right carotid system: Patent. Trace calcified plaque at the ICA origin. No measurable stenosis. Left carotid system: Patent. No measurable stenosis at the ICA origin. Vertebral arteries: Patent. Skeleton: Advanced degenerative changes of the cervical spine. Other neck: No mass or adenopathy. Upper chest: No apical lung mass. Review of the MIP images confirms the above findings CTA HEAD Anterior circulation: Intracranial internal carotid arteries are patent with calcified plaque but no significant stenosis. Anterior and middle cerebral arteries are patent. Posterior circulation: Intracranial vertebral arteries are patent. Patent PICA origins. Right vertebral artery becomes diminutive after PICA origin. Basilar artery is patent. There is focal marked stenosis at the level of the mid basilar. Small caliber distal basilar due to fetal or near fetal origin of both posterior cerebral arteries. Moderate stenosis of the proximal right P2 PCA. Mild, probably atherosclerotic irregularity of the left posterior communicating artery. Venous sinuses: Patent as allowed by contrast bolus timing. Review of the  MIP images confirms the above findings IMPRESSION: No acute intracranial hemorrhage or evidence of acute infarction. 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 No large vessel occlusion or hemodynamically significant stenosis in the neck. No proximal intracranial vessel occlusion. Short segment marked stenosis of the mid basilar. Moderate stenosis proximal right P2 PCA. These results were communicated to Dr. Derry Lory At 12:24 pm on 09/01/2020 by text page via the [facility] messaging system. Electronically Signed   By: Guadlupe Spanish M.D.   On: 09/01/2020 12:54   CT ANGIO NECK CODE STROKE  Result Date: 09/01/2020 CLINICAL DATA:  Code stroke.  Right arm drift, numbness and tingling  EXAM: CT HEAD WITHOUT CONTRAST CT ANGIOGRAPHY OF THE HEAD AND NECK TECHNIQUE: Contiguous axial images were obtained from the base of the skull through the vertex without intravenous contrast. Multidetector CT imaging of the head and neck was performed using the standard protocol during bolus administration of intravenous contrast. Multiplanar CT image reconstructions and MIPs were obtained to evaluate the vascular anatomy. Carotid stenosis measurements (when applicable) are obtained utilizing NASCET criteria, using the distal internal carotid diameter as the denominator. CONTRAST:  33mL OMNIPAQUE IOHEXOL 350 MG/ML SOLN COMPARISON:  CT head 08/31/2020 FINDINGS: CT HEAD Brain: There is no acute intracranial hemorrhage, mass effect, or edema. No new loss of gray-white differentiation. Age-indeterminate small vessel infarct of the left thalamus not definitely present on more remote studies. Chronic bilateral cerebellar infarcts are again seen. Patchy and confluent areas of hypoattenuation in the supratentorial white matter likely reflects stable chronic microvascular ischemic changes. There is no extra-axial fluid collection. Ventricles and sulci are stable in size and configuration. ASPECTS [facility] Stroke Program Early CT Score) - Ganglionic level infarction (caudate, lentiform nuclei, internal capsule, insula, M1-M3 cortex): 7 - Supraganglionic infarction (M4-M6 cortex): 3 Total score (0-10 with 10 being normal): 10 Vascular: No hyperdense vessel.There is atherosclerotic calcification at the skull base. Skull: Calvarium is unremarkable. Sinuses/Orbits: No acute finding. Other: None. Review of the MIP images confirms the above findings CTA NECK Aortic arch: Great vessel origins are patent. Right carotid system: Patent. Trace calcified plaque at the ICA origin. No measurable stenosis. Left carotid system: Patent. No measurable stenosis at the ICA origin. Vertebral arteries: Patent. Skeleton: Advanced degenerative  changes of the cervical spine. Other neck: No mass or adenopathy. Upper chest: No apical lung mass. Review of the MIP images confirms the above findings CTA HEAD Anterior circulation: Intracranial internal carotid arteries are patent with calcified plaque but no significant stenosis. Anterior and middle cerebral arteries are patent. Posterior circulation: Intracranial vertebral arteries are patent. Patent PICA origins. Right vertebral artery becomes diminutive after PICA origin. Basilar artery is patent. There is focal marked stenosis at the level of the mid basilar. Small caliber distal basilar due to fetal or near fetal origin of both posterior cerebral arteries. Moderate stenosis of the proximal right P2 PCA. Mild, probably atherosclerotic irregularity of the left posterior communicating artery. Venous sinuses: Patent as allowed by contrast bolus timing. Review of the MIP images confirms the above findings IMPRESSION: No acute intracranial hemorrhage or evidence of acute infarction. 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 No large vessel occlusion or hemodynamically significant stenosis in the neck. No proximal intracranial vessel occlusion. Short segment marked stenosis of the mid basilar. Moderate stenosis proximal right P2 PCA. These results were communicated to Dr. Derry Lory At 12:24 pm on 09/01/2020 by text page via the [facility] messaging system. Electronically Signed   By: Guadlupe Spanish M.D.   On: 09/01/2020  12:54   US Abdomen Limited RUQ (LIVER/GB)  Result Date: 09/01/2020 CLINICAL DATA:  Elevated liver function tests. EXAM: ULTRASOUND ABDOMEN LIMITED RIGHT UPPER QUADRANT COMPARISON:  August 31, 2020. FINDINGS: Gallbladder: No gallstones or wall thickening visualized. No sonographic Murphy sign noted by sonographer. Common bile duct: Diameter: 12 mm proximally which is dilated. Liver: No focal lesion identified. Within normal limits in parenchymal echogenicity. Portal vein is patent on  color Doppler imaging with normal direction of blood flow towards the liver. Other: None. IMPRESSION: Dilated common bile duct is noted. Correlation with liver function tests is recommended to evaluate for possible distal common bile duct obstruction. No other abnormality seen in the right upper quadrant of the abdomen. Electronically Signed   By: Lupita Raider M.D.   On: 09/01/2020 09:20        Scheduled Meds: . aspirin EC  81 mg Oral Daily  . atorvastatin  80 mg Oral Daily  . enoxaparin (LOVENOX) injection  40 mg Subcutaneous Q24H  . lactulose  20 g Oral TID   Continuous Infusions: . sodium chloride 125 mL/hr at 09/01/20 1247  . sodium chloride Stopped (09/01/20 0317)     LOS: 1 day    Time spent: 35 minutes    Alberteen Sam, MD [facility] 09/01/2020, 6:35 PM     Please page though AMION or Epic secure chat:  For [employer], Higher education careers adviser

## 2020-09-01 NOTE — ED Notes (Signed)
Pt to MRI at this time.

## 2020-09-02 DIAGNOSIS — R400 Somnolence: Secondary | ICD-10-CM

## 2020-09-02 DIAGNOSIS — R7401 Elevation of levels of liver transaminase levels: Secondary | ICD-10-CM

## 2020-09-02 DIAGNOSIS — E860 Dehydration: Principal | ICD-10-CM

## 2020-09-02 LAB — CBC
HCT: 37.7 % — ABNORMAL LOW (ref 39.0–52.0)
Hemoglobin: 12.3 g/dL — ABNORMAL LOW (ref 13.0–17.0)
MCH: 29 pg (ref 26.0–34.0)
MCHC: 32.6 g/dL (ref 30.0–36.0)
MCV: 88.9 fL (ref 80.0–100.0)
Platelets: 121 10*3/uL — ABNORMAL LOW (ref 150–400)
RBC: 4.24 MIL/uL (ref 4.22–5.81)
RDW: 17.9 % — ABNORMAL HIGH (ref 11.5–15.5)
WBC: 6.1 10*3/uL (ref 4.0–10.5)
nRBC: 0 % (ref 0.0–0.2)

## 2020-09-02 LAB — BASIC METABOLIC PANEL
Anion gap: 8 (ref 5–15)
BUN: 18 mg/dL (ref 8–23)
CO2: 23 mmol/L (ref 22–32)
Calcium: 8.5 mg/dL — ABNORMAL LOW (ref 8.9–10.3)
Chloride: 107 mmol/L (ref 98–111)
Creatinine, Ser: 0.82 mg/dL (ref 0.61–1.24)
GFR, Estimated: >60 mL/min (ref >60)
Glucose, Bld: 92 mg/dL (ref 70–99)
Potassium: 3.1 mmol/L — ABNORMAL LOW (ref 3.5–5.1)
Sodium: 138 mmol/L (ref 135–145)

## 2020-09-02 LAB — HEPATIC FUNCTION PANEL
ALT: 115 U/L — ABNORMAL HIGH (ref 0–44)
AST: 159 U/L — ABNORMAL HIGH (ref 15–41)
Albumin: 2.9 g/dL — ABNORMAL LOW (ref 3.5–5.0)
Alkaline Phosphatase: 688 U/L — ABNORMAL HIGH (ref 38–126)
Bilirubin, Direct: 0.3 mg/dL — ABNORMAL HIGH (ref 0.0–0.2)
Indirect Bilirubin: 0.7 mg/dL (ref 0.3–0.9)
Total Bilirubin: 1 mg/dL (ref 0.3–1.2)
Total Protein: 6.5 g/dL (ref 6.5–8.1)

## 2020-09-02 LAB — CORTISOL
Cortisol, Plasma: 19.9 ug/dL
Cortisol, Plasma: 9.3 ug/dL

## 2020-09-02 MED ORDER — POTASSIUM CHLORIDE CRYS ER 20 MEQ PO TBCR
40.0000 meq | EXTENDED_RELEASE_TABLET | Freq: Two times a day (BID) | ORAL | Status: AC
  Administered 2020-09-02 – 2020-09-03 (×4): 40 meq via ORAL
  Filled 2020-09-02 (×4): qty 2

## 2020-09-02 NOTE — Progress Notes (Addendum)
PROGRESS NOTE    Charles Morgan  ZOX:[id number] DOB: 12-Jun-1930 DOA: 08/31/2020 PCP: Patient, No Pcp Per    Brief Narrative:  86 year old gentleman with history of vertebrobasilar insufficiency, recurrent syncope, history of a stroke and aortic stenosis.  History of DVT.  Autonomic dysfunction who was recently admitted with confusion presented back to the hospital with weakness, vomiting and confusion.  Patient recently moved from [place] to her granddaughter's care.  He was found on the floor.  After waking up in the hospital he also complained of right forearm and wrist weakness.  In the emergency room, hemodynamically stable.  Mild lactic acidosis.  Mild transaminitis.  CT scan abdomen pelvis with fatty liver.  CT head unremarkable.  Treated with IV fluids. 12/7, stroke alert called after patient noted weakness on the right arm when his mental status improved.  Acute stroke ruled out.  Probably peripheral neuropathy.   Assessment & Plan:   Active Problems:   Syncope   Dehydration   HLD (hyperlipidemia)   Transaminasemia   Thrombocytopenia (HCC)   Stroke (HCC)   Altered mental status   Lactic acidosis   Hypoalbuminemia   Hyperammonemia (HCC)  acute metabolic encephalopathy  cardiovascular insufficiency and recurrent syncope/dehydration with orthostatic moderate protein calorie malnutrition failure to thrive electrolyte abnormalities  Plan: Mental status improved. Acute stroke ruled out, negative CT head and MRI of the brain.  Right arm weakness is probably chronic or might have suffered neuropraxia after fall.  Seen by neurology.  Continue PT OT. Augment nutrition. Continue aspirin . Patient has transaminases more than 3 times normal since last few months.  Cause unknown.  Probably fatty liver.  Will discontinue statin.  Will need outpatient follow-up with repeat LFTs.  Social/ethics: Previous provider tried to reach to patient's daughter who is stated that she has no  relation and does not want to get called.  Apparently patient is living with her granddaughter and they live in a car. Unsafe to discharge. Social worker involved, may need to involve adult protective services. Continue to work with PT OT.  Anticipate discharge to a skilled nursing facility or may need long-term care.    DVT prophylaxis: enoxaparin (LOVENOX) injection 40 mg Start: 09/01/20 1000 SCDs Start: 08/31/20 2352   Code Status: DNR Family Communication: None, granddaughters phone not available. Disposition Plan: Status is: Inpatient  Remains inpatient appropriate because:Unsafe d/c plan   Dispo: The patient is from: Home              Anticipated d/c is to: SNF              Anticipated d/c date is: 1 day              Patient currently is medically stable to d/c.  But not safe to discharge home.         Consultants:   Neurology  Procedures:   None  Antimicrobials:   None   Subjective: Patient seen and examined.  No overnight events.  He denies any complaints.  He is mostly alert awake and oriented today.  He says he cannot use his right arm at all.  Patient states that he had some weakness of the 2nd and 3rd finger in the past, not like this. He fell asleep on the floor, does not remember whether he had trapped his shoulder.  Objective: Vitals:   09/01/20 2041 09/02/20 0010 09/02/20 0424 09/02/20 0832  BP: 137/77 139/75 138/78 (!) 148/75  Pulse: (!) 55 (!) 50 Marland Kitchen)  48 (!) 50  Resp: 18 20 20 18   Temp: 98.2 F (36.8 C) 97.9 F (36.6 C) 98.1 F (36.7 C) 97.8 F (36.6 C)  TempSrc: Oral Oral Oral Oral  SpO2: 100% 100% 99% 100%  Weight:      Height:        Intake/Output Summary (Last 24 hours) at 09/02/2020 1104 Last data filed at 09/02/2020 0500 Gross per 24 hour  Intake --  Output 350 ml  Net -350 ml   Filed Weights   08/31/20 1628  Weight: 63.5 kg    Examination:  General exam: Appears calm and comfortable  looks fairly comfortable on room  air.  Frail and debilitated looking gentleman.  Not in any distress. Respiratory system: Clear to auscultation. Respiratory effort normal. Cardiovascular system: S1 & S2 heard, RRR.  Gastrointestinal system: Soft.  Nontender. Central nervous system: Alert and oriented. Extremities: Symmetric 5 x 5 power all 3 extremities. Right wrist, fingers with power 3/5. No touch or pressure sensation. Skin: No rashes, lesions or ulcers Psychiatry: Judgement and insight appear normal. Mood & affect flat.    Data Reviewed: I have personally reviewed following labs and imaging studies  CBC: Recent Labs  Lab 08/31/20 1644 09/01/20 0533 09/02/20 0459  WBC 10.3 8.3 6.1  NEUTROABS 9.2*  --   --   HGB 12.8* 14.0 12.3*  HCT 39.2 43.5 37.7*  MCV 89.9 91.2 88.9  PLT 121* 111* 121*   Basic Metabolic Panel: Recent Labs  Lab 08/31/20 1644 09/01/20 0533 09/02/20 0459  NA 140 139 138  K 4.0 3.9 3.1*  CL 107 107 107  CO2 24 22 23   GLUCOSE 128* 110* 92  BUN 25* 19 18  CREATININE 1.22 0.98 0.82  CALCIUM 9.1 8.8* 8.5*  MG  --  2.1  --   PHOS  --  2.9  --    GFR: Estimated Creatinine Clearance: 55.9 mL/min (by C-G formula based on SCr of 0.82 mg/dL). Liver Function Tests: Recent Labs  Lab 08/31/20 1644 09/01/20 0533 09/02/20 0459  AST 130* 173* 159*  ALT 143* 141* 115*  ALKPHOS 869* 866* 688*  BILITOT 1.1 1.4* 1.0  PROT 7.5 7.4 6.5  ALBUMIN 3.3* 3.5 2.9*   No results for input(s): LIPASE, AMYLASE in the last 168 hours. Recent Labs  Lab 08/31/20 2301  AMMONIA 44*   Coagulation Profile: Recent Labs  Lab 08/31/20 1644 09/01/20 0533  INR 1.0 1.0   Cardiac Enzymes: No results for input(s): CKTOTAL, CKMB, CKMBINDEX, TROPONINI in the last 168 hours. BNP (last 3 results) No results for input(s): PROBNP in the last 8760 hours. HbA1C: No results for input(s): HGBA1C in the last 72 hours. CBG: Recent Labs  Lab 09/01/20 1150  GLUCAP 96   Lipid Profile: No results for  input(s): CHOL, HDL, LDLCALC, TRIG, CHOLHDL, LDLDIRECT in the last 72 hours. Thyroid Function Tests: No results for input(s): TSH, T4TOTAL, FREET4, T3FREE, THYROIDAB in the last 72 hours. Anemia Panel: No results for input(s): VITAMINB12, FOLATE, FERRITIN, TIBC, IRON, RETICCTPCT in the last 72 hours. Sepsis Labs: Recent Labs  Lab 08/31/20 2123 08/31/20 2301 09/01/20 0248 09/01/20 0533  LATICACIDVEN 2.3* 2.3* 1.5 1.9    Recent Results (from the past 240 hour(s))  Blood Cultures (routine x 2)     Status: None (Preliminary result)   Collection Time: 08/31/20  4:45 PM   Specimen: BLOOD RIGHT FOREARM  Result Value Ref Range Status   Specimen Description   Final    BLOOD  RIGHT FOREARM Performed at [facility], [address] [place], [place] [address]    Special Requests   Final    BOTTLES DRAWN AEROBIC AND ANAEROBIC Blood Culture adequate volume Performed at [facility], [address] [place], [place] [address]    Culture  Setup Time   Final    GRAM POSITIVE RODS AEROBIC BOTTLE ONLY Gram Stain Report Called to,Read Back By and Verified With: DOSS,MICHAEL RN @0802  09/01/20 BY JONES,T [facility] Performed at [facility], [address] [place], [place] [address]    Culture   Final    CULTURE REINCUBATED FOR BETTER GROWTH Performed at [facility], [address] [place], [place] [address]    Report Status PENDING  Incomplete  Resp Panel by RT-PCR (Flu A&B, Covid) Nasopharyngeal Swab     Status: None   Collection Time: 08/31/20  6:13 PM   Specimen: Nasopharyngeal Swab; Nasopharyngeal(NP) swabs in vial transport medium  Result Value Ref Range Status   SARS Coronavirus 2 by RT PCR NEGATIVE NEGATIVE Final    Comment: (NOTE) SARS-CoV-2 target nucleic acids are NOT DETECTED.  The SARS-CoV-2 RNA is generally detectable in upper respiratory specimens during the acute phase of infection. The lowest concentration of SARS-CoV-2 viral copies this assay can detect is 138 copies/mL.  A negative result does not preclude SARS-Cov-2 infection and should not be used as the sole basis for treatment or other patient management decisions. A negative result may occur with  improper specimen collection/handling, submission of specimen other than nasopharyngeal swab, presence of viral mutation(s) within the areas targeted by this assay, and inadequate number of viral copies(<138 copies/mL). A negative result must be combined with clinical observations, patient history, and epidemiological information. The expected result is Negative.  Fact Sheet for Patients:  BloggerCourse.comhttps://www.fda.gov/media/152166/download  Fact Sheet for Healthcare Providers:  SeriousBroker.ithttps://www.fda.gov/media/152162/download  This test is no t yet approved or cleared by the [place] FDA and  has been authorized for detection and/or diagnosis of SARS-CoV-2 by FDA under an Emergency Use Authorization (EUA). This EUA will remain  in effect (meaning this test can be used) for the duration of the COVID-19 declaration under Section 564(b)(1) of the Act, 21 U.S.C.section 360bbb-3(b)(1), unless the authorization is terminated  or revoked sooner.       Influenza A by PCR NEGATIVE NEGATIVE Final   Influenza B by PCR NEGATIVE NEGATIVE Final    Comment: (NOTE) The Xpert Xpress SARS-CoV-2/FLU/RSV plus assay is intended as an aid in the diagnosis of influenza from Nasopharyngeal swab specimens and should not be used as a sole basis for treatment. Nasal washings and aspirates are unacceptable for Xpert Xpress SARS-CoV-2/FLU/RSV testing.  Fact Sheet for Patients: BloggerCourse.comhttps://www.fda.gov/media/152166/download  Fact Sheet for Healthcare Providers: SeriousBroker.ithttps://www.fda.gov/media/152162/download  This test is not yet approved or cleared by the [place] FDA and has been authorized for detection and/or diagnosis of SARS-CoV-2 by FDA under an Emergency Use Authorization (EUA). This EUA will remain in effect (meaning this test  can be used) for the duration of the COVID-19 declaration under Section 564(b)(1) of the Act, 21 U.S.C. section 360bbb-3(b)(1), unless the authorization is terminated or revoked.  Performed at [facility], [address] [place], [place] [address]   Blood Cultures (routine x 2)     Status: None (Preliminary result)   Collection Time: 08/31/20  9:23 PM   Specimen: BLOOD RIGHT ARM  Result Value Ref Range Status   Specimen Description BLOOD RIGHT ARM  Final   Special Requests  Final    BOTTLES DRAWN AEROBIC AND ANAEROBIC Blood Culture adequate volume Performed at [facility], [address], [place], [place] [address]    Culture PENDING  Incomplete   Report Status PENDING  Incomplete         Radiology Studies: CT HEAD WO CONTRAST  Result Date: 08/31/2020 CLINICAL DATA:  Altered mental status. EXAM: CT HEAD WITHOUT CONTRAST TECHNIQUE: Contiguous axial images were obtained from the base of the skull through the vertex without intravenous contrast. COMPARISON:  June 04, 2020 FINDINGS: Brain: There is mild to moderate severity cerebral atrophy with widening of the extra-axial spaces and ventricular dilatation. There are areas of decreased attenuation within the white matter tracts of the supratentorial brain, consistent with microvascular disease changes. A stable area of cortical encephalomalacia, with adjacent chronic white matter low attenuation, are seen within the bilateral cerebellum. Vascular: No hyperdense vessel or unexpected calcification. Skull: Normal. Negative for fracture or focal lesion. Sinuses/Orbits: No acute finding. Other: None. IMPRESSION: 1. Generalized cerebral atrophy with chronic bilateral cerebellar infarcts. 2. No acute intracranial abnormality. Electronically Signed   By: Aram Candela M.D.   On: 08/31/2020 20:15   MR BRAIN WO CONTRAST  Result Date: 09/01/2020 CLINICAL DATA:  Right upper extremity weakness EXAM: MRI HEAD WITHOUT CONTRAST TECHNIQUE:  Multiplanar, multiecho pulse sequences of the brain and surrounding structures were obtained without intravenous contrast. COMPARISON:  06/05/2020 FINDINGS: Brain: There is no acute infarction or intracranial hemorrhage. There is no intracranial mass, mass effect, or edema. There is no hydrocephalus or extra-axial fluid collection. Ventricles and sulci are prominent reflecting generalized parenchymal volume loss. Patchy and confluent areas of T2 hyperintensity in the supratentorial white matter are nonspecific but probably reflect moderate chronic microvascular ischemic changes. There are chronic bilateral cerebellar and left thalamic infarcts. Vascular: Major vessel flow voids at the skull base are preserved. Skull and upper cervical spine: Normal marrow signal is preserved. Degenerative changes of the included cervical spine. Sinuses/Orbits: Minor mucosal thickening. Bilateral lens replacements. Other: Sella is unremarkable. Patchy right mastoid fluid opacification. IMPRESSION: No acute infarction, hemorrhage, or mass. Chronic microvascular ischemic changes and chronic infarcts as seen previously. Electronically Signed   By: Guadlupe Spanish M.D.   On: 09/01/2020 14:48   CT ABDOMEN PELVIS W CONTRAST  Result Date: 08/31/2020 CLINICAL DATA:  Abdominal pain and altered mental status. EXAM: CT ABDOMEN AND PELVIS WITH CONTRAST TECHNIQUE: Multidetector CT imaging of the abdomen and pelvis was performed using the standard protocol following bolus administration of intravenous contrast. CONTRAST:  OMNIPAQUE IOHEXOL 300 MG/ML  SOLN COMPARISON:  May 19, 2020 FINDINGS: Lower chest: There is mild to moderate severity cardiomegaly. Mild atelectasis is seen within the bilateral lung bases. Hepatobiliary: There is diffuse fatty infiltration of the liver parenchyma. No focal liver abnormality is seen. No gallstones or gallbladder wall thickening. Stable dilatation of the common bile duct is noted. Pancreas:  Unremarkable. No pancreatic ductal dilatation or surrounding inflammatory changes. Spleen: Normal in size without focal abnormality. Adrenals/Urinary Tract: Adrenal glands are unremarkable. Kidneys are normal in size, without renal calculi or hydronephrosis. Multiple subcentimeter cysts are seen within the right kidney. Bladder is unremarkable. Stomach/Bowel: There is a small hiatal hernia. The appendix is not clearly identified. No evidence of bowel wall dilatation. Noninflamed diverticula are seen throughout the large bowel. Vascular/Lymphatic: Aortic atherosclerosis. No enlarged abdominal or pelvic lymph nodes. Reproductive: The prostate gland is moderately enlarged. Other: No abdominal wall hernia or abnormality. No abdominopelvic ascites. Musculoskeletal: Multilevel degenerative changes seen  throughout the lumbar spine. IMPRESSION: 1. Mild to moderate severity cardiomegaly. 2. Fatty liver. 3. Small hiatal hernia. 4. Colonic diverticulosis. 5. Enlarged prostate gland. 6. Aortic atherosclerosis. Aortic Atherosclerosis (ICD10-I70.0). Electronically Signed   By: Aram Candela M.D.   On: 08/31/2020 20:20   DG Chest Port 1 View  Result Date: 08/31/2020 CLINICAL DATA:  86 year old male found down, head vomited. EXAM: PORTABLE CHEST 1 VIEW COMPARISON:  Chest radiographs 06/04/2020. FINDINGS: Portable AP upright view at 1654 hours. Stable implanted right chest wall cardiac device. Stable mild cardiomegaly. Other mediastinal contours are within normal limits. Visualized tracheal air column is within normal limits. Allowing for portable technique the lungs are clear. No pneumothorax or pleural effusion is evident. Increased bowel gas in the visible upper abdomen although no dilated loops are evident. No acute osseous abnormality identified. IMPRESSION: 1. No acute cardiopulmonary abnormality. 2. Increased gas in nondilated bowel in the visible upper abdomen. Electronically Signed   By: Odessa Fleming M.D.   On:  08/31/2020 17:18   CT HEAD CODE STROKE WO CONTRAST  Result Date: 09/01/2020 CLINICAL DATA:  Code stroke.  Right arm drift, numbness and tingling EXAM: CT HEAD WITHOUT CONTRAST CT ANGIOGRAPHY OF THE HEAD AND NECK TECHNIQUE: Contiguous axial images were obtained from the base of the skull through the vertex without intravenous contrast. Multidetector CT imaging of the head and neck was performed using the standard protocol during bolus administration of intravenous contrast. Multiplanar CT image reconstructions and MIPs were obtained to evaluate the vascular anatomy. Carotid stenosis measurements (when applicable) are obtained utilizing NASCET criteria, using the distal internal carotid diameter as the denominator. CONTRAST:  75mL OMNIPAQUE IOHEXOL 350 MG/ML SOLN COMPARISON:  CT head 08/31/2020 FINDINGS: CT HEAD Brain: There is no acute intracranial hemorrhage, mass effect, or edema. No new loss of gray-white differentiation. Age-indeterminate small vessel infarct of the left thalamus not definitely present on more remote studies. Chronic bilateral cerebellar infarcts are again seen. Patchy and confluent areas of hypoattenuation in the supratentorial white matter likely reflects stable chronic microvascular ischemic changes. There is no extra-axial fluid collection. Ventricles and sulci are stable in size and configuration. ASPECTS [facility] Stroke Program Early CT Score) - Ganglionic level infarction (caudate, lentiform nuclei, internal capsule, insula, M1-M3 cortex): 7 - Supraganglionic infarction (M4-M6 cortex): 3 Total score (0-10 with 10 being normal): 10 Vascular: No hyperdense vessel.There is atherosclerotic calcification at the skull base. Skull: Calvarium is unremarkable. Sinuses/Orbits: No acute finding. Other: None. Review of the MIP images confirms the above findings CTA NECK Aortic arch: Great vessel origins are patent. Right carotid system: Patent. Trace calcified plaque at the ICA origin. No  measurable stenosis. Left carotid system: Patent. No measurable stenosis at the ICA origin. Vertebral arteries: Patent. Skeleton: Advanced degenerative changes of the cervical spine. Other neck: No mass or adenopathy. Upper chest: No apical lung mass. Review of the MIP images confirms the above findings CTA HEAD Anterior circulation: Intracranial internal carotid arteries are patent with calcified plaque but no significant stenosis. Anterior and middle cerebral arteries are patent. Posterior circulation: Intracranial vertebral arteries are patent. Patent PICA origins. Right vertebral artery becomes diminutive after PICA origin. Basilar artery is patent. There is focal marked stenosis at the level of the mid basilar. Small caliber distal basilar due to fetal or near fetal origin of both posterior cerebral arteries. Moderate stenosis of the proximal right P2 PCA. Mild, probably atherosclerotic irregularity of the left posterior communicating artery. Venous sinuses: Patent as allowed by contrast bolus timing.  Review of the MIP images confirms the above findings IMPRESSION: No acute intracranial hemorrhage or evidence of acute infarction. 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 No large vessel occlusion or hemodynamically significant stenosis in the neck. No proximal intracranial vessel occlusion. Short segment marked stenosis of the mid basilar. Moderate stenosis proximal right P2 PCA. These results were communicated to Dr. Derry Lory At 12:24 pm on 09/01/2020 by text page via the [facility] messaging system. Electronically Signed   By: Guadlupe Spanish M.D.   On: 09/01/2020 12:54   CT ANGIO HEAD CODE STROKE  Result Date: 09/01/2020 CLINICAL DATA:  Code stroke.  Right arm drift, numbness and tingling EXAM: CT HEAD WITHOUT CONTRAST CT ANGIOGRAPHY OF THE HEAD AND NECK TECHNIQUE: Contiguous axial images were obtained from the base of the skull through the vertex without intravenous contrast. Multidetector CT  imaging of the head and neck was performed using the standard protocol during bolus administration of intravenous contrast. Multiplanar CT image reconstructions and MIPs were obtained to evaluate the vascular anatomy. Carotid stenosis measurements (when applicable) are obtained utilizing NASCET criteria, using the distal internal carotid diameter as the denominator. CONTRAST:  65mL OMNIPAQUE IOHEXOL 350 MG/ML SOLN COMPARISON:  CT head 08/31/2020 FINDINGS: CT HEAD Brain: There is no acute intracranial hemorrhage, mass effect, or edema. No new loss of gray-white differentiation. Age-indeterminate small vessel infarct of the left thalamus not definitely present on more remote studies. Chronic bilateral cerebellar infarcts are again seen. Patchy and confluent areas of hypoattenuation in the supratentorial white matter likely reflects stable chronic microvascular ischemic changes. There is no extra-axial fluid collection. Ventricles and sulci are stable in size and configuration. ASPECTS [facility] Stroke Program Early CT Score) - Ganglionic level infarction (caudate, lentiform nuclei, internal capsule, insula, M1-M3 cortex): 7 - Supraganglionic infarction (M4-M6 cortex): 3 Total score (0-10 with 10 being normal): 10 Vascular: No hyperdense vessel.There is atherosclerotic calcification at the skull base. Skull: Calvarium is unremarkable. Sinuses/Orbits: No acute finding. Other: None. Review of the MIP images confirms the above findings CTA NECK Aortic arch: Great vessel origins are patent. Right carotid system: Patent. Trace calcified plaque at the ICA origin. No measurable stenosis. Left carotid system: Patent. No measurable stenosis at the ICA origin. Vertebral arteries: Patent. Skeleton: Advanced degenerative changes of the cervical spine. Other neck: No mass or adenopathy. Upper chest: No apical lung mass. Review of the MIP images confirms the above findings CTA HEAD Anterior circulation: Intracranial internal carotid  arteries are patent with calcified plaque but no significant stenosis. Anterior and middle cerebral arteries are patent. Posterior circulation: Intracranial vertebral arteries are patent. Patent PICA origins. Right vertebral artery becomes diminutive after PICA origin. Basilar artery is patent. There is focal marked stenosis at the level of the mid basilar. Small caliber distal basilar due to fetal or near fetal origin of both posterior cerebral arteries. Moderate stenosis of the proximal right P2 PCA. Mild, probably atherosclerotic irregularity of the left posterior communicating artery. Venous sinuses: Patent as allowed by contrast bolus timing. Review of the MIP images confirms the above findings IMPRESSION: No acute intracranial hemorrhage or evidence of acute infarction. 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 No large vessel occlusion or hemodynamically significant stenosis in the neck. No proximal intracranial vessel occlusion. Short segment marked stenosis of the mid basilar. Moderate stenosis proximal right P2 PCA. These results were communicated to Dr. Derry Lory At 12:24 pm on 09/01/2020 by text page via the [facility] messaging system. Electronically Signed   By: Jackquline Berlin.D.  On: 09/01/2020 12:54   CT ANGIO NECK CODE STROKE  Result Date: 09/01/2020 CLINICAL DATA:  Code stroke.  Right arm drift, numbness and tingling EXAM: CT HEAD WITHOUT CONTRAST CT ANGIOGRAPHY OF THE HEAD AND NECK TECHNIQUE: Contiguous axial images were obtained from the base of the skull through the vertex without intravenous contrast. Multidetector CT imaging of the head and neck was performed using the standard protocol during bolus administration of intravenous contrast. Multiplanar CT image reconstructions and MIPs were obtained to evaluate the vascular anatomy. Carotid stenosis measurements (when applicable) are obtained utilizing NASCET criteria, using the distal internal carotid diameter as the  denominator. CONTRAST:  75mL OMNIPAQUE IOHEXOL 350 MG/ML SOLN COMPARISON:  CT head 08/31/2020 FINDINGS: CT HEAD Brain: There is no acute intracranial hemorrhage, mass effect, or edema. No new loss of gray-white differentiation. Age-indeterminate small vessel infarct of the left thalamus not definitely present on more remote studies. Chronic bilateral cerebellar infarcts are again seen. Patchy and confluent areas of hypoattenuation in the supratentorial white matter likely reflects stable chronic microvascular ischemic changes. There is no extra-axial fluid collection. Ventricles and sulci are stable in size and configuration. ASPECTS [facility] Stroke Program Early CT Score) - Ganglionic level infarction (caudate, lentiform nuclei, internal capsule, insula, M1-M3 cortex): 7 - Supraganglionic infarction (M4-M6 cortex): 3 Total score (0-10 with 10 being normal): 10 Vascular: No hyperdense vessel.There is atherosclerotic calcification at the skull base. Skull: Calvarium is unremarkable. Sinuses/Orbits: No acute finding. Other: None. Review of the MIP images confirms the above findings CTA NECK Aortic arch: Great vessel origins are patent. Right carotid system: Patent. Trace calcified plaque at the ICA origin. No measurable stenosis. Left carotid system: Patent. No measurable stenosis at the ICA origin. Vertebral arteries: Patent. Skeleton: Advanced degenerative changes of the cervical spine. Other neck: No mass or adenopathy. Upper chest: No apical lung mass. Review of the MIP images confirms the above findings CTA HEAD Anterior circulation: Intracranial internal carotid arteries are patent with calcified plaque but no significant stenosis. Anterior and middle cerebral arteries are patent. Posterior circulation: Intracranial vertebral arteries are patent. Patent PICA origins. Right vertebral artery becomes diminutive after PICA origin. Basilar artery is patent. There is focal marked stenosis at the level of the mid  basilar. Small caliber distal basilar due to fetal or near fetal origin of both posterior cerebral arteries. Moderate stenosis of the proximal right P2 PCA. Mild, probably atherosclerotic irregularity of the left posterior communicating artery. Venous sinuses: Patent as allowed by contrast bolus timing. Review of the MIP images confirms the above findings IMPRESSION: No acute intracranial hemorrhage or evidence of acute infarction. 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 No large vessel occlusion or hemodynamically significant stenosis in the neck. No proximal intracranial vessel occlusion. Short segment marked stenosis of the mid basilar. Moderate stenosis proximal right P2 PCA. These results were communicated to Dr. Derry Lory At 12:24 pm on 09/01/2020 by text page via the [facility] messaging system. Electronically Signed   By: Guadlupe Spanish M.D.   On: 09/01/2020 12:54   US Abdomen Limited RUQ (LIVER/GB)  Result Date: 09/01/2020 CLINICAL DATA:  Elevated liver function tests. EXAM: ULTRASOUND ABDOMEN LIMITED RIGHT UPPER QUADRANT COMPARISON:  August 31, 2020. FINDINGS: Gallbladder: No gallstones or wall thickening visualized. No sonographic Murphy sign noted by sonographer. Common bile duct: Diameter: 12 mm proximally which is dilated. Liver: No focal lesion identified. Within normal limits in parenchymal echogenicity. Portal vein is patent on color Doppler imaging with normal direction of blood flow towards the  liver. Other: None. IMPRESSION: Dilated common bile duct is noted. Correlation with liver function tests is recommended to evaluate for possible distal common bile duct obstruction. No other abnormality seen in the right upper quadrant of the abdomen. Electronically Signed   By: Lupita Raider M.D.   On: 09/01/2020 09:20        Scheduled Meds: . aspirin EC  81 mg Oral Daily  . atorvastatin  80 mg Oral Daily  . enoxaparin (LOVENOX) injection  40 mg Subcutaneous Q24H  . lactulose   20 g Oral TID  . potassium chloride  40 mEq Oral BID   Continuous Infusions: . sodium chloride 125 mL/hr at 09/01/20 1247     LOS: 2 days    Time spent: 35 minutes    Dorcas Carrow, MD [facility] Pager [phone]

## 2020-09-02 NOTE — Plan of Care (Signed)
  Problem: Acute Rehab OT Goals (only OT should resolve) Goal: Pt. Will Perform Upper Body Dressing Flowsheets (Taken 09/02/2020 1007) Pt Will Perform Upper Body Dressing:  with supervision  sitting Goal: Pt. Will Perform Lower Body Dressing Flowsheets (Taken 09/02/2020 1007) Pt Will Perform Lower Body Dressing:  with supervision  sitting/lateral leans  sit to/from stand Goal: Pt. Will Transfer To Toilet Flowsheets (Taken 09/02/2020 1007) Pt Will Transfer to Toilet:  with modified independence  ambulating  regular height toilet Goal: Pt. Will Perform Toileting-Clothing Manipulation Flowsheets (Taken 09/02/2020 1007) Pt Will Perform Toileting - Clothing Manipulation and hygiene:  with modified independence  sitting/lateral leans  sit to/from stand Goal: Pt/Caregiver Will Perform Home Exercise Program Flowsheets (Taken 09/02/2020 1007) Pt/caregiver will Perform Home Exercise Program:  Increased strength  Right Upper extremity  Independently  With written HEP provided

## 2020-09-02 NOTE — Evaluation (Signed)
Physical Therapy Evaluation Patient Details Name: Charles Morgan MRN: [id number] DOB: 20-May-1930 Today's Date: 09/02/2020   History of Present Illness  Charles Morgan is a 86 y.o. male with medical history significant for vertebrobasilar insufficiency, multiple episodes of syncope, h/o CVAs, autonomic dysfunction, right lower extremity DVT and hyperlipidemia who presents to the emergency department via EMS due to altered mental status.  Patient states that he slept on the floor yesterday (though, he does not know why) and that he remembered vomiting, but he was too weak to get up.  This morning, daughter found him slumped over on the floor with noted vomit around him, he did complain of abdominal pain at that time and arouses to voice. Code stroke called due to pt RUE ataxia, MRI and CT negative for acute infarct.     Clinical Impression  Patient unable to grip with right hand, had to use SPC, patient very unsteady on feet, at high risk for falls, frequent drifting left/right with fair/poor carryover for using cane due to carrying rather than using for support when taking steps.  Patient tolerated sitting up in chair after therapy.  Patient will benefit from continued physical therapy in hospital and recommended venue below to increase strength, balance, endurance for safe ADLs and gait.     Follow Up Recommendations SNF;Supervision for mobility/OOB;Supervision - Intermittent    Equipment Recommendations  Cane    Recommendations for Other Services       Precautions / Restrictions Precautions Precautions: Fall Restrictions Weight Bearing Restrictions: No      Mobility  Bed Mobility Overal bed mobility: Modified Independent             General bed mobility comments: increased time     Transfers Overall transfer level: Needs assistance Equipment used: None;Straight cane Transfers: Sit to/from Stand;Stand Pivot Transfers Sit to Stand: Min assist;Mod assist Stand pivot transfers:  Min assist       General transfer comment: required repeated attempts to complete sit to stands, very unsteady labored movement  Ambulation/Gait Ambulation/Gait assistance: Min assist;Mod assist Gait Distance (Feet): 50 Feet Assistive device: Straight cane Gait Pattern/deviations: Decreased step length - right;Decreased step length - left;Decreased stride length;Narrow base of support;Ataxic;Drifts right/left;Step-to pattern Gait velocity: decreased   General Gait Details: demonstrates slow labored unsteady cadence with frequent drifting left/right, poor carryover for using SPC, frequently holding off floor instead of using for support, limited secondary to fatigue, unable to grip with right hand  Stairs            Wheelchair Mobility    Modified Rankin (Stroke Patients Only)       Balance Overall balance assessment: Needs assistance Sitting-balance support: Feet supported;No upper extremity supported Sitting balance-Leahy Scale: Good Sitting balance - Comments: seated at EOB   Standing balance support: During functional activity;Single extremity supported Standing balance-Leahy Scale: Poor Standing balance comment: fair/poor using SPC                             Pertinent Vitals/Pain Pain Assessment: No/denies pain    Home Living Family/patient expects to be discharged to:: Private residence Living Arrangements: Other relatives Available Help at Discharge: Family;Available PRN/intermittently Type of Home: House Home Access: Stairs to enter Entrance Stairs-Rails: None Entrance Stairs-Number of Steps: 4 Home Layout: Two level Home Equipment: Walker - 2 wheels;Shower seat;Wheelchair - manual      Prior Function Level of Independence: Needs assistance   Gait / Transfers Assistance Needed: household  and short distanced community ambulator without AD  ADL's / Homemaking Assistance Needed: granddaughter assists with housekeeping and meal preparation,  independent in ADLs        Hand Dominance   Dominant Hand: Right    Extremity/Trunk Assessment   Upper Extremity Assessment Upper Extremity Assessment: Defer to OT evaluation    Lower Extremity Assessment Lower Extremity Assessment: Generalized weakness    Cervical / Trunk Assessment Cervical / Trunk Assessment: Normal  Communication   Communication: No difficulties  Cognition Arousal/Alertness: Awake/alert Behavior During Therapy: WFL for tasks assessed/performed Overall Cognitive Status: Within Functional Limits for tasks assessed                                        General Comments      Exercises     Assessment/Plan    PT Assessment Patient needs continued PT services  PT Problem List Decreased strength;Decreased activity tolerance;Decreased balance;Decreased mobility       PT Treatment Interventions Balance training;Gait training;Stair training;Functional mobility training;Therapeutic activities;Therapeutic exercise;DME instruction;Patient/family education    PT Goals (Current goals can be found in the Care Plan section)  Acute Rehab PT Goals Patient Stated Goal: return home after rehab PT Goal Formulation: With patient Time For Goal Achievement: 09/16/20 Potential to Achieve Goals: Good    Frequency Min 3X/week   Barriers to discharge        Co-evaluation               AM-PAC PT "6 Clicks" Mobility  Outcome Measure Help needed turning from your back to your side while in a flat bed without using bedrails?: None Help needed moving from lying on your back to sitting on the side of a flat bed without using bedrails?: A Little Help needed moving to and from a bed to a chair (including a wheelchair)?: A Little Help needed standing up from a chair using your arms (e.g., wheelchair or bedside chair)?: A Lot Help needed to walk in hospital room?: A Lot Help needed climbing 3-5 steps with a railing? : A Lot 6 Click Score: 16     End of Session Equipment Utilized During Treatment: Gait belt Activity Tolerance: Patient tolerated treatment well;Patient limited by fatigue Patient left: in chair;with call bell/phone within reach Nurse Communication: Mobility status PT Visit Diagnosis: Unsteadiness on feet (R26.81);Muscle weakness (generalized) (M62.81);Other abnormalities of gait and mobility (R26.89);History of falling (Z91.81)    Time: 6812-7517 PT Time Calculation (min) (ACUTE ONLY): 30 min   Charges:   PT Evaluation $PT Eval Moderate Complexity: 1 Mod PT Treatments $Therapeutic Activity: 23-37 mins        2:50 PM, 09/02/20 Ocie Bob, MPT Physical Therapist with [facility] [phone] office [phone] mobile phone

## 2020-09-02 NOTE — TOC Initial Note (Signed)
Transition of Care [facility]) - Initial/Assessment Note   Patient Details  Name: Charles Morgan MRN: [id number] Date of Birth: August 31, 1930  Transition [facility]) CM/SW Contact:    Ewing Schlein, LCSW Phone Number: 09/02/2020, 11:30 AM  Clinical Narrative: Patient is an 86 year old male who was admitted for altered mental status. Patient has a history of syncope, HLD, transaminasemia, thrombocytopenia, and stroke. PT evaluation recommends SNF. CSW spoke with patient and patient is agreeable to SNF.  FL2 completed and PASRR received. Initial referral faxed out in hub. TOC awaiting bed offers; facility will need to start insurance authorization.  Expected Discharge Plan: Skilled Nursing Facility Barriers to Discharge: Continued Medical Work up  Patient Goals and CMS Choice Patient states their goals for this hospitalization and ongoing recovery are:: Go to rehab CMS Medicare.gov Compare Post Acute Care list provided to:: Patient Choice offered to / list presented to : Patient  Expected Discharge Plan and Services Expected Discharge Plan: Skilled Nursing Facility In-house Referral: Clinical Social Work Discharge Planning Services: NA Post Acute Care Choice: Skilled Nursing Facility Living arrangements for the past 2 months: Apartment             DME Arranged: N/A DME Agency: NA HH Arranged: NA HH Agency: NA  Prior Living Arrangements/Services Living arrangements for the past 2 months: Apartment Lives with:: Adult Children Patient language and need for interpreter reviewed:: Yes Do you feel safe going back to the place where you live?: Yes      Need for Family Participation in Patient Care: No (Comment) Care giver support system in place?: Yes (comment) Current home services: DME [facility]) Criminal Activity/Legal Involvement Pertinent to Current Situation/Hospitalization: No - Comment as needed  Activities of Daily Living Home Assistive Devices/Equipment: None ADL Screening  (condition at time of admission) Patient's cognitive ability adequate to safely complete daily activities?: Yes Is the patient deaf or have difficulty hearing?: Yes Does the patient have difficulty seeing, even when wearing glasses/contacts?: No Does the patient have difficulty concentrating, remembering, or making decisions?: Yes Patient able to express need for assistance with ADLs?: Yes Does the patient have difficulty dressing or bathing?: No Independently performs ADLs?: Yes (appropriate for developmental age) Does the patient have difficulty walking or climbing stairs?: Yes Weakness of Legs: Both Weakness of Arms/Hands: None  Permission Sought/Granted Permission sought to share information with : Facility Industrial/product designer granted to share information with : Yes, Verbal Permission Granted Permission granted to share info w AGENCY: SNFs  Emotional Assessment Appearance:: Appears stated age Attitude/Demeanor/Rapport: Engaged Affect (typically observed): Accepting Orientation: : Oriented to Self, Oriented to Place, Oriented to  Time, Oriented to Situation Alcohol / Substance Use: Not Applicable Psych Involvement: No (comment)  Admission diagnosis:  Delirium [R41.0] Altered mental status [R41.82] Elevated liver enzymes [R74.8] Patient Active Problem List   Diagnosis Date Noted  . Lactic acidosis 09/01/2020  . Hypoalbuminemia 09/01/2020  . Hyperammonemia (HCC) 09/01/2020  . Altered mental status 08/31/2020  . CKD (chronic kidney disease) stage 3, GFR 30-59 ml/min (HCC) 06/05/2020  . Basilar artery stenosis 06/05/2020  . Transaminasemia 06/05/2020  . Thrombocytopenia (HCC) 06/05/2020  . Syncope 06/04/2020  . Dehydration 06/04/2020  . AKI (acute kidney injury) (HCC) 06/04/2020  . HLD (hyperlipidemia) 06/04/2020  . Calcification of aortic valve 06/04/2020  . Syncope and collapse 06/04/2020  . Ventricular tachycardia (HCC) 02/18/2020  . Adrenal insufficiency  (Addison's disease) (HCC) 01/26/2020  . Ambulatory dysfunction 01/26/2020  . Severe muscle deconditioning 01/12/2020  .  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 Elevated AST (SGOT) 01/11/2020  . History of stroke 01/10/2020  . Epigastric pain 12/06/2018  . Stroke (HCC) 08/31/2017  . Near syncope 11/04/2015   PCP:  Patient, No Pcp Per Pharmacy:   [id number] - [place], [place] - [address] [place] [address] Phone: [phone] Fax: [phone]  Readmission Risk Interventions No flowsheet data found.

## 2020-09-02 NOTE — Plan of Care (Signed)
  Problem: Acute Rehab PT Goals(only PT should resolve) Goal: Pt Will Go Supine/Side To Sit Outcome: Progressing Flowsheets (Taken 09/02/2020 1452) Pt will go Supine/Side to Sit:  Independently  with modified independence Goal: Patient Will Transfer Sit To/From Stand Outcome: Progressing Flowsheets (Taken 09/02/2020 1452) Patient will transfer sit to/from stand: with min guard assist Goal: Pt Will Transfer Bed To Chair/Chair To Bed Outcome: Progressing Flowsheets (Taken 09/02/2020 1452) Pt will Transfer Bed to Chair/Chair to Bed: min guard assist Goal: Pt Will Ambulate Outcome: Progressing Flowsheets (Taken 09/02/2020 1452) Pt will Ambulate:  100 feet  with min guard assist  with minimal assist  with cane   2:52 PM, 09/02/20 Ocie Bob, MPT Physical Therapist with [facility] [phone] office [phone] mobile phone

## 2020-09-02 NOTE — NC FL2 (Signed)
[place] MEDICAID FL2 LEVEL OF CARE SCREENING TOOL     IDENTIFICATION  Patient Name: Charles Morgan Birthdate: 1930-09-24 Sex: male Admission Date (Current Location): 08/31/2020  [facility] [place] Number:  [employer] and Address:  [facility],  [address] [place] [address]      Provider Number: [id number]  Attending Physician Name and Address:  Dorcas Carrow, MD  Relative Name and Phone Number:  Durwood Dittus (daughter) Ph: [phone]    Current Level of Care: Hospital Recommended Level of Care: Skilled Nursing Facility Prior Approval Number:    Date Approved/Denied:   PASRR Number: [id number]  Discharge Plan: SNF    Current Diagnoses: Patient Active Problem List   Diagnosis Date Noted  . Lactic acidosis 09/01/2020  . Hypoalbuminemia 09/01/2020  . Hyperammonemia (HCC) 09/01/2020  . Altered mental status 08/31/2020  . CKD (chronic kidney disease) stage 3, GFR 30-59 ml/min (HCC) 06/05/2020  . Basilar artery stenosis 06/05/2020  . Transaminasemia 06/05/2020  . Thrombocytopenia (HCC) 06/05/2020  . Syncope 06/04/2020  . Dehydration 06/04/2020  . AKI (acute kidney injury) (HCC) 06/04/2020  . HLD (hyperlipidemia) 06/04/2020  . Calcification of aortic valve 06/04/2020  . Syncope and collapse 06/04/2020  . Ventricular tachycardia (HCC) 02/18/2020  . Adrenal insufficiency (Addison's disease) (HCC) 01/26/2020  . Ambulatory dysfunction 01/26/2020  . Severe muscle deconditioning 01/12/2020  . 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 Elevated AST (SGOT) 01/11/2020  . History of stroke 01/10/2020  . Epigastric pain 12/06/2018  . Stroke (HCC) 08/31/2017  . Near syncope 11/04/2015    Orientation RESPIRATION BLADDER Height & Weight     Self, Time, Situation, Place  Normal Continent Weight: 140 lb (63.5 kg) Height:  5\' 6"  (167.6 cm)  BEHAVIORAL SYMPTOMS/MOOD NEUROLOGICAL BOWEL NUTRITION STATUS      Continent Diet (Heart healthy)   AMBULATORY STATUS COMMUNICATION OF NEEDS Skin   Limited Assist Verbally Normal                       Personal Care Assistance Level of Assistance  Bathing, Feeding, Dressing Bathing Assistance: Limited assistance Feeding assistance: Independent Dressing Assistance: Limited assistance     Functional Limitations Info  Sight, Hearing, Speech Sight Info: Adequate Hearing Info: Adequate Speech Info: Adequate    SPECIAL CARE FACTORS FREQUENCY  PT (By licensed PT)     PT Frequency: 5x's/week              Contractures Contractures Info: Not present    Additional Factors Info  Code Status, Allergies Code Status Info: DNR Allergies Info: Tuberculin           Current Medications (09/02/2020):  This is the current hospital active medication list Current Facility-Administered Medications  Medication Dose Route Frequency Provider Last Rate Last Admin  . 0.9 %  sodium chloride infusion   Intravenous Continuous Adefeso, Oladapo, DO 125 mL/hr at 09/01/20 1247 Rate Verify at 09/01/20 1247  . aspirin EC tablet 81 mg  81 mg Oral Daily Adefeso, Oladapo, DO   81 mg at 09/02/20 1112  . enoxaparin (LOVENOX) injection 40 mg  40 mg Subcutaneous Q24H Adefeso, Oladapo, DO   40 mg at 09/01/20 1141  . lactulose (CHRONULAC) 10 GM/15ML solution 20 g  20 g Oral TID Adefeso, Oladapo, DO   20 g at 09/02/20 1114  . potassium chloride SA (KLOR-CON) CR tablet 40 mEq  40 mEq Oral BID 14/08/21, MD   40 mEq at 09/02/20 1113  Discharge Medications: Please see discharge summary for a list of discharge medications.  Relevant Imaging Results:  Relevant Lab Results:   Additional Information SSN: [id number]  Ewing Schlein, LCSW

## 2020-09-02 NOTE — Evaluation (Addendum)
Occupational Therapy Evaluation Patient Details Name: Charles Morgan MRN: [id number] DOB: 1930-01-03 Today's Date: 09/02/2020    History of Present Illness Charles Morgan is a 86 y.o. male with medical history significant for vertebrobasilar insufficiency, multiple episodes of syncope, h/o CVAs, autonomic dysfunction, right lower extremity DVT and hyperlipidemia who presents to the emergency department via EMS due to altered mental status.  Patient states that he slept on the floor yesterday (though, he does not know why) and that he remembered vomiting, but he was too weak to get up.  This morning, daughter found him slumped over on the floor with noted vomit around him, he did complain of abdominal pain at that time and arouses to voice. Code stroke called due to pt RUE ataxia, MRI and CT negative for acute infarct.    Clinical Impression   Pt agreeable to OT evaluation this am. Pt with negative MRI and CT, question nerve palsy impacting RUE functioning. Pt good shoulder strength, elbow/forearm/wrist/digits with little to no active motion, P/ROM is WNL. Pt reports his granddaughter can assist him at home with ADLs as needed. Recommend SNF on discharge to address RUE functioning and improve safety and independence in ADL completion.     Follow Up Recommendations  SNF   Equipment Recommendations  None recommended by OT       Precautions / Restrictions Precautions Precautions: Fall Restrictions Weight Bearing Restrictions: No      Mobility Bed Mobility Overal bed mobility: Modified Independent             General bed mobility comments: increased time     Transfers                 General transfer comment: Defer to PT note        ADL either performed or assessed with clinical judgement   ADL Overall ADL's : Needs assistance/impaired Eating/Feeding: Set up;Bed level Eating/Feeding Details (indicate cue type and reason): pt unable to use RUE to assist, requires set-up  and increased time for using non-dominant left hand to self-feed Grooming: Set up;Sitting Grooming Details (indicate cue type and reason): pt unable to use RUE to assist, requires set-up and increased time for using non-dominant left hand to perform tasks         Upper Body Dressing : Moderate assistance;Sitting Upper Body Dressing Details (indicate cue type and reason): unable to use RUE for assistance  Lower Body Dressing: Moderate assistance;Sitting/lateral leans Lower Body Dressing Details (indicate cue type and reason): unable to use RUE for assistance                      Vision Baseline Vision/History: No visual deficits Patient Visual Report: No change from baseline Vision Assessment?: No apparent visual deficits            Pertinent Vitals/Pain Pain Assessment: No/denies pain     Hand Dominance Right   Extremity/Trunk Assessment Upper Extremity Assessment Upper Extremity Assessment: RUE deficits/detail RUE Deficits / Details: shoulder strength 4/5, elbow flexion 2/5, elbow extension/wrist flexion & extension, hand 0/5 RUE Coordination: decreased fine motor;decreased gross motor   Lower Extremity Assessment Lower Extremity Assessment: Defer to PT evaluation   Cervical / Trunk Assessment Cervical / Trunk Assessment: Normal   Communication     Cognition Arousal/Alertness: Awake/alert Behavior During Therapy: WFL for tasks assessed/performed Overall Cognitive Status: Within Functional Limits for tasks assessed  Exercises Exercises: Other exercises Other Exercises Other Exercises: RUE weightbearing-seated on forearm and on palm, 1' each   Shoulder Instructions      Home Living Family/patient expects to be discharged to:: Private residence Living Arrangements: Other relatives (granddaughter and her husband) Available Help at Discharge: Family;Available PRN/intermittently Type of Home: House Home  Access: Stairs to enter [employer] of Steps: 4 Entrance Stairs-Rails: None Home Layout: Two level Alternate Level Stairs-Number of Steps: 11   Bathroom Shower/Tub: Producer, television/film/video: Standard     Home Equipment: Environmental consultant - 2 wheels;Shower seat;Wheelchair - manual          Prior Functioning/Environment Level of Independence: Needs assistance  Gait / Transfers Assistance Needed: No DME used for mobility ADL's / Homemaking Assistance Needed: granddaughter assists with housekeeping and meal preparation, independent in ADLs            OT Problem List: Decreased strength;Decreased activity tolerance;Decreased coordination;Decreased safety awareness;Decreased knowledge of use of DME or AE;Impaired UE functional use      OT Treatment/Interventions: Self-care/ADL training;Therapeutic exercise;Neuromuscular education;DME and/or AE instruction;Splinting;Therapeutic activities;Patient/family education    OT Goals(Current goals can be found in the care plan section) Acute Rehab OT Goals Patient Stated Goal: to use my right arm OT Goal Formulation: With patient Time For Goal Achievement: 09/16/20 Potential to Achieve Goals: Good  OT Frequency: Min 1X/week                 End of Session    Activity Tolerance: Patient tolerated treatment well Patient left: in bed;with call bell/phone within reach;with bed alarm set  OT Visit Diagnosis: Muscle weakness (generalized) (M62.81);Other symptoms and signs involving the nervous system (R15.400)                Time: 8676-1950 OT Time Calculation (min): 18 min Charges:  OT General Charges $OT Visit: 1 Visit OT Evaluation $OT Eval Low Complexity: 1 Low   Ezra Sites, OTR/L  [phone] 09/02/2020, 10:01 AM
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
## 2020-09-03 NOTE — Progress Notes (Signed)
PROGRESS NOTE    Charles Morgan  LFY:[id number] DOB: 24-Mar-1930 DOA: 08/31/2020 PCP: Patient, No Pcp Per    Brief Narrative:  86 year old gentleman with history of vertebrobasilar insufficiency, recurrent syncope, history of a stroke and aortic stenosis.  History of DVT.  Autonomic dysfunction who was recently admitted with confusion presented back to the hospital with weakness, vomiting and confusion. He was found on the floor.  After waking up in the hospital he also complained of right forearm and wrist weakness.  In the emergency room, hemodynamically stable.  Mild lactic acidosis.  Mild transaminitis.  CT scan abdomen pelvis with fatty liver.  CT head unremarkable.  Treated with IV fluids. 12/7, stroke alert called after patient noted weakness on the right arm when his mental status improved.  Acute stroke ruled out.  Probably peripheral neuropathy.   Assessment & Plan:   Active Problems:   Syncope   Dehydration   HLD (hyperlipidemia)   Transaminasemia   Thrombocytopenia (HCC)   Stroke (HCC)   Altered mental status   Lactic acidosis   Hypoalbuminemia   Hyperammonemia (HCC)  acute metabolic encephalopathy  cardiovascular insufficiency and recurrent syncope/dehydration with orthostatic moderate protein calorie malnutrition failure to thrive electrolyte abnormalities  Plan: Mental status improved. Acute stroke ruled out, negative CT head and MRI of the brain.  Patient does have history of carpal tunnel syndrome and chronic weakness of the thumb and index finger and ring finger.  Right wrist weakness is probably chronic or might have suffered neuropraxia after fall.  Seen by neurology.  Continue PT OT. Augment nutrition. Continue aspirin . Patient has transaminases more than 3 times normal since last few months.  Cause unknown.  Probably fatty liver.  Will discontinue statin.  Will need outpatient follow-up with repeat LFTs.  Social/ethics: Previous provider tried to reach to  patient's daughter who stated that she has no relation and does not want to get called.  Patient tells me that he lives alone.  He previously was living with her daughter and does not want to go back. Social worker involved, may need to involve adult protective services. Continue to work with PT OT.  Anticipate discharge to a skilled nursing facility.    DVT prophylaxis: enoxaparin (LOVENOX) injection 40 mg Start: 09/01/20 1000 SCDs Start: 08/31/20 2352   Code Status: DNR Family Communication: None, social worker trying to reach family. Disposition Plan: Status is: Inpatient  Remains inpatient appropriate because:Unsafe d/c plan   Dispo: The patient is from: Home              Anticipated d/c is to: SNF              Anticipated d/c date is: 1 day              Patient currently is medically stable to d/c.  But not safe to discharge home.  Patient medically stable to discharge to a skilled level of care.     Consultants:   Neurology  Procedures:   None  Antimicrobials:   None   Subjective: Patient seen and examined.  No overnight events.  Is worried about his right forearm and hand, he keeps bumping it into something as he does not have any sensation on it. He also has problem with a corn on his foot. Patient was on the phone with his daughter while I walked in, I offered to talk to her but he said no need to talk to her.  Objective: Vitals:   09/02/20  [id number] 09/02/20 1417 09/02/20 2042 09/03/20 0511  BP: (!) 148/75 130/77 130/62 (!) 144/80  Pulse: (!) 50 60 (!) 57 (!) 51  Resp: 18 16 15 15   Temp: 97.8 F (36.6 C) 97.9 F (36.6 C) 97.8 F (36.6 C) (!) 97.5 F (36.4 C)  TempSrc: Oral Oral    SpO2: 100% 100% 100% 100%  Weight:      Height:        Intake/Output Summary (Last 24 hours) at 09/03/2020 1156 Last data filed at 09/03/2020 0500 Gross per 24 hour  Intake 7298.33 ml  Output 800 ml  Net 6498.33 ml   Filed Weights   08/31/20 1628  Weight: 63.5 kg     Examination:  General exam: Appears calm and comfortable  looks fairly comfortable on room air.  Frail and debilitated looking gentleman.  Not in any distress. Respiratory system: Clear to auscultation. Respiratory effort normal. Cardiovascular system: S1 & S2 heard, RRR.  Gastrointestinal system: Soft.  Nontender. Central nervous system: Alert and oriented. Extremities: Symmetric 5 x 5 power all 3 extremities. Right wrist, fingers with power 3/5. No touch or pressure sensation. Skin: No rashes, lesions or ulcers Psychiatry: Judgement and insight appear normal. Mood & affect flat.    Data Reviewed: I have personally reviewed following labs and imaging studies  CBC: Recent Labs  Lab 08/31/20 1644 09/01/20 0533 09/02/20 0459  WBC 10.3 8.3 6.1  NEUTROABS 9.2*  --   --   HGB 12.8* 14.0 12.3*  HCT 39.2 43.5 37.7*  MCV 89.9 91.2 88.9  PLT 121* 111* 121*   Basic Metabolic Panel: Recent Labs  Lab 08/31/20 1644 09/01/20 0533 09/02/20 0459  NA 140 139 138  K 4.0 3.9 3.1*  CL 107 107 107  CO2 24 22 23   GLUCOSE 128* 110* 92  BUN 25* 19 18  CREATININE 1.22 0.98 0.82  CALCIUM 9.1 8.8* 8.5*  MG  --  2.1  --   PHOS  --  2.9  --    GFR: Estimated Creatinine Clearance: 55.9 mL/min (by C-G formula based on SCr of 0.82 mg/dL). Liver Function Tests: Recent Labs  Lab 08/31/20 1644 09/01/20 0533 09/02/20 0459  AST 130* 173* 159*  ALT 143* 141* 115*  ALKPHOS 869* 866* 688*  BILITOT 1.1 1.4* 1.0  PROT 7.5 7.4 6.5  ALBUMIN 3.3* 3.5 2.9*   No results for input(s): LIPASE, AMYLASE in the last 168 hours. Recent Labs  Lab 08/31/20 2301  AMMONIA 44*   Coagulation Profile: Recent Labs  Lab 08/31/20 1644 09/01/20 0533  INR 1.0 1.0   Cardiac Enzymes: No results for input(s): CKTOTAL, CKMB, CKMBINDEX, TROPONINI in the last 168 hours. BNP (last 3 results) No results for input(s): PROBNP in the last 8760 hours. HbA1C: No results for input(s): HGBA1C in the last 72  hours. CBG: Recent Labs  Lab 09/01/20 1150  GLUCAP 96   Lipid Profile: No results for input(s): CHOL, HDL, LDLCALC, TRIG, CHOLHDL, LDLDIRECT in the last 72 hours. Thyroid Function Tests: No results for input(s): TSH, T4TOTAL, FREET4, T3FREE, THYROIDAB in the last 72 hours. Anemia Panel: No results for input(s): VITAMINB12, FOLATE, FERRITIN, TIBC, IRON, RETICCTPCT in the last 72 hours. Sepsis Labs: Recent Labs  Lab 08/31/20 2123 08/31/20 2301 09/01/20 0248 09/01/20 0533  LATICACIDVEN 2.3* 2.3* 1.5 1.9    Recent Results (from the past 240 hour(s))  Blood Cultures (routine x 2)     Status: None (Preliminary result)   Collection Time: 08/31/20  4:45 PM  Specimen: BLOOD RIGHT FOREARM  Result Value Ref Range Status   Specimen Description   Final    BLOOD RIGHT FOREARM Performed at [facility], [address] [place], [place] [address]    Special Requests   Final    BOTTLES DRAWN AEROBIC AND ANAEROBIC Blood Culture adequate volume Performed at [facility], [address] [place], [place] [address]    Culture  Setup Time   Final    GRAM POSITIVE RODS AEROBIC BOTTLE ONLY Gram Stain Report Called to,Read Back By and Verified With: DOSS,MICHAEL RN  09/01/20 BY JONES,T [facility] Performed at [facility], [address], [place], [place] [address]    Culture GRAM POSITIVE RODS  Final   Report Status PENDING  Incomplete  Resp Panel by RT-PCR (Flu A&B, Covid) Nasopharyngeal Swab     Status: None   Collection Time: 08/31/20  6:13 PM   Specimen: Nasopharyngeal Swab; Nasopharyngeal(NP) swabs in vial transport medium  Result Value Ref Range Status   SARS Coronavirus 2 by RT PCR NEGATIVE NEGATIVE Final    Comment: (NOTE) SARS-CoV-2 target nucleic acids are NOT DETECTED.  The SARS-CoV-2 RNA is generally detectable in upper respiratory specimens during the acute phase of infection. The lowest concentration of SARS-CoV-2 viral copies this assay can detect is 138 copies/mL. A  negative result does not preclude SARS-Cov-2 infection and should not be used as the sole basis for treatment or other patient management decisions. A negative result may occur with  improper specimen collection/handling, submission of specimen other than nasopharyngeal swab, presence of viral mutation(s) within the areas targeted by this assay, and inadequate number of viral copies(<138 copies/mL). A negative result must be combined with clinical observations, patient history, and epidemiological information. The expected result is Negative.  Fact Sheet for Patients:  BloggerCourse.com  Fact Sheet for Healthcare Providers:  SeriousBroker.it  This test is no t yet approved or cleared by the [place] FDA and  has been authorized for detection and/or diagnosis of SARS-CoV-2 by FDA under an Emergency Use Authorization (EUA). This EUA will remain  in effect (meaning this test can be used) for the duration of the COVID-19 declaration under Section 564(b)(1) of the Act, 21 U.S.C.section 360bbb-3(b)(1), unless the authorization is terminated  or revoked sooner.       Influenza A by PCR NEGATIVE NEGATIVE Final   Influenza B by PCR NEGATIVE NEGATIVE Final    Comment: (NOTE) The Xpert Xpress SARS-CoV-2/FLU/RSV plus assay is intended as an aid in the diagnosis of influenza from Nasopharyngeal swab specimens and should not be used as a sole basis for treatment. Nasal washings and aspirates are unacceptable for Xpert Xpress SARS-CoV-2/FLU/RSV testing.  Fact Sheet for Patients: BloggerCourse.com  Fact Sheet for Healthcare Providers: SeriousBroker.it  This test is not yet approved or cleared by the [place] FDA and has been authorized for detection and/or diagnosis of SARS-CoV-2 by FDA under an Emergency Use Authorization (EUA). This EUA will remain in effect (meaning this test can  be used) for the duration of the COVID-19 declaration under Section 564(b)(1) of the Act, 21 U.S.C. section 360bbb-3(b)(1), unless the authorization is terminated or revoked.  Performed at [facility], [address], [place], [place] [address]   Blood Cultures (routine x 2)     Status: None (Preliminary result)   Collection Time: 08/31/20  9:23 PM   Specimen: BLOOD RIGHT ARM  Result Value Ref Range Status   Specimen Description BLOOD RIGHT ARM  Final   Special Requests  Final    BOTTLES DRAWN AEROBIC AND ANAEROBIC Blood Culture adequate volume   Culture   Final    NO GROWTH 3 DAYS Performed at [facility], [address] [place], [place] [address]    Report Status PENDING  Incomplete         Radiology Studies: MR BRAIN WO CONTRAST  Result Date: 09/01/2020 CLINICAL DATA:  Right upper extremity weakness EXAM: MRI HEAD WITHOUT CONTRAST TECHNIQUE: Multiplanar, multiecho pulse sequences of the brain and surrounding structures were obtained without intravenous contrast. COMPARISON:  06/05/2020 FINDINGS: Brain: There is no acute infarction or intracranial hemorrhage. There is no intracranial mass, mass effect, or edema. There is no hydrocephalus or extra-axial fluid collection. Ventricles and sulci are prominent reflecting generalized parenchymal volume loss. Patchy and confluent areas of T2 hyperintensity in the supratentorial white matter are nonspecific but probably reflect moderate chronic microvascular ischemic changes. There are chronic bilateral cerebellar and left thalamic infarcts. Vascular: Major vessel flow voids at the skull base are preserved. Skull and upper cervical spine: Normal marrow signal is preserved. Degenerative changes of the included cervical spine. Sinuses/Orbits: Minor mucosal thickening. Bilateral lens replacements. Other: Sella is unremarkable. Patchy right mastoid fluid opacification. IMPRESSION: No acute infarction, hemorrhage, or mass. Chronic microvascular  ischemic changes and chronic infarcts as seen previously. Electronically Signed   By: Guadlupe Spanish M.D.   On: 09/01/2020 14:48   CT HEAD CODE STROKE WO CONTRAST  Result Date: 09/01/2020 CLINICAL DATA:  Code stroke.  Right arm drift, numbness and tingling EXAM: CT HEAD WITHOUT CONTRAST CT ANGIOGRAPHY OF THE HEAD AND NECK TECHNIQUE: Contiguous axial images were obtained from the base of the skull through the vertex without intravenous contrast. Multidetector CT imaging of the head and neck was performed using the standard protocol during bolus administration of intravenous contrast. Multiplanar CT image reconstructions and MIPs were obtained to evaluate the vascular anatomy. Carotid stenosis measurements (when applicable) are obtained utilizing NASCET criteria, using the distal internal carotid diameter as the denominator. CONTRAST:  8mL OMNIPAQUE IOHEXOL 350 MG/ML SOLN COMPARISON:  CT head 08/31/2020 FINDINGS: CT HEAD Brain: There is no acute intracranial hemorrhage, mass effect, or edema. No new loss of gray-white differentiation. Age-indeterminate small vessel infarct of the left thalamus not definitely present on more remote studies. Chronic bilateral cerebellar infarcts are again seen. Patchy and confluent areas of hypoattenuation in the supratentorial white matter likely reflects stable chronic microvascular ischemic changes. There is no extra-axial fluid collection. Ventricles and sulci are stable in size and configuration. ASPECTS [facility] Stroke Program Early CT Score) - Ganglionic level infarction (caudate, lentiform nuclei, internal capsule, insula, M1-M3 cortex): 7 - Supraganglionic infarction (M4-M6 cortex): 3 Total score (0-10 with 10 being normal): 10 Vascular: No hyperdense vessel.There is atherosclerotic calcification at the skull base. Skull: Calvarium is unremarkable. Sinuses/Orbits: No acute finding. Other: None. Review of the MIP images confirms the above findings CTA NECK Aortic arch:  Great vessel origins are patent. Right carotid system: Patent. Trace calcified plaque at the ICA origin. No measurable stenosis. Left carotid system: Patent. No measurable stenosis at the ICA origin. Vertebral arteries: Patent. Skeleton: Advanced degenerative changes of the cervical spine. Other neck: No mass or adenopathy. Upper chest: No apical lung mass. Review of the MIP images confirms the above findings CTA HEAD Anterior circulation: Intracranial internal carotid arteries are patent with calcified plaque but no significant stenosis. Anterior and middle cerebral arteries are patent. Posterior circulation: Intracranial vertebral arteries are patent. Patent PICA origins. Right vertebral artery  becomes diminutive after PICA origin. Basilar artery is patent. There is focal marked stenosis at the level of the mid basilar. Small caliber distal basilar due to fetal or near fetal origin of both posterior cerebral arteries. Moderate stenosis of the proximal right P2 PCA. Mild, probably atherosclerotic irregularity of the left posterior communicating artery. Venous sinuses: Patent as allowed by contrast bolus timing. Review of the MIP images confirms the above findings IMPRESSION: No acute intracranial hemorrhage or evidence of acute infarction. 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 No large vessel occlusion or hemodynamically significant stenosis in the neck. No proximal intracranial vessel occlusion. Short segment marked stenosis of the mid basilar. Moderate stenosis proximal right P2 PCA. These results were communicated to Dr. Derry Lory At 12:24 pm on 09/01/2020 by text page via the [facility] messaging system. Electronically Signed   By: Guadlupe Spanish M.D.   On: 09/01/2020 12:54   CT ANGIO HEAD CODE STROKE  Result Date: 09/01/2020 CLINICAL DATA:  Code stroke.  Right arm drift, numbness and tingling EXAM: CT HEAD WITHOUT CONTRAST CT ANGIOGRAPHY OF THE HEAD AND NECK TECHNIQUE: Contiguous axial images  were obtained from the base of the skull through the vertex without intravenous contrast. Multidetector CT imaging of the head and neck was performed using the standard protocol during bolus administration of intravenous contrast. Multiplanar CT image reconstructions and MIPs were obtained to evaluate the vascular anatomy. Carotid stenosis measurements (when applicable) are obtained utilizing NASCET criteria, using the distal internal carotid diameter as the denominator. CONTRAST:  75mL OMNIPAQUE IOHEXOL 350 MG/ML SOLN COMPARISON:  CT head 08/31/2020 FINDINGS: CT HEAD Brain: There is no acute intracranial hemorrhage, mass effect, or edema. No new loss of gray-white differentiation. Age-indeterminate small vessel infarct of the left thalamus not definitely present on more remote studies. Chronic bilateral cerebellar infarcts are again seen. Patchy and confluent areas of hypoattenuation in the supratentorial white matter likely reflects stable chronic microvascular ischemic changes. There is no extra-axial fluid collection. Ventricles and sulci are stable in size and configuration. ASPECTS [facility] Stroke Program Early CT Score) - Ganglionic level infarction (caudate, lentiform nuclei, internal capsule, insula, M1-M3 cortex): 7 - Supraganglionic infarction (M4-M6 cortex): 3 Total score (0-10 with 10 being normal): 10 Vascular: No hyperdense vessel.There is atherosclerotic calcification at the skull base. Skull: Calvarium is unremarkable. Sinuses/Orbits: No acute finding. Other: None. Review of the MIP images confirms the above findings CTA NECK Aortic arch: Great vessel origins are patent. Right carotid system: Patent. Trace calcified plaque at the ICA origin. No measurable stenosis. Left carotid system: Patent. No measurable stenosis at the ICA origin. Vertebral arteries: Patent. Skeleton: Advanced degenerative changes of the cervical spine. Other neck: No mass or adenopathy. Upper chest: No apical lung mass. Review  of the MIP images confirms the above findings CTA HEAD Anterior circulation: Intracranial internal carotid arteries are patent with calcified plaque but no significant stenosis. Anterior and middle cerebral arteries are patent. Posterior circulation: Intracranial vertebral arteries are patent. Patent PICA origins. Right vertebral artery becomes diminutive after PICA origin. Basilar artery is patent. There is focal marked stenosis at the level of the mid basilar. Small caliber distal basilar due to fetal or near fetal origin of both posterior cerebral arteries. Moderate stenosis of the proximal right P2 PCA. Mild, probably atherosclerotic irregularity of the left posterior communicating artery. Venous sinuses: Patent as allowed by contrast bolus timing. Review of the MIP images confirms the above findings IMPRESSION: No acute intracranial hemorrhage or evidence of acute infarction. Possible age-indeterminate  small left thalamic infarct. No large vessel occlusion or hemodynamically significant stenosis in the neck. No proximal intracranial vessel occlusion. Short segment marked stenosis of the mid basilar. Moderate stenosis proximal right P2 PCA. These results were communicated to Dr. Derry Lory At 12:24 pm on 09/01/2020 by text page via the [facility] messaging system. Electronically Signed   By: Guadlupe Spanish M.D.   On: 09/01/2020 12:54   CT ANGIO NECK CODE STROKE  Result Date: 09/01/2020 CLINICAL DATA:  Code stroke.  Right arm drift, numbness and tingling EXAM: CT HEAD WITHOUT CONTRAST CT ANGIOGRAPHY OF THE HEAD AND NECK TECHNIQUE: Contiguous axial images were obtained from the base of the skull through the vertex without intravenous contrast. Multidetector CT imaging of the head and neck was performed using the standard protocol during bolus administration of intravenous contrast. Multiplanar CT image reconstructions and MIPs were obtained to evaluate the vascular anatomy. Carotid stenosis measurements (when  applicable) are obtained utilizing NASCET criteria, using the distal internal carotid diameter as the denominator. CONTRAST:  74mL OMNIPAQUE IOHEXOL 350 MG/ML SOLN COMPARISON:  CT head 08/31/2020 FINDINGS: CT HEAD Brain: There is no acute intracranial hemorrhage, mass effect, or edema. No new loss of gray-white differentiation. Age-indeterminate small vessel infarct of the left thalamus not definitely present on more remote studies. Chronic bilateral cerebellar infarcts are again seen. Patchy and confluent areas of hypoattenuation in the supratentorial white matter likely reflects stable chronic microvascular ischemic changes. There is no extra-axial fluid collection. Ventricles and sulci are stable in size and configuration. ASPECTS [facility] Stroke Program Early CT Score) - Ganglionic level infarction (caudate, lentiform nuclei, internal capsule, insula, M1-M3 cortex): 7 - Supraganglionic infarction (M4-M6 cortex): 3 Total score (0-10 with 10 being normal): 10 Vascular: No hyperdense vessel.There is atherosclerotic calcification at the skull base. Skull: Calvarium is unremarkable. Sinuses/Orbits: No acute finding. Other: None. Review of the MIP images confirms the above findings CTA NECK Aortic arch: Great vessel origins are patent. Right carotid system: Patent. Trace calcified plaque at the ICA origin. No measurable stenosis. Left carotid system: Patent. No measurable stenosis at the ICA origin. Vertebral arteries: Patent. Skeleton: Advanced degenerative changes of the cervical spine. Other neck: No mass or adenopathy. Upper chest: No apical lung mass. Review of the MIP images confirms the above findings CTA HEAD Anterior circulation: Intracranial internal carotid arteries are patent with calcified plaque but no significant stenosis. Anterior and middle cerebral arteries are patent. Posterior circulation: Intracranial vertebral arteries are patent. Patent PICA origins. Right vertebral artery becomes diminutive  after PICA origin. Basilar artery is patent. There is focal marked stenosis at the level of the mid basilar. Small caliber distal basilar due to fetal or near fetal origin of both posterior cerebral arteries. Moderate stenosis of the proximal right P2 PCA. Mild, probably atherosclerotic irregularity of the left posterior communicating artery. Venous sinuses: Patent as allowed by contrast bolus timing. Review of the MIP images confirms the above findings IMPRESSION: No acute intracranial hemorrhage or evidence of acute infarction. 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 No large vessel occlusion or hemodynamically significant stenosis in the neck. No proximal intracranial vessel occlusion. Short segment marked stenosis of the mid basilar. Moderate stenosis proximal right P2 PCA. These results were communicated to Dr. Derry Lory At 12:24 pm on 09/01/2020 by text page via the [facility] messaging system. Electronically Signed   By: Guadlupe Spanish M.D.   On: 09/01/2020 12:54        Scheduled Meds: . aspirin EC  81 mg Oral Daily  .  enoxaparin (LOVENOX) injection  40 mg Subcutaneous Q24H  . lactulose  20 g Oral TID  . potassium chloride  40 mEq Oral BID   Continuous Infusions: . sodium chloride 125 mL/hr at 09/01/20 1247     LOS: 3 days    Time spent: 30 minutes    Dorcas Carrow, MD [facility] Pager [phone]

## 2020-09-04 DIAGNOSIS — Z111 Encounter for screening for respiratory tuberculosis: Secondary | ICD-10-CM | POA: Diagnosis not present

## 2020-09-04 DIAGNOSIS — M6389 Disorders of muscle in diseases classified elsewhere, multiple sites: Secondary | ICD-10-CM | POA: Diagnosis not present

## 2020-09-04 DIAGNOSIS — M792 Neuralgia and neuritis, unspecified: Secondary | ICD-10-CM | POA: Diagnosis not present

## 2020-09-04 DIAGNOSIS — R1310 Dysphagia, unspecified: Secondary | ICD-10-CM | POA: Diagnosis not present

## 2020-09-04 DIAGNOSIS — G9340 Encephalopathy, unspecified: Secondary | ICD-10-CM | POA: Diagnosis not present

## 2020-09-04 DIAGNOSIS — R41841 Cognitive communication deficit: Secondary | ICD-10-CM | POA: Diagnosis not present

## 2020-09-04 DIAGNOSIS — W19XXXA Unspecified fall, initial encounter: Secondary | ICD-10-CM | POA: Diagnosis not present

## 2020-09-04 DIAGNOSIS — R55 Syncope and collapse: Secondary | ICD-10-CM | POA: Diagnosis not present

## 2020-09-04 DIAGNOSIS — E785 Hyperlipidemia, unspecified: Secondary | ICD-10-CM | POA: Diagnosis not present

## 2020-09-04 DIAGNOSIS — K5900 Constipation, unspecified: Secondary | ICD-10-CM | POA: Diagnosis not present

## 2020-09-04 DIAGNOSIS — M6281 Muscle weakness (generalized): Secondary | ICD-10-CM | POA: Diagnosis not present

## 2020-09-04 DIAGNOSIS — G9341 Metabolic encephalopathy: Secondary | ICD-10-CM | POA: Diagnosis not present

## 2020-09-04 DIAGNOSIS — I739 Peripheral vascular disease, unspecified: Secondary | ICD-10-CM | POA: Diagnosis not present

## 2020-09-04 DIAGNOSIS — E860 Dehydration: Secondary | ICD-10-CM | POA: Diagnosis not present

## 2020-09-04 DIAGNOSIS — R7989 Other specified abnormal findings of blood chemistry: Secondary | ICD-10-CM | POA: Diagnosis not present

## 2020-09-04 DIAGNOSIS — S4491XA Injury of unspecified nerve at shoulder and upper arm level, right arm, initial encounter: Secondary | ICD-10-CM | POA: Diagnosis present

## 2020-09-04 DIAGNOSIS — R2681 Unsteadiness on feet: Secondary | ICD-10-CM | POA: Diagnosis not present

## 2020-09-04 DIAGNOSIS — S4491XD Injury of unspecified nerve at shoulder and upper arm level, right arm, subsequent encounter: Secondary | ICD-10-CM | POA: Diagnosis not present

## 2020-09-04 DIAGNOSIS — R400 Somnolence: Secondary | ICD-10-CM | POA: Diagnosis not present

## 2020-09-04 DIAGNOSIS — G5601 Carpal tunnel syndrome, right upper limb: Secondary | ICD-10-CM | POA: Diagnosis not present

## 2020-09-04 DIAGNOSIS — R001 Bradycardia, unspecified: Secondary | ICD-10-CM | POA: Diagnosis not present

## 2020-09-04 DIAGNOSIS — L97524 Non-pressure chronic ulcer of other part of left foot with necrosis of bone: Secondary | ICD-10-CM | POA: Diagnosis not present

## 2020-09-04 DIAGNOSIS — R7401 Elevation of levels of liver transaminase levels: Secondary | ICD-10-CM | POA: Diagnosis not present

## 2020-09-04 DIAGNOSIS — M2042 Other hammer toe(s) (acquired), left foot: Secondary | ICD-10-CM | POA: Diagnosis not present

## 2020-09-04 LAB — RESP PANEL BY RT-PCR (FLU A&B, COVID) ARPGX2
Influenza A by PCR: NEGATIVE
Influenza B by PCR: NEGATIVE
SARS Coronavirus 2 by RT PCR: NEGATIVE

## 2020-09-04 LAB — CULTURE, BLOOD (ROUTINE X 2): Special Requests: ADEQUATE

## 2020-09-04 MED ORDER — LACTULOSE 10 GM/15ML PO SOLN: 20.0000 g | Freq: Two times a day (BID) | ORAL | 0 refills | Status: DC | PRN

## 2020-09-04 NOTE — Plan of Care (Signed)
  Problem: Health Behavior/Discharge Planning: Goal: Ability to manage health-related needs will improve 09/04/2020 1237 by Karolee Ohs, RN Outcome: Adequate for Discharge 09/04/2020 1233 by Karolee Ohs, RN Outcome: Progressing   Problem: Clinical Measurements: Goal: Ability to maintain clinical measurements within normal limits will improve Outcome: Adequate for Discharge Goal: Will remain free from infection Outcome: Adequate for Discharge Goal: Diagnostic test results will improve Outcome: Adequate for Discharge Goal: Respiratory complications will improve Outcome: Adequate for Discharge Goal: Cardiovascular complication will be avoided Outcome: Adequate for Discharge   Problem: Activity: Goal: Risk for activity intolerance will decrease Outcome: Adequate for Discharge   Problem: Nutrition: Goal: Adequate nutrition will be maintained Outcome: Adequate for Discharge   Problem: Coping: Goal: Level of anxiety will decrease Outcome: Adequate for Discharge   Problem: Elimination: Goal: Will not experience complications related to bowel motility Outcome: Adequate for Discharge Goal: Will not experience complications related to urinary retention Outcome: Adequate for Discharge   Problem: Pain Managment: Goal: General experience of comfort will improve Outcome: Adequate for Discharge   Problem: Safety: Goal: Ability to remain free from injury will improve Outcome: Adequate for Discharge   Problem: Skin Integrity: Goal: Risk for impaired skin integrity will decrease Outcome: Adequate for Discharge   Problem: Education: Goal: Knowledge of disease or condition will improve 09/04/2020 1237 by Karolee Ohs, RN Outcome: Adequate for Discharge 09/04/2020 1236 by Karolee Ohs, RN Outcome: Progressing Goal: Knowledge of secondary prevention will improve 09/04/2020 1237 by Karolee Ohs, RN Outcome: Adequate for Discharge 09/04/2020 1236 by Karolee Ohs,  RN Outcome: Progressing   Problem: Coping: Goal: Will verbalize positive feelings about self 09/04/2020 1237 by Karolee Ohs, RN Outcome: Adequate for Discharge 09/04/2020 1236 by Karolee Ohs, RN Outcome: Progressing   Problem: Self-Care: Goal: Ability to participate in self-care as condition permits will improve 09/04/2020 1237 by Karolee Ohs, RN Outcome: Adequate for Discharge 09/04/2020 1236 by Karolee Ohs, RN Outcome: Progressing   Problem: Ischemic Stroke/TIA Tissue Perfusion: Goal: Complications of ischemic stroke/TIA will be minimized 09/04/2020 1237 by Karolee Ohs, RN Outcome: Adequate for Discharge 09/04/2020 1236 by Karolee Ohs, RN Outcome: Progressing

## 2020-09-04 NOTE — Discharge Summary (Signed)
Physician Discharge Summary  Charles Morgan OMV:[id number] DOB: Dec 29, 1929 DOA: 08/31/2020  PCP: Patient, No Pcp Per  Admit date: 08/31/2020 Discharge date: 09/04/2020  Admitted From: Home Disposition: Skilled nursing facility  Recommendations for Outpatient Follow-up:  1. Follow up with PCP in 1-2 weeks 2. Please obtain CMP, magnesium, phosphorus/CBC in one week   Home Health: Not applicable Equipment/Devices: Not applicable   Discharge Condition: Stable CODE STATUS: DNR, full scope of treatment Diet recommendation: Low-salt diet  Discharge summary:  86 year old gentleman with history of vertebrobasilar insufficiency, recurrent syncope, history of stroke and aortic stenosis.  History of DVT.  Autonomic dysfunction who was recently admitted with confusion presented back to the hospital with weakness, vomiting and confusion. He was found on the floor.  After waking up in the hospital he also complained of right forearm and wrist weakness.  In the emergency room, hemodynamically stable.  Mild lactic acidosis.  Mild transaminitis.  CT scan abdomen pelvis with fatty liver.  CT head unremarkable.  Treated with IV fluids. 12/7, stroke alert called after patient noted weakness on the right arm when his mental status improved.  Acute stroke ruled out.  Probably peripheral neuropathy.   Assessment & plan of care:   Acute metabolic encephalopathy: Probably due to combination of factors including fall, syncope and dehydration.  No new focal deficit.  Does have history of cardiovascular insufficiency and recurrent syncope with dehydration and orthostasis. -Currently improved.  CT scan of the head and MRI of the brain with no evidence of stroke.  Mental status improved. -Patient woke up in the hospital and complained of numbness and weakness of the right wrist, a stroke alert was called.  Negative for stroke.  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 -Does  have history of TIA, he will continue aspirin.  Will resume statin today as his liver function test has been trending down and less than 3 times normal. All electrolyte abnormalities were corrected.  Right upper extremity neuropraxia: Continue to work with physical and Occupational Therapy.  MRI brain normal. 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  Abnormal LFTs: Cause unknown.  Might have suffered shock liver.  May have a component of chronic liver disease from fatty liver.  Recheck in 1 week to ensure stabilization.  Electrolyte abnormalities: Replaced and corrected.  Constipation: Stool softener and lactulose to use.  Social: DSS involved.  They will follow up.  Patient will need safe disposition plan from nursing home. All 3 scope of medical treatment discussed with the patient. Patient is DNR with full scope of treatment.  Patient is medically stable.  He does not have further orthostasis.  Will allow slightly above normal blood pressure to avoid orthostasis drop and syncope.  He is able to go to a skilled nursing facility today.  Please schedule follow-up with primary care physician after discharge.  Discharge Diagnoses:  Principal Problem:   Syncope and collapse Active Problems:   Syncope   Dehydration   HLD (hyperlipidemia)   Transaminasemia   Thrombocytopenia (HCC)   Stroke (HCC)   Altered mental status   Lactic acidosis   Hypoalbuminemia   Hyperammonemia (HCC)   Neuropraxia of right upper extremity, initial encounter    Discharge Instructions  Discharge Instructions    Diet - low sodium heart healthy   Complete by: As directed    Increase activity slowly   Complete by: As directed    No wound care  Complete by: As directed      Allergies as of 09/04/2020      Reactions   Tuberculin Rash      Medication List    TAKE these medications   acetaminophen 325 MG tablet Commonly known as: TYLENOL Take  325 mg by mouth every 6 (six) hours as needed.   ammonium lactate 12 % lotion Commonly known as: LAC-HYDRIN Apply 1 application topically as needed for dry skin.   aspirin EC 81 MG tablet Take 81 mg by mouth daily. Swallow whole.   atorvastatin 80 MG tablet Commonly known as: LIPITOR Take 1 tablet (80 mg total) by mouth daily.   Brinzolamide-Brimonidine 1-0.2 % Susp Place 1 drop into both eyes in the morning and at bedtime.   docusate sodium 100 MG capsule Commonly known as: COLACE Take 100 mg by mouth 2 (two) times daily.   lactulose 10 GM/15ML solution Commonly known as: CHRONULAC Take 30 mLs (20 g total) by mouth 2 (two) times daily as needed for mild constipation or moderate constipation.       Contact information for after-discharge care    Destination    [facility] .   Service: Skilled Nursing Contact information: [address] [place] [address] [phone]                 Allergies  Allergen Reactions  . Tuberculin Rash    Consultations:  Telemetry neuro   Procedures/Studies: CT HEAD WO CONTRAST  Result Date: 08/31/2020 CLINICAL DATA:  Altered mental status. EXAM: CT HEAD WITHOUT CONTRAST TECHNIQUE: Contiguous axial images were obtained from the base of the skull through the vertex without intravenous contrast. COMPARISON:  June 04, 2020 FINDINGS: Brain: There is mild to moderate severity cerebral atrophy with widening of the extra-axial spaces and ventricular dilatation. There are areas of decreased attenuation within the white matter tracts of the supratentorial brain, consistent with microvascular disease changes. A stable area of cortical encephalomalacia, with adjacent chronic white matter low attenuation, are seen within the bilateral cerebellum. Vascular: No hyperdense vessel or unexpected calcification. Skull: Normal. Negative for fracture or focal lesion. Sinuses/Orbits: No acute finding.  Other: None. IMPRESSION: 1. Generalized cerebral atrophy with chronic bilateral cerebellar infarcts. 2. No acute intracranial abnormality. Electronically Signed   By: Aram Candela M.D.   On: 08/31/2020 20:15   MR BRAIN WO CONTRAST  Result Date: 09/01/2020 CLINICAL DATA:  Right upper extremity weakness EXAM: MRI HEAD WITHOUT CONTRAST TECHNIQUE: Multiplanar, multiecho pulse sequences of the brain and surrounding structures were obtained without intravenous contrast. COMPARISON:  06/05/2020 FINDINGS: Brain: There is no acute infarction or intracranial hemorrhage. There is no intracranial mass, mass effect, or edema. There is no hydrocephalus or extra-axial fluid collection. Ventricles and sulci are prominent reflecting generalized parenchymal volume loss. Patchy and confluent areas of T2 hyperintensity in the supratentorial white matter are nonspecific but probably reflect moderate chronic microvascular ischemic changes. There are chronic bilateral cerebellar and left thalamic infarcts. Vascular: Major vessel flow voids at the skull base are preserved. Skull and upper cervical spine: Normal marrow signal is preserved. Degenerative changes of the included cervical spine. Sinuses/Orbits: Minor mucosal thickening. Bilateral lens replacements. Other: Sella is unremarkable. Patchy right mastoid fluid opacification. IMPRESSION: No acute infarction, hemorrhage, or mass. Chronic microvascular ischemic changes and chronic infarcts as seen previously. Electronically Signed   By: Guadlupe Spanish M.D.   On: 09/01/2020 14:48   CT ABDOMEN PELVIS W CONTRAST  Result Date: 08/31/2020 CLINICAL  DATA:  Abdominal pain and altered mental status. EXAM: CT ABDOMEN AND PELVIS WITH CONTRAST TECHNIQUE: Multidetector CT imaging of the abdomen and pelvis was performed using the standard protocol following bolus administration of intravenous contrast. CONTRAST:  OMNIPAQUE IOHEXOL 300 MG/ML  SOLN COMPARISON:  May 19, 2020  FINDINGS: Lower chest: There is mild to moderate severity cardiomegaly. Mild atelectasis is seen within the bilateral lung bases. Hepatobiliary: There is diffuse fatty infiltration of the liver parenchyma. No focal liver abnormality is seen. No gallstones or gallbladder wall thickening. Stable dilatation of the common bile duct is noted. Pancreas: Unremarkable. No pancreatic ductal dilatation or surrounding inflammatory changes. Spleen: Normal in size without focal abnormality. Adrenals/Urinary Tract: Adrenal glands are unremarkable. Kidneys are normal in size, without renal calculi or hydronephrosis. Multiple subcentimeter cysts are seen within the right kidney. Bladder is unremarkable. Stomach/Bowel: There is a small hiatal hernia. The appendix is not clearly identified. No evidence of bowel wall dilatation. Noninflamed diverticula are seen throughout the large bowel. Vascular/Lymphatic: Aortic atherosclerosis. No enlarged abdominal or pelvic lymph nodes. Reproductive: The prostate gland is moderately enlarged. Other: No abdominal wall hernia or abnormality. No abdominopelvic ascites. Musculoskeletal: Multilevel degenerative changes seen throughout the lumbar spine. IMPRESSION: 1. Mild to moderate severity cardiomegaly. 2. Fatty liver. 3. Small hiatal hernia. 4. Colonic diverticulosis. 5. Enlarged prostate gland. 6. Aortic atherosclerosis. Aortic Atherosclerosis (ICD10-I70.0). Electronically Signed   By: Aram Candela M.D.   On: 08/31/2020 20:20   DG Chest Port 1 View  Result Date: 08/31/2020 CLINICAL DATA:  86 year old male found down, head vomited. EXAM: PORTABLE CHEST 1 VIEW COMPARISON:  Chest radiographs 06/04/2020. FINDINGS: Portable AP upright view at 1654 hours. Stable implanted right chest wall cardiac device. Stable mild cardiomegaly. Other mediastinal contours are within normal limits. Visualized tracheal air column is within normal limits. Allowing for portable technique the lungs are clear. No  pneumothorax or pleural effusion is evident. Increased bowel gas in the visible upper abdomen although no dilated loops are evident. No acute osseous abnormality identified. IMPRESSION: 1. No acute cardiopulmonary abnormality. 2. Increased gas in nondilated bowel in the visible upper abdomen. Electronically Signed   By: Odessa Fleming M.D.   On: 08/31/2020 17:18   CT HEAD CODE STROKE WO CONTRAST  Result Date: 09/01/2020 CLINICAL DATA:  Code stroke.  Right arm drift, numbness and tingling EXAM: CT HEAD WITHOUT CONTRAST CT ANGIOGRAPHY OF THE HEAD AND NECK TECHNIQUE: Contiguous axial images were obtained from the base of the skull through the vertex without intravenous contrast. Multidetector CT imaging of the head and neck was performed using the standard protocol during bolus administration of intravenous contrast. Multiplanar CT image reconstructions and MIPs were obtained to evaluate the vascular anatomy. Carotid stenosis measurements (when applicable) are obtained utilizing NASCET criteria, using the distal internal carotid diameter as the denominator. CONTRAST:  75mL OMNIPAQUE IOHEXOL 350 MG/ML SOLN COMPARISON:  CT head 08/31/2020 FINDINGS: CT HEAD Brain: There is no acute intracranial hemorrhage, mass effect, or edema. No new loss of gray-white differentiation. Age-indeterminate small vessel infarct of the left thalamus not definitely present on more remote studies. Chronic bilateral cerebellar infarcts are again seen. Patchy and confluent areas of hypoattenuation in the supratentorial white matter likely reflects stable chronic microvascular ischemic changes. There is no extra-axial fluid collection. Ventricles and sulci are stable in size and configuration. ASPECTS [facility] Stroke Program Early CT Score) - Ganglionic level infarction (caudate, lentiform nuclei, internal capsule, insula, M1-M3 cortex): 7 - Supraganglionic infarction (M4-M6 cortex): 3 Total  score (0-10 with 10 being normal): 10 Vascular: No  hyperdense vessel.There is atherosclerotic calcification at the skull base. Skull: Calvarium is unremarkable. Sinuses/Orbits: No acute finding. Other: None. Review of the MIP images confirms the above findings CTA NECK Aortic arch: Great vessel origins are patent. Right carotid system: Patent. Trace calcified plaque at the ICA origin. No measurable stenosis. Left carotid system: Patent. No measurable stenosis at the ICA origin. Vertebral arteries: Patent. Skeleton: Advanced degenerative changes of the cervical spine. Other neck: No mass or adenopathy. Upper chest: No apical lung mass. Review of the MIP images confirms the above findings CTA HEAD Anterior circulation: Intracranial internal carotid arteries are patent with calcified plaque but no significant stenosis. Anterior and middle cerebral arteries are patent. Posterior circulation: Intracranial vertebral arteries are patent. Patent PICA origins. Right vertebral artery becomes diminutive after PICA origin. Basilar artery is patent. There is focal marked stenosis at the level of the mid basilar. Small caliber distal basilar due to fetal or near fetal origin of both posterior cerebral arteries. Moderate stenosis of the proximal right P2 PCA. Mild, probably atherosclerotic irregularity of the left posterior communicating artery. Venous sinuses: Patent as allowed by contrast bolus timing. Review of the MIP images confirms the above findings IMPRESSION: No acute intracranial hemorrhage or evidence of acute infarction. 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 No large vessel occlusion or hemodynamically significant stenosis in the neck. No proximal intracranial vessel occlusion. Short segment marked stenosis of the mid basilar. Moderate stenosis proximal right P2 PCA. These results were communicated to Dr. Derry Lory At 12:24 pm on 09/01/2020 by text page via the [facility] messaging system. Electronically Signed   By: Guadlupe Spanish M.D.   On: 09/01/2020  12:54   CT ANGIO HEAD CODE STROKE  Result Date: 09/01/2020 CLINICAL DATA:  Code stroke.  Right arm drift, numbness and tingling EXAM: CT HEAD WITHOUT CONTRAST CT ANGIOGRAPHY OF THE HEAD AND NECK TECHNIQUE: Contiguous axial images were obtained from the base of the skull through the vertex without intravenous contrast. Multidetector CT imaging of the head and neck was performed using the standard protocol during bolus administration of intravenous contrast. Multiplanar CT image reconstructions and MIPs were obtained to evaluate the vascular anatomy. Carotid stenosis measurements (when applicable) are obtained utilizing NASCET criteria, using the distal internal carotid diameter as the denominator. CONTRAST:  75mL OMNIPAQUE IOHEXOL 350 MG/ML SOLN COMPARISON:  CT head 08/31/2020 FINDINGS: CT HEAD Brain: There is no acute intracranial hemorrhage, mass effect, or edema. No new loss of gray-white differentiation. Age-indeterminate small vessel infarct of the left thalamus not definitely present on more remote studies. Chronic bilateral cerebellar infarcts are again seen. Patchy and confluent areas of hypoattenuation in the supratentorial white matter likely reflects stable chronic microvascular ischemic changes. There is no extra-axial fluid collection. Ventricles and sulci are stable in size and configuration. ASPECTS [facility] Stroke Program Early CT Score) - Ganglionic level infarction (caudate, lentiform nuclei, internal capsule, insula, M1-M3 cortex): 7 - Supraganglionic infarction (M4-M6 cortex): 3 Total score (0-10 with 10 being normal): 10 Vascular: No hyperdense vessel.There is atherosclerotic calcification at the skull base. Skull: Calvarium is unremarkable. Sinuses/Orbits: No acute finding. Other: None. Review of the MIP images confirms the above findings CTA NECK Aortic arch: Great vessel origins are patent. Right carotid system: Patent. Trace calcified plaque at the ICA origin. No measurable stenosis. Left  carotid system: Patent. No measurable stenosis at the ICA origin. Vertebral arteries: Patent. Skeleton: Advanced degenerative changes of the cervical spine. Other neck:  No mass or adenopathy. Upper chest: No apical lung mass. Review of the MIP images confirms the above findings CTA HEAD Anterior circulation: Intracranial internal carotid arteries are patent with calcified plaque but no significant stenosis. Anterior and middle cerebral arteries are patent. Posterior circulation: Intracranial vertebral arteries are patent. Patent PICA origins. Right vertebral artery becomes diminutive after PICA origin. Basilar artery is patent. There is focal marked stenosis at the level of the mid basilar. Small caliber distal basilar due to fetal or near fetal origin of both posterior cerebral arteries. Moderate stenosis of the proximal right P2 PCA. Mild, probably atherosclerotic irregularity of the left posterior communicating artery. Venous sinuses: Patent as allowed by contrast bolus timing. Review of the MIP images confirms the above findings IMPRESSION: No acute intracranial hemorrhage or evidence of acute infarction. 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 No large vessel occlusion or hemodynamically significant stenosis in the neck. No proximal intracranial vessel occlusion. Short segment marked stenosis of the mid basilar. Moderate stenosis proximal right P2 PCA. These results were communicated to Dr. Derry LoryKhaliqdina At 12:24 pm on 09/01/2020 by text page via the [facility] messaging system. Electronically Signed   By: Guadlupe SpanishPraneil  Patel M.D.   On: 09/01/2020 12:54   CT ANGIO NECK CODE STROKE  Result Date: 09/01/2020 CLINICAL DATA:  Code stroke.  Right arm drift, numbness and tingling EXAM: CT HEAD WITHOUT CONTRAST CT ANGIOGRAPHY OF THE HEAD AND NECK TECHNIQUE: Contiguous axial images were obtained from the base of the skull through the vertex without intravenous contrast. Multidetector CT imaging of the head and  neck was performed using the standard protocol during bolus administration of intravenous contrast. Multiplanar CT image reconstructions and MIPs were obtained to evaluate the vascular anatomy. Carotid stenosis measurements (when applicable) are obtained utilizing NASCET criteria, using the distal internal carotid diameter as the denominator. CONTRAST:  75mL OMNIPAQUE IOHEXOL 350 MG/ML SOLN COMPARISON:  CT head 08/31/2020 FINDINGS: CT HEAD Brain: There is no acute intracranial hemorrhage, mass effect, or edema. No new loss of gray-white differentiation. Age-indeterminate small vessel infarct of the left thalamus not definitely present on more remote studies. Chronic bilateral cerebellar infarcts are again seen. Patchy and confluent areas of hypoattenuation in the supratentorial white matter likely reflects stable chronic microvascular ischemic changes. There is no extra-axial fluid collection. Ventricles and sulci are stable in size and configuration. ASPECTS [facility] Stroke Program Early CT Score) - Ganglionic level infarction (caudate, lentiform nuclei, internal capsule, insula, M1-M3 cortex): 7 - Supraganglionic infarction (M4-M6 cortex): 3 Total score (0-10 with 10 being normal): 10 Vascular: No hyperdense vessel.There is atherosclerotic calcification at the skull base. Skull: Calvarium is unremarkable. Sinuses/Orbits: No acute finding. Other: None. Review of the MIP images confirms the above findings CTA NECK Aortic arch: Great vessel origins are patent. Right carotid system: Patent. Trace calcified plaque at the ICA origin. No measurable stenosis. Left carotid system: Patent. No measurable stenosis at the ICA origin. Vertebral arteries: Patent. Skeleton: Advanced degenerative changes of the cervical spine. Other neck: No mass or adenopathy. Upper chest: No apical lung mass. Review of the MIP images confirms the above findings CTA HEAD Anterior circulation: Intracranial internal carotid arteries are patent with  calcified plaque but no significant stenosis. Anterior and middle cerebral arteries are patent. Posterior circulation: Intracranial vertebral arteries are patent. Patent PICA origins. Right vertebral artery becomes diminutive after PICA origin. Basilar artery is patent. There is focal marked stenosis at the level of the mid basilar. Small caliber distal basilar due to fetal or  near fetal origin of both posterior cerebral arteries. Moderate stenosis of the proximal right P2 PCA. Mild, probably atherosclerotic irregularity of the left posterior communicating artery. Venous sinuses: Patent as allowed by contrast bolus timing. Review of the MIP images confirms the above findings IMPRESSION: No acute intracranial hemorrhage or evidence of acute infarction. 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 No large vessel occlusion or hemodynamically significant stenosis in the neck. No proximal intracranial vessel occlusion. Short segment marked stenosis of the mid basilar. Moderate stenosis proximal right P2 PCA. These results were communicated to Dr. Derry Lory At 12:24 pm on 09/01/2020 by text page via the [facility] messaging system. Electronically Signed   By: Guadlupe Spanish M.D.   On: 09/01/2020 12:54   US Abdomen Limited RUQ (LIVER/GB)  Result Date: 09/01/2020 CLINICAL DATA:  Elevated liver function tests. EXAM: ULTRASOUND ABDOMEN LIMITED RIGHT UPPER QUADRANT COMPARISON:  August 31, 2020. FINDINGS: Gallbladder: No gallstones or wall thickening visualized. No sonographic Murphy sign noted by sonographer. Common bile duct: Diameter: 12 mm proximally which is dilated. Liver: No focal lesion identified. Within normal limits in parenchymal echogenicity. Portal vein is patent on color Doppler imaging with normal direction of blood flow towards the liver. Other: None. IMPRESSION: Dilated common bile duct is noted. Correlation with liver function tests is recommended to evaluate for possible distal common bile duct  obstruction. No other abnormality seen in the right upper quadrant of the abdomen. Electronically Signed   By: Lupita Raider M.D.   On: 09/01/2020 09:20    (Echo, Carotid, EGD, Colonoscopy, ERCP)    Subjective: Patient seen and examined.  No overnight events.  Right wrist and fingers continue to be weak and is frustrated with that.  Otherwise appetite is good.  He mobilized with no orthostatic or syncope. He is looking forward to go to rehab.     Discharge Exam: Vitals:   09/03/20 2037 09/04/20 0411  BP:  (!) 150/83  Pulse:  (!) 50  Resp:  16  Temp:  (!) 97.5 F (36.4 C)  SpO2: 98% 99%   Vitals:   09/03/20 0511 09/03/20 1313 09/03/20 2037 09/04/20 0411  BP: (!) 144/80 (!) 137/92  (!) 150/83  Pulse: (!) 51 64  (!) 50  Resp: 15 18  16   Temp: (!) 97.5 F (36.4 C) 99.4 F (37.4 C)  (!) 97.5 F (36.4 C)  TempSrc:  Oral    SpO2: 100% 100% 98% 99%  Weight:      Height:        General: Pt is alert, awake, not in acute distress Comfortable looking. Cardiovascular: RRR, S1/S2 +, no rubs, no gallops Respiratory: CTA bilaterally, no wheezing, no rhonchi Abdominal: Soft, NT, ND, bowel sounds + Extremities: no edema, no cyanosis Right wrist extension 3/5 Fingers 2/5 Supervision sensation not present on all III nerve distribution in the hand. Elbow flexion and extension 4/5.    The results of significant diagnostics from this hospitalization (including imaging, microbiology, ancillary and laboratory) are listed below for reference.     Microbiology: Recent Results (from the past 240 hour(s))  Blood Cultures (routine x 2)     Status: Abnormal   Collection Time: 08/31/20  4:45 PM   Specimen: BLOOD RIGHT FOREARM  Result Value Ref Range Status   Specimen Description   Final    BLOOD RIGHT FOREARM Performed at [facility], [address] [place], [place]    Special Requests   Final    BOTTLES DRAWN AEROBIC AND  ANAEROBIC Blood Culture adequate volume Performed  at [facility], [address], [place], [place] [address]    Culture  Setup Time   Final    GRAM POSITIVE RODS AEROBIC BOTTLE ONLY Gram Stain Report Called to,Read Back By and Verified With: DOSS,MICHAEL RN @0802  09/01/20 BY JONES,T [facility] Performed at [facility], [address], [place], [place] [address]    Culture (A)  Final    BACILLUS SPECIES Standardized susceptibility testing for this organism is not available. Performed at [facility], [address] [place], [place] [address]    Report Status 09/04/2020 FINAL  Final  Resp Panel by RT-PCR (Flu A&B, Covid) Nasopharyngeal Swab     Status: None   Collection Time: 08/31/20  6:13 PM   Specimen: Nasopharyngeal Swab; Nasopharyngeal(NP) swabs in vial transport medium  Result Value Ref Range Status   SARS Coronavirus 2 by RT PCR NEGATIVE NEGATIVE Final    Comment: (NOTE) SARS-CoV-2 target nucleic acids are NOT DETECTED.  The SARS-CoV-2 RNA is generally detectable in upper respiratory specimens during the acute phase of infection. The lowest concentration of SARS-CoV-2 viral copies this assay can detect is 138 copies/mL. A negative result does not preclude SARS-Cov-2 infection and should not be used as the sole basis for treatment or other patient management decisions. A negative result may occur with  improper specimen collection/handling, submission of specimen other than nasopharyngeal swab, presence of viral mutation(s) within the areas targeted by this assay, and inadequate number of viral copies(<138 copies/mL). A negative result must be combined with clinical observations, patient history, and epidemiological information. The expected result is Negative.  Fact Sheet for Patients:  BloggerCourse.com  Fact Sheet for Healthcare Providers:  SeriousBroker.it  This test is no t yet approved or cleared by the [place] FDA and  has been authorized for detection and/or  diagnosis of SARS-CoV-2 by FDA under an Emergency Use Authorization (EUA). This EUA will remain  in effect (meaning this test can be used) for the duration of the COVID-19 declaration under Section 564(b)(1) of the Act, 21 U.S.C.section 360bbb-3(b)(1), unless the authorization is terminated  or revoked sooner.       Influenza A by PCR NEGATIVE NEGATIVE Final   Influenza B by PCR NEGATIVE NEGATIVE Final    Comment: (NOTE) The Xpert Xpress SARS-CoV-2/FLU/RSV plus assay is intended as an aid in the diagnosis of influenza from Nasopharyngeal swab specimens and should not be used as a sole basis for treatment. Nasal washings and aspirates are unacceptable for Xpert Xpress SARS-CoV-2/FLU/RSV testing.  Fact Sheet for Patients: BloggerCourse.com  Fact Sheet for Healthcare Providers: SeriousBroker.it  This test is not yet approved or cleared by the [place] FDA and has been authorized for detection and/or diagnosis of SARS-CoV-2 by FDA under an Emergency Use Authorization (EUA). This EUA will remain in effect (meaning this test can be used) for the duration of the COVID-19 declaration under Section 564(b)(1) of the Act, 21 U.S.C. section 360bbb-3(b)(1), unless the authorization is terminated or revoked.  Performed at [facility], [address], , [place] [address]   Blood Cultures (routine x 2)     Status: None (Preliminary result)   Collection Time: 08/31/20  9:23 PM   Specimen: BLOOD RIGHT ARM  Result Value Ref Range Status   Specimen Description BLOOD RIGHT ARM  Final   Special Requests   Final    BOTTLES DRAWN AEROBIC AND ANAEROBIC Blood Culture adequate volume   Culture   Final  NO GROWTH 4 DAYS Performed at [facility], [address], [place], [place] [address]    Report Status PENDING  Incomplete     Labs: BNP (last 3 results) No results for input(s): BNP in the last 8760 hours. Basic Metabolic  Panel: Recent Labs  Lab 08/31/20 1644 09/01/20 0533 09/02/20 0459  NA 140 139 138  K 4.0 3.9 3.1*  CL 107 107 107  CO2 GLUCOSE 128* 110* 92  BUN 25* 19 18  CREATININE 1.22 0.98 0.82  CALCIUM 9.1 8.8* 8.5*  MG  --  2.1  --   PHOS  --  2.9  --    Liver Function Tests: Recent Labs  Lab 08/31/20 1644 09/01/20 0533 09/02/20 0459  AST 130* 173* 159*  ALT 143* 141* 115*  ALKPHOS 869* 866* 688*  BILITOT 1.1 1.4* 1.0  PROT 7.5 7.4 6.5  ALBUMIN 3.3* 3.5 2.9*   No results for input(s): LIPASE, AMYLASE in the last 168 hours. Recent Labs  Lab 08/31/20 2301  AMMONIA 44*   CBC: Recent Labs  Lab 08/31/20 1644 09/01/20 0533 09/02/20 0459  WBC 10.3 8.3 6.1  NEUTROABS 9.2*  --   --   HGB 12.8* 14.0 12.3*  HCT 39.2 43.5 37.7*  MCV 89.9 91.2 88.9  PLT 121* 111* 121*   Cardiac Enzymes: No results for input(s): CKTOTAL, CKMB, CKMBINDEX, TROPONINI in the last 168 hours. BNP: Invalid input(s): POCBNP CBG: Recent Labs  Lab 09/01/20 1150  GLUCAP 96   D-Dimer No results for input(s): DDIMER in the last 72 hours. Hgb A1c No results for input(s): HGBA1C in the last 72 hours. Lipid Profile No results for input(s): CHOL, HDL, LDLCALC, TRIG, CHOLHDL, LDLDIRECT in the last 72 hours. Thyroid function studies No results for input(s): TSH, T4TOTAL, T3FREE, THYROIDAB in the last 72 hours.  Invalid input(s): FREET3 Anemia work up No results for input(s): VITAMINB12, FOLATE, FERRITIN, TIBC, IRON, RETICCTPCT in the last 72 hours. Urinalysis    Component Value Date/Time   COLORURINE AMBER (A) 08/31/2020 1645   APPEARANCEUR HAZY (A) 08/31/2020 1645   LABSPEC 1.018 08/31/2020 1645   PHURINE 5.0 08/31/2020 1645   GLUCOSEU NEGATIVE 08/31/2020 1645   HGBUR MODERATE (A) 08/31/2020 1645   BILIRUBINUR NEGATIVE 08/31/2020 1645   KETONESUR NEGATIVE 08/31/2020 1645   PROTEINUR 100 (A) 08/31/2020 1645   NITRITE NEGATIVE 08/31/2020 1645   LEUKOCYTESUR NEGATIVE 08/31/2020 1645    Sepsis Labs Invalid input(s): PROCALCITONIN,  WBC,  LACTICIDVEN Microbiology Recent Results (from the past 240 hour(s))  Blood Cultures (routine x 2)     Status: Abnormal   Collection Time: 08/31/20  4:45 PM   Specimen: BLOOD RIGHT FOREARM  Result Value Ref Range Status   Specimen Description   Final    BLOOD RIGHT FOREARM Performed at [facility], [address] [place], [place] [address]    Special Requests   Final    BOTTLES DRAWN AEROBIC AND ANAEROBIC Blood Culture adequate volume Performed at [facility], [address], [place], [place] [address]    Culture  Setup Time   Final    GRAM POSITIVE RODS AEROBIC BOTTLE ONLY Gram Stain Report Called to,Read Back By and Verified With: DOSS,MICHAEL RN  09/01/20 BY JONES,T [facility] Performed at [facility], [address] [place], [place] [address]    Culture (A)  Final    BACILLUS SPECIES Standardized susceptibility testing for this organism is not available. Performed at [facility], [address] [place], [place] [address]    Report Status 09/04/2020 FINAL  Final  Resp Panel by RT-PCR (Flu A&B, Covid) Nasopharyngeal Swab     Status: None   Collection Time: 08/31/20  6:13 PM   Specimen: Nasopharyngeal Swab; Nasopharyngeal(NP) swabs in vial transport medium  Result Value Ref Range Status   SARS Coronavirus 2 by RT PCR NEGATIVE NEGATIVE Final    Comment: (NOTE) SARS-CoV-2 target nucleic acids are NOT DETECTED.  The SARS-CoV-2 RNA is generally detectable in upper respiratory specimens during the acute phase of infection. The lowest concentration of SARS-CoV-2 viral copies this assay can detect is 138 copies/mL. A negative result does not preclude SARS-Cov-2 infection and should not be used as the sole basis for treatment or other patient management decisions. A negative result may occur with  improper specimen collection/handling, submission of specimen other than nasopharyngeal swab, presence of viral mutation(s)  within the areas targeted by this assay, and inadequate number of viral copies(<138 copies/mL). A negative result must be combined with clinical observations, patient history, and epidemiological information. The expected result is Negative.  Fact Sheet for Patients:  BloggerCourse.com  Fact Sheet for Healthcare Providers:  SeriousBroker.it  This test is no t yet approved or cleared by the [place] FDA and  has been authorized for detection and/or diagnosis of SARS-CoV-2 by FDA under an Emergency Use Authorization (EUA). This EUA will remain  in effect (meaning this test can be used) for the duration of the COVID-19 declaration under Section 564(b)(1) of the Act, 21 U.S.C.section 360bbb-3(b)(1), unless the authorization is terminated  or revoked sooner.       Influenza A by PCR NEGATIVE NEGATIVE Final   Influenza B by PCR NEGATIVE NEGATIVE Final    Comment: (NOTE) The Xpert Xpress SARS-CoV-2/FLU/RSV plus assay is intended as an aid in the diagnosis of influenza from Nasopharyngeal swab specimens and should not be used as a sole basis for treatment. Nasal washings and aspirates are unacceptable for Xpert Xpress SARS-CoV-2/FLU/RSV testing.  Fact Sheet for Patients: BloggerCourse.com  Fact Sheet for Healthcare Providers: SeriousBroker.it  This test is not yet approved or cleared by the [place] FDA and has been authorized for detection and/or diagnosis of SARS-CoV-2 by FDA under an Emergency Use Authorization (EUA). This EUA will remain in effect (meaning this test can be used) for the duration of the COVID-19 declaration under Section 564(b)(1) of the Act, 21 U.S.C. section 360bbb-3(b)(1), unless the authorization is terminated or revoked.  Performed at [facility], [address] [place], [place] [address]   Blood Cultures (routine x 2)     Status: None  (Preliminary result)   Collection Time: 08/31/20  9:23 PM   Specimen: BLOOD RIGHT ARM  Result Value Ref Range Status   Specimen Description BLOOD RIGHT ARM  Final   Special Requests   Final    BOTTLES DRAWN AEROBIC AND ANAEROBIC Blood Culture adequate volume   Culture   Final    NO GROWTH 4 DAYS Performed at [facility], [address] [place], [place] [address]    Report Status PENDING  Incomplete     Time coordinating discharge: 35 minutes  SIGNED:   Dorcas Carrow, MD  [facility] 09/04/2020, 9:31 AM

## 2020-09-04 NOTE — Progress Notes (Signed)
Nsg Discharge Note  Admit Date:  08/31/2020 Discharge date: 09/04/2020   Charles Morgan to be D/C'd Home per MD order.  AVS completed.  Copy for chart, and copy for patient signed, and dated. Removed IV-clean, dry, intact. Tried to call report to [facility] 3 times with no success. [facility] picked up patient to transport to [place]. Patient/caregiver able to verbalize understanding.  Discharge Medication: Allergies as of 09/04/2020      Reactions   Tuberculin Rash      Medication List    TAKE these medications   acetaminophen 325 MG tablet Commonly known as: TYLENOL Take 325 mg by mouth every 6 (six) hours as needed.   ammonium lactate 12 % lotion Commonly known as: LAC-HYDRIN Apply 1 application topically as needed for dry skin.   aspirin EC 81 MG tablet Take 81 mg by mouth daily. Swallow whole.   atorvastatin 80 MG tablet Commonly known as: LIPITOR Take 1 tablet (80 mg total) by mouth daily.   Brinzolamide-Brimonidine 1-0.2 % Susp Place 1 drop into both eyes in the morning and at bedtime.   docusate sodium 100 MG capsule Commonly known as: COLACE Take 100 mg by mouth 2 (two) times daily.   lactulose 10 GM/15ML solution Commonly known as: CHRONULAC Take 30 mLs (20 g total) by mouth 2 (two) times daily as needed for mild constipation or moderate constipation.       Discharge Assessment: Vitals:   09/03/20 2037 09/04/20 0411  BP:  (!) 150/83  Pulse:  (!) 50  Resp:  16  Temp:  (!) 97.5 F (36.4 C)  SpO2: 98% 99%   Skin clean, dry and intact without evidence of skin break down, no evidence of skin tears noted. IV catheter discontinued intact. Site without signs and symptoms of complications - no redness or edema noted at insertion site, patient denies c/o pain - only slight tenderness at site.  Dressing with slight pressure applied.  D/c Instructions-Education: Discharge instructions given to patient/family with verbalized understanding. D/c education completed with  patient/family including follow up instructions, medication list, d/c activities limitations if indicated, with other d/c instructions as indicated by MD - patient able to verbalize understanding, all questions fully answered. Patient instructed to return to ED, call 911, or call MD for any changes in condition.  Patient escorted via WC, and D/C home via private auto.  Karolee Ohs, RN 09/04/2020 3:54 PM

## 2020-09-04 NOTE — Progress Notes (Signed)
Tried to call report to [facility] for the 3rd time. I stayed on hold for 10 minutes.

## 2020-09-04 NOTE — Care Management Important Message (Signed)
Important Message  Patient Details  Name: Charles Morgan MRN: [id number] Date of Birth: 01/11/1930   Medicare Important Message Given:  Yes     [name] 09/04/2020, 1:52 PM

## 2020-09-04 NOTE — Progress Notes (Signed)
Physical Therapy Treatment Patient Details Name: Charles Morgan MRN: [id number] DOB: Nov 04, 1929 Today's Date: 09/04/2020    History of Present Illness Charles Morgan is a 86 y.o. male with medical history significant for vertebrobasilar insufficiency, multiple episodes of syncope, h/o CVAs, autonomic dysfunction, right lower extremity DVT and hyperlipidemia who presents to the emergency department via EMS due to altered mental status.  Patient states that he slept on the floor yesterday (though, he does not know why) and that he remembered vomiting, but he was too weak to get up.  This morning, daughter found him slumped over on the floor with noted vomit around him, he did complain of abdominal pain at that time and arouses to voice. Code stroke called due to pt RUE ataxia, MRI and CT negative for acute infarct.     PT Comments    Patient able to complete bed mobility without assist today but continues to have limited use of right hand throughout today's session. Patient demonstrates good sitting balance and sitting tolerance today. He requires assist to transfer to standing without AD and is very unsteady upon standing. Patient continues to require assist to transfer to standing with RW and requires assist for placing R hand on RW. He continues to demonstrate impaired balance with RW requiring assist. Patient ambulates with very slow, shuffled steps today. Patient with intermittent bouts of LE trembling requring max assist to remain standing. Upon return to room, patient's BLE begin trembling and unable to maintain standing independently requireing max assist from PT and RN and assist from NT to get patient to sitting in chair. Patient will benefit from continued physical therapy in hospital and recommended venue below to increase strength, balance, endurance for safe ADLs and gait.     Follow Up Recommendations  SNF;Supervision for mobility/OOB;Supervision - Intermittent     Equipment  Recommendations  Cane    Recommendations for Other Services       Precautions / Restrictions Precautions Precautions: Fall Restrictions Weight Bearing Restrictions: No    Mobility  Bed Mobility Overal bed mobility: Modified Independent             General bed mobility comments: increased time   Transfers Overall transfer level: Needs assistance Equipment used: None;Rolling walker (2 wheeled) Transfers: Sit to/from [employer] Sit to Stand: Mod assist Stand pivot transfers: Mod assist       General transfer comment: required repeated attempts to complete sit to stands, very unsteady labored movement, initially completes transfer to standing without AD, very unsteady upon standing; completes 2nd transfer to standing with RW with L hand on walker and R hand placed on RW but unable to grip due to flacid R hand  Ambulation/Gait Ambulation/Gait assistance: Mod assist;Max assist Gait Distance (Feet): 50 Feet Assistive device: Rolling walker (2 wheeled) Gait Pattern/deviations: Decreased step length - right;Decreased step length - left;Decreased stride length;Narrow base of support;Ataxic;Drifts right/left;Step-to pattern;Shuffle Gait velocity: decreased   General Gait Details: ambulates with slow, shuffled cadence initially with steps being a few inches at a time; stride improves after about 10 feet; Patient requires mod assist with ambulation and requires frequent replacement of R hand on RW; Patient with intermittent bouts of LE trembling requring max assist to remain standing. Upon return to room patient's BLE begin trembling and unable to maintain standing independently requireing max assist from PT and RN and assist from NT to get patient to sitting in chair   Stairs  Wheelchair Mobility    Modified Rankin (Stroke Patients Only)       Balance Overall balance assessment: Needs assistance Sitting-balance support: Feet supported;No  upper extremity supported Sitting balance-Leahy Scale: Good Sitting balance - Comments: seated at EOB   Standing balance support: During functional activity;Single extremity supported Standing balance-Leahy Scale: Poor Standing balance comment: poor/zero with RW                            Cognition Arousal/Alertness: Awake/alert Behavior During Therapy: WFL for tasks assessed/performed Overall Cognitive Status: Within Functional Limits for tasks assessed                                        Exercises      General Comments        Pertinent Vitals/Pain Pain Assessment: No/denies pain    Home Living                      Prior Function            PT Goals (current goals can now be found in the care plan section) Acute Rehab PT Goals Patient Stated Goal: return home after rehab PT Goal Formulation: With patient Time For Goal Achievement: 09/16/20 Potential to Achieve Goals: Good Progress towards PT goals: Progressing toward goals    Frequency    Min 3X/week      PT Plan Current plan remains appropriate    Co-evaluation              AM-PAC PT "6 Clicks" Mobility   Outcome Measure  Help needed turning from your back to your side while in a flat bed without using bedrails?: None Help needed moving from lying on your back to sitting on the side of a flat bed without using bedrails?: A Little Help needed moving to and from a bed to a chair (including a wheelchair)?: A Lot Help needed standing up from a chair using your arms (e.g., wheelchair or bedside chair)?: A Lot Help needed to walk in hospital room?: A Lot Help needed climbing 3-5 steps with a railing? : Total 6 Click Score: 14    End of Session Equipment Utilized During Treatment: Gait belt Activity Tolerance: Patient tolerated treatment well;Patient limited by fatigue Patient left: in chair;with call bell/phone within reach;with nursing/sitter in room Nurse  Communication: Mobility status PT Visit Diagnosis: Unsteadiness on feet (R26.81);Muscle weakness (generalized) (M62.81);Other abnormalities of gait and mobility (R26.89);History of falling (Z91.81)     Time: 3149-7026 PT Time Calculation (min) (ACUTE ONLY): 24 min  Charges:  $Therapeutic Activity: 23-37 mins                     12:32 PM, 09/04/20 Wyman Songster PT, DPT Physical Therapist at [facility]

## 2020-09-04 NOTE — Plan of Care (Signed)
  Problem: Education: Goal: Knowledge of General Education information will improve Description: Including pain rating scale, medication(s)/side effects and non-pharmacologic comfort measures Outcome: Progressing   Problem: Health Behavior/Discharge Planning: Goal: Ability to manage health-related needs will improve Outcome: Progressing   Problem: Education: Goal: Knowledge of disease or condition will improve Outcome: Progressing Goal: Knowledge of secondary prevention will improve Outcome: Progressing   Problem: Coping: Goal: Will verbalize positive feelings about self Outcome: Progressing   Problem: Self-Care: Goal: Ability to participate in self-care as condition permits will improve Outcome: Progressing   Problem: Ischemic Stroke/TIA Tissue Perfusion: Goal: Complications of ischemic stroke/TIA will be minimized Outcome: Progressing

## 2020-09-04 NOTE — TOC Transition Note (Signed)
Transition of Care [facility]) - CM/SW Discharge Note  Patient Details  Name: Charles Morgan MRN: [id number] Date of Birth: 1930-02-24  Transition of Care [facility]) CM/SW Contact:  Ewing Schlein, LCSW Phone Number: 09/04/2020, 1:45 PM  Clinical Narrative: CSW received call from [place] with [facility] that insurance authorization was approved. Patient will go to room B23 bed 1. COVID test was negative. EMS scheduled; medical necessity form was completed and provided to RN. CSW notified Kym Groom with DSS that patient is discharging today. TOC signing off.  Final next level of care: Skilled Nursing Facility Barriers to Discharge: Barriers Resolved  Patient Goals and CMS Choice Patient states their goals for this hospitalization and ongoing recovery are:: Go to rehab CMS Medicare.gov Compare Post Acute Care list provided to:: Patient Choice offered to / list presented to : Patient  Discharge Placement PASRR number recieved: 09/02/20         Patient chooses bed at: [facility] Patient to be transferred to facility by: [facility] Patient and family notified of of transfer: 09/04/20  Discharge Plan and Services In-house Referral: Clinical Social Work Discharge Planning Services: NA Post Acute Care Choice: Skilled Nursing Facility          DME Arranged: N/A DME Agency: NA HH Arranged: NA HH Agency: NA  Readmission Risk Interventions No flowsheet data found.

## 2020-09-04 NOTE — Plan of Care (Signed)
  Problem: Education: Goal: Knowledge of General Education information will improve Description Including pain rating scale, medication(s)/side effects and non-pharmacologic comfort measures Outcome: Progressing   Problem: Health Behavior/Discharge Planning: Goal: Ability to manage health-related needs will improve Outcome: Progressing   

## 2020-09-05 LAB — CULTURE, BLOOD (ROUTINE X 2)
Culture: NO GROWTH
Special Requests: ADEQUATE

## 2020-09-07 DIAGNOSIS — E785 Hyperlipidemia, unspecified: Secondary | ICD-10-CM | POA: Diagnosis not present

## 2020-09-07 DIAGNOSIS — K5900 Constipation, unspecified: Secondary | ICD-10-CM | POA: Diagnosis not present

## 2020-09-07 DIAGNOSIS — S4491XD Injury of unspecified nerve at shoulder and upper arm level, right arm, subsequent encounter: Secondary | ICD-10-CM | POA: Diagnosis not present

## 2020-09-07 DIAGNOSIS — R7989 Other specified abnormal findings of blood chemistry: Secondary | ICD-10-CM | POA: Diagnosis not present

## 2020-09-07 DIAGNOSIS — G9340 Encephalopathy, unspecified: Secondary | ICD-10-CM | POA: Diagnosis not present

## 2020-09-08 DIAGNOSIS — G9340 Encephalopathy, unspecified: Secondary | ICD-10-CM | POA: Diagnosis not present

## 2020-09-28 ENCOUNTER — Ambulatory Visit: Payer: Medicare HMO | Admitting: Podiatry

## 2020-10-06 ENCOUNTER — Other Ambulatory Visit: Payer: Self-pay

## 2020-10-06 ENCOUNTER — Ambulatory Visit (INDEPENDENT_AMBULATORY_CARE_PROVIDER_SITE_OTHER): Payer: Medicare HMO | Admitting: Podiatry

## 2020-10-06 DIAGNOSIS — L97524 Non-pressure chronic ulcer of other part of left foot with necrosis of bone: Secondary | ICD-10-CM | POA: Diagnosis not present

## 2020-10-06 DIAGNOSIS — M2042 Other hammer toe(s) (acquired), left foot: Secondary | ICD-10-CM

## 2020-10-06 DIAGNOSIS — I739 Peripheral vascular disease, unspecified: Secondary | ICD-10-CM

## 2020-10-06 MED ORDER — MUPIROCIN 2 % EX OINT: 1.0000 "application " | TOPICAL_OINTMENT | Freq: Two times a day (BID) | CUTANEOUS | 2 refills | Status: DC

## 2020-10-06 NOTE — Patient Instructions (Addendum)
Vascular testing referral has been sent to:  [facility] at [facility] [address] [place],  [place]  [address]  Main: [phone]  Fax: [phone]   Alternatively this can be done at [facility]  [facility] at [facility] [address] [place],  [place]  [address] Get Driving Directions Main: [phone]   Apply mupirocin ointment daily to the wound with sterile bandage

## 2020-10-07 ENCOUNTER — Encounter: Payer: Self-pay | Admitting: Podiatry

## 2020-10-07 NOTE — Progress Notes (Signed)
  Subjective:  Patient ID: Charles Morgan, male    DOB: [birth year],  MRN: [id number]  Chief Complaint  Patient presents with  . Callouses    Left foot 2nd toe callus     87 y.o. male presents with the above complaint. History confirmed with patient.  He is currently in long-term care and is in hospital at [facility].  He presents with a caretaker with him.  The right second toe is painful.  Objective:  Physical Exam: Onycholysis is noted.  Nonpalpable pedal pulses.  Foot is cool to touch.  On the right foot he has severe hallux valgus and hammertoe formation with dislocating second MTPJ.  On the dorsal second toe there is a callus which upon debridement reveals a 1.0 cm x 0.5 cm ulceration which probes directly down to the PIPJ with exposed head of the proximal phalanx.  No purulence or malodor.  Assessment:   1. 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 Hammertoe of left foot   3. Peripheral arterial disease (HCC)      Plan:  Patient was evaluated and treated and all questions answered.  He is an ulceration of the second MTPJ with exposure of bone and joint.  He likely has osteomyelitis.  I think amputation would be the best course of treatment.  Currently he does not have palpable pulses.  I was unable to Doppler a reliable DP pulse and his PT was monophasic.  Recommend noninvasive vascular testing and vascular surgery consultation.  ABIs and ultrasound ordered and referral was placed.  In the interim dressed daily with mupirocin ointment which was prescribed.  Ulcer right second toe -Debridement as below. -Dressed with Iodosorb, DSD. -Continue off-loading with surgical shoe.  Procedure: Excisional Debridement of Wound Rationale: Removal of non-viable soft tissue from the wound to promote healing.  Anesthesia: none Pre-Debridement Wound Measurements: Unmeasurable due to callus Post-Debridement Wound Measurements: 1.0 cm x 0.5 cm x 0.3 to bone cm   Type of Debridement: Sharp Excisional Tissue Removed: Non-viable soft tissue Depth of Debridement: subcutaneous tissue. Technique: Sharp excisional debridement to bleeding, viable wound base.  Dressing: Dry, sterile, compression dressing. Disposition: Patient tolerated procedure well.   Return in about 3 weeks (around 10/27/2020).       Return in about 3 weeks (around 10/27/2020).

## 2020-10-12 DIAGNOSIS — G9340 Encephalopathy, unspecified: Secondary | ICD-10-CM | POA: Diagnosis not present

## 2020-10-12 DIAGNOSIS — K5900 Constipation, unspecified: Secondary | ICD-10-CM | POA: Diagnosis not present

## 2020-10-12 DIAGNOSIS — E785 Hyperlipidemia, unspecified: Secondary | ICD-10-CM | POA: Diagnosis not present

## 2020-10-12 DIAGNOSIS — S4491XD Injury of unspecified nerve at shoulder and upper arm level, right arm, subsequent encounter: Secondary | ICD-10-CM | POA: Diagnosis not present

## 2020-10-14 ENCOUNTER — Other Ambulatory Visit: Payer: Self-pay

## 2020-10-14 DIAGNOSIS — L98499 Non-pressure chronic ulcer of skin of other sites with unspecified severity: Secondary | ICD-10-CM

## 2020-10-14 DIAGNOSIS — M19031 Primary osteoarthritis, right wrist: Secondary | ICD-10-CM | POA: Diagnosis not present

## 2020-10-15 DIAGNOSIS — S4491XD Injury of unspecified nerve at shoulder and upper arm level, right arm, subsequent encounter: Secondary | ICD-10-CM | POA: Diagnosis not present

## 2020-10-15 DIAGNOSIS — S4490XA Injury of unspecified nerve at shoulder and upper arm level, unspecified arm, initial encounter: Secondary | ICD-10-CM | POA: Diagnosis not present

## 2020-10-15 DIAGNOSIS — R2689 Other abnormalities of gait and mobility: Secondary | ICD-10-CM | POA: Diagnosis not present

## 2020-10-15 DIAGNOSIS — G9341 Metabolic encephalopathy: Secondary | ICD-10-CM | POA: Diagnosis not present

## 2020-10-15 DIAGNOSIS — K5900 Constipation, unspecified: Secondary | ICD-10-CM | POA: Diagnosis not present

## 2020-10-15 DIAGNOSIS — M6281 Muscle weakness (generalized): Secondary | ICD-10-CM | POA: Diagnosis not present

## 2020-10-15 DIAGNOSIS — M25531 Pain in right wrist: Secondary | ICD-10-CM | POA: Diagnosis not present

## 2020-10-15 DIAGNOSIS — G9340 Encephalopathy, unspecified: Secondary | ICD-10-CM | POA: Diagnosis not present

## 2020-10-15 DIAGNOSIS — R7989 Other specified abnormal findings of blood chemistry: Secondary | ICD-10-CM | POA: Diagnosis not present

## 2020-10-15 DIAGNOSIS — M792 Neuralgia and neuritis, unspecified: Secondary | ICD-10-CM | POA: Diagnosis not present

## 2020-10-15 DIAGNOSIS — I6990 Unspecified sequelae of unspecified cerebrovascular disease: Secondary | ICD-10-CM | POA: Diagnosis not present

## 2020-10-15 DIAGNOSIS — E785 Hyperlipidemia, unspecified: Secondary | ICD-10-CM | POA: Diagnosis not present

## 2020-10-16 ENCOUNTER — Other Ambulatory Visit: Payer: Self-pay

## 2020-10-16 ENCOUNTER — Ambulatory Visit (HOSPITAL_COMMUNITY)
Admission: RE | Admit: 2020-10-16 | Discharge: 2020-10-16 | Disposition: A | Discharge status: Home / Self Care | Payer: Medicare HMO | Source: Ambulatory Visit | Attending: Vascular Surgery | Admitting: Vascular Surgery

## 2020-10-16 ENCOUNTER — Ambulatory Visit (INDEPENDENT_AMBULATORY_CARE_PROVIDER_SITE_OTHER): Payer: Medicare HMO | Admitting: Vascular Surgery

## 2020-10-16 ENCOUNTER — Encounter: Payer: Self-pay | Admitting: Vascular Surgery

## 2020-10-16 VITALS — BP 122/66 | HR 50 | Temp 97.9°F | Resp 20 | Ht 66.0 in | Wt 140.0 lb

## 2020-10-16 DIAGNOSIS — I739 Peripheral vascular disease, unspecified: Secondary | ICD-10-CM

## 2020-10-16 DIAGNOSIS — L98499 Non-pressure chronic ulcer of skin of other sites with unspecified severity: Secondary | ICD-10-CM | POA: Diagnosis not present

## 2020-10-16 NOTE — H&P (View-Only) (Signed)
Patient ID: Charles Morgan, male   DOB: 07/24/1930, 87 y.o.   MRN: [id number]  Reason for Consult: New Patient (Initial Visit)   Referred by Edwin Cap, DPM  Subjective:     HPI:  Charles Morgan is a 87 y.o. male without significant vascular disease.  Does have a history of a DVT.  He does walk.  He has a ulcer on his left second toe has been considered for amputation due to underlying osteomyelitis.  He does not have any active cellulitis or erythema.  He denies any pain.  This is being followed by Dr. Dorinda Hill closely with podiatry.  No recent fevers, chills.  Past Medical History:  Diagnosis Date  . Aortic stenosis   . Arthritis   . Bradycardia, sinus 2021   Nocturnal bradycardia, not clearly symptomatic or associated with syncope  . CVA (cerebral vascular accident) [facility])    Two events: 2013, 2019  . DVT (deep venous thrombosis) (HCC) Aug '20   right LE  . HLD (hyperlipidemia)   . NSVT (nonsustained ventricular tachycardia) (HCC)    LVEF normal, not associated with syncope  . Proctocolitis without complication Aug '21   CT diagnosis  . Syncope and collapse    Multiple recurrences - vertebrobasilar insufficiency, autonomic dysfunction, ILR placed by cardiologist in [place]  . Thalamic hemorrhage [facility]) May '21   MRI - diagnosed as chronic. Neurology eval - no need for further w/u  . Vertebrobasilar artery insufficiency    Family History  Family history unknown: Yes   Past Surgical History:  Procedure Laterality Date  . ABDOMINAL SURGERY    . FLEXIBLE SIGMOIDOSCOPY N/A 06/05/2020   Procedure: FLEXIBLE SIGMOIDOSCOPY;  Surgeon: Malissa Hippo, MD;  [facility];  Service: Endoscopy;  Laterality: N/A;  . HEMORRHOIDECTOMY WITH HEMORRHOID BANDING    . LOOP RECORDER IMPLANT    . SHOULDER SURGERY    . Thumb surgery      Short Social History:  Social History   Tobacco Use  . Smoking status: Never Smoker  . Smokeless tobacco: Never Used  Substance Use  Topics  . Alcohol use: Never    Allergies  Allergen Reactions  . Tuberculin Rash    Current Outpatient Medications  Medication Sig Dispense Refill  . acetaminophen (TYLENOL) 325 MG tablet Take 325 mg by mouth every 6 (six) hours as needed.     Marland Kitchen ammonium lactate (LAC-HYDRIN) 12 % lotion Apply 1 application topically as needed for dry skin.     Marland Kitchen aspirin EC 81 MG tablet Take 81 mg by mouth daily. Swallow whole.    Marland Kitchen atorvastatin (LIPITOR) 80 MG tablet Take 1 tablet (80 mg total) by mouth daily.    . Brinzolamide-Brimonidine 1-0.2 % SUSP Place 1 drop into both eyes in the morning and at bedtime.     . docusate sodium (COLACE) 100 MG capsule Take 100 mg by mouth 2 (two) times daily.    Marland Kitchen lactulose (CHRONULAC) 10 GM/15ML solution Take 30 mLs (20 g total) by mouth 2 (two) times daily as needed for mild constipation or moderate constipation. 236 mL 0  . mupirocin ointment (BACTROBAN) 2 % Apply 1 application topically 2 (two) times daily. 30 g 2   No current facility-administered medications for this visit.    Review of Systems  Constitutional:  Constitutional negative. HENT: HENT negative.  Eyes: Eyes negative.  Cardiovascular: Cardiovascular negative.  GI: Gastrointestinal negative.  Musculoskeletal: Musculoskeletal negative.  Skin: Positive for wound.  Neurological: Neurological  negative. Hematologic: Hematologic/lymphatic negative.  Psychiatric: Psychiatric negative.        Objective:  Objective   Vitals:   10/16/20 1158  BP: 122/66  Pulse: (!) 50  Resp: 20  Temp: 97.9 F (36.6 C)  SpO2: 97%  Weight: 140 lb (63.5 kg)  Height: 5' 6" (1.676 m)   Body mass index is 22.6 kg/m.  Physical Exam HENT:     Head: Normocephalic.     Nose:     Comments: Wearing a mask Eyes:     Pupils: Pupils are equal, round, and reactive to light.  Cardiovascular:     Pulses:          Femoral pulses are 2+ on the right side and 2+ on the left side.      Popliteal pulses are 1+ on  the right side and 1+ on the left side.       Dorsalis pedis pulses are 0 on the right side and 0 on the left side.       Posterior tibial pulses are 0 on the right side and 0 on the left side.     Comments: Difficult to detect his dorsalis pedis and posterior tibial arteries with Doppler Pulmonary:     Effort: Pulmonary effort is normal.  Abdominal:     General: Abdomen is flat.     Palpations: Abdomen is soft. There is no mass.  Musculoskeletal:        General: Normal range of motion.     Comments: Left second toe with dorsal ulceration  Skin:    Capillary Refill: Capillary refill takes 2 to 3 seconds.  Neurological:     Mental Status: He is alert.     Motor: Weakness present.  Psychiatric:        Mood and Affect: Mood normal.        Behavior: Behavior normal.        Thought Content: Thought content normal.        Judgment: Judgment normal.     Data: ABI Summary:  Right:  Inaudible/ possibly absent pedal waveforms, unable to obtain ABIs.  Abnormal TBI.  Left:  Resting left ankle-brachial index indicates mild left lower  extremity arterial disease. The left toe-brachial index is abnormal. ABIs  are unreliable.      Assessment/Plan:     89-year-old male with left second toe ulceration.  ABIs suggest mild disease in the left lower extremity although monophasic waveforms suggest more severe tibial disease given palpable popliteal pulses bilaterally.  We will plan for angiogram from a right common femoral approach.  This will be done on a Monday in the near future and can be as an outpatient.  I discussed the risk and benefits with the patient he demonstrates good understanding.  He also understands he will likely require second toe amputation in the future.     Shawntay Prest Christopher Annajulia Lewing MD [facility]   

## 2020-10-16 NOTE — Progress Notes (Signed)
Patient ID: Gardy Montanari, male   DOB: 07/24/1930, 87 y.o.   MRN: [id number]  Reason for Consult: New Patient (Initial Visit)   Referred by Edwin Cap, DPM  Subjective:     HPI:  [name] is a 87 y.o. male without significant vascular disease.  Does have a history of a DVT.  He does walk.  He has a ulcer on his left second toe has been considered for amputation due to underlying osteomyelitis.  He does not have any active cellulitis or erythema.  He denies any pain.  This is being followed by Dr. Dorinda Hill closely with podiatry.  No recent fevers, chills.  Past Medical History:  Diagnosis Date  . Aortic stenosis   . Arthritis   . Bradycardia, sinus 2021   Nocturnal bradycardia, not clearly symptomatic or associated with syncope  . CVA (cerebral vascular accident) [facility])    Two events: 2013, 2019  . DVT (deep venous thrombosis) (HCC) Aug '20   right LE  . HLD (hyperlipidemia)   . NSVT (nonsustained ventricular tachycardia) (HCC)    LVEF normal, not associated with syncope  . Proctocolitis without complication Aug '21   CT diagnosis  . Syncope and collapse    Multiple recurrences - vertebrobasilar insufficiency, autonomic dysfunction, ILR placed by cardiologist in [place]  . Thalamic hemorrhage [facility]) May '21   MRI - diagnosed as chronic. Neurology eval - no need for further w/u  . Vertebrobasilar artery insufficiency    Family History  Family history unknown: Yes   Past Surgical History:  Procedure Laterality Date  . ABDOMINAL SURGERY    . FLEXIBLE SIGMOIDOSCOPY N/A 06/05/2020   Procedure: FLEXIBLE SIGMOIDOSCOPY;  Surgeon: Malissa Hippo, MD;  Location: [facility];  Service: Endoscopy;  Laterality: N/A;  . HEMORRHOIDECTOMY WITH HEMORRHOID BANDING    . LOOP RECORDER IMPLANT    . SHOULDER SURGERY    . Thumb surgery      Short Social History:  Social History   Tobacco Use  . Smoking status: Never Smoker  . Smokeless tobacco: Never Used  Substance Use  Topics  . Alcohol use: Never    Allergies  Allergen Reactions  . Tuberculin Rash    Current Outpatient Medications  Medication Sig Dispense Refill  . acetaminophen (TYLENOL) 325 MG tablet Take 325 mg by mouth every 6 (six) hours as needed.     Marland Kitchen ammonium lactate (LAC-HYDRIN) 12 % lotion Apply 1 application topically as needed for dry skin.     Marland Kitchen aspirin EC 81 MG tablet Take 81 mg by mouth daily. Swallow whole.    Marland Kitchen atorvastatin (LIPITOR) 80 MG tablet Take 1 tablet (80 mg total) by mouth daily.    . Brinzolamide-Brimonidine 1-0.2 % SUSP Place 1 drop into both eyes in the morning and at bedtime.     . docusate sodium (COLACE) 100 MG capsule Take 100 mg by mouth 2 (two) times daily.    Marland Kitchen lactulose (CHRONULAC) 10 GM/15ML solution Take 30 mLs (20 g total) by mouth 2 (two) times daily as needed for mild constipation or moderate constipation. 236 mL 0  . mupirocin ointment (BACTROBAN) 2 % Apply 1 application topically 2 (two) times daily. 30 g 2   No current facility-administered medications for this visit.    Review of Systems  Constitutional:  Constitutional negative. HENT: HENT negative.  Eyes: Eyes negative.  Cardiovascular: Cardiovascular negative.  GI: Gastrointestinal negative.  Musculoskeletal: Musculoskeletal negative.  Skin: Positive for wound.  Neurological: Neurological  negative. Hematologic: Hematologic/lymphatic negative.  Psychiatric: Psychiatric negative.        Objective:  Objective   Vitals:   10/16/20 1158  BP: 122/66  Pulse: (!) 50  Resp: 20  Temp: 97.9 F (36.6 C)  SpO2: 97%  Weight: 140 lb (63.5 kg)  Height: 5\' 6"  (1.676 m)   Body mass index is 22.6 kg/m.  Physical Exam HENT:     Head: Normocephalic.     Nose:     Comments: Wearing a mask Eyes:     Pupils: Pupils are equal, round, and reactive to light.  Cardiovascular:     Pulses:          Femoral pulses are 2+ on the right side and 2+ on the left side.      Popliteal pulses are 1+ on  the right side and 1+ on the left side.       Dorsalis pedis pulses are 0 on the right side and 0 on the left side.       Posterior tibial pulses are 0 on the right side and 0 on the left side.     Comments: Difficult to detect his dorsalis pedis and posterior tibial arteries with Doppler Pulmonary:     Effort: Pulmonary effort is normal.  Abdominal:     General: Abdomen is flat.     Palpations: Abdomen is soft. There is no mass.  Musculoskeletal:        General: Normal range of motion.     Comments: Left second toe with dorsal ulceration  Skin:    Capillary Refill: Capillary refill takes 2 to 3 seconds.  Neurological:     Mental Status: He is alert.     Motor: Weakness present.  Psychiatric:        Mood and Affect: Mood normal.        Behavior: Behavior normal.        Thought Content: Thought content normal.        Judgment: Judgment normal.     Data: ABI Summary:  Right:  Inaudible/ possibly absent pedal waveforms, unable to obtain ABIs.  Abnormal TBI.  Left:  Resting left ankle-brachial index indicates mild left lower  extremity arterial disease. The left toe-brachial index is abnormal. ABIs  are unreliable.      Assessment/Plan:     87 year old male with left second toe ulceration.  ABIs suggest mild disease in the left lower extremity although monophasic waveforms suggest more severe tibial disease given palpable popliteal pulses bilaterally.  We will plan for angiogram from a right common femoral approach.  This will be done on a Monday in the near future and can be as an outpatient.  I discussed the risk and benefits with the patient he demonstrates good understanding.  He also understands he will likely require second toe amputation in the future.     Sunday MD [facility]

## 2020-10-20 DIAGNOSIS — I6990 Unspecified sequelae of unspecified cerebrovascular disease: Secondary | ICD-10-CM | POA: Diagnosis not present

## 2020-10-20 DIAGNOSIS — R2689 Other abnormalities of gait and mobility: Secondary | ICD-10-CM | POA: Diagnosis not present

## 2020-10-20 DIAGNOSIS — G9341 Metabolic encephalopathy: Secondary | ICD-10-CM | POA: Diagnosis not present

## 2020-10-20 DIAGNOSIS — S4490XA Injury of unspecified nerve at shoulder and upper arm level, unspecified arm, initial encounter: Secondary | ICD-10-CM | POA: Diagnosis not present

## 2020-10-20 DIAGNOSIS — M792 Neuralgia and neuritis, unspecified: Secondary | ICD-10-CM | POA: Diagnosis not present

## 2020-10-20 DIAGNOSIS — M6281 Muscle weakness (generalized): Secondary | ICD-10-CM | POA: Diagnosis not present

## 2020-10-21 DIAGNOSIS — S4490XA Injury of unspecified nerve at shoulder and upper arm level, unspecified arm, initial encounter: Secondary | ICD-10-CM | POA: Diagnosis not present

## 2020-10-21 DIAGNOSIS — I6990 Unspecified sequelae of unspecified cerebrovascular disease: Secondary | ICD-10-CM | POA: Diagnosis not present

## 2020-10-21 DIAGNOSIS — R2689 Other abnormalities of gait and mobility: Secondary | ICD-10-CM | POA: Diagnosis not present

## 2020-10-21 DIAGNOSIS — M792 Neuralgia and neuritis, unspecified: Secondary | ICD-10-CM | POA: Diagnosis not present

## 2020-10-21 DIAGNOSIS — M6281 Muscle weakness (generalized): Secondary | ICD-10-CM | POA: Diagnosis not present

## 2020-10-21 DIAGNOSIS — G9341 Metabolic encephalopathy: Secondary | ICD-10-CM | POA: Diagnosis not present

## 2020-10-22 DIAGNOSIS — M6281 Muscle weakness (generalized): Secondary | ICD-10-CM | POA: Diagnosis not present

## 2020-10-22 DIAGNOSIS — R2689 Other abnormalities of gait and mobility: Secondary | ICD-10-CM | POA: Diagnosis not present

## 2020-10-22 DIAGNOSIS — I6990 Unspecified sequelae of unspecified cerebrovascular disease: Secondary | ICD-10-CM | POA: Diagnosis not present

## 2020-10-22 DIAGNOSIS — G9341 Metabolic encephalopathy: Secondary | ICD-10-CM | POA: Diagnosis not present

## 2020-10-22 DIAGNOSIS — S4490XA Injury of unspecified nerve at shoulder and upper arm level, unspecified arm, initial encounter: Secondary | ICD-10-CM | POA: Diagnosis not present

## 2020-10-22 DIAGNOSIS — M792 Neuralgia and neuritis, unspecified: Secondary | ICD-10-CM | POA: Diagnosis not present

## 2020-10-23 DIAGNOSIS — G9341 Metabolic encephalopathy: Secondary | ICD-10-CM | POA: Diagnosis not present

## 2020-10-23 DIAGNOSIS — M792 Neuralgia and neuritis, unspecified: Secondary | ICD-10-CM | POA: Diagnosis not present

## 2020-10-23 DIAGNOSIS — S4490XA Injury of unspecified nerve at shoulder and upper arm level, unspecified arm, initial encounter: Secondary | ICD-10-CM | POA: Diagnosis not present

## 2020-10-23 DIAGNOSIS — M6281 Muscle weakness (generalized): Secondary | ICD-10-CM | POA: Diagnosis not present

## 2020-10-23 DIAGNOSIS — R2689 Other abnormalities of gait and mobility: Secondary | ICD-10-CM | POA: Diagnosis not present

## 2020-10-23 DIAGNOSIS — I6990 Unspecified sequelae of unspecified cerebrovascular disease: Secondary | ICD-10-CM | POA: Diagnosis not present

## 2020-10-26 DIAGNOSIS — R2689 Other abnormalities of gait and mobility: Secondary | ICD-10-CM | POA: Diagnosis not present

## 2020-10-26 DIAGNOSIS — M792 Neuralgia and neuritis, unspecified: Secondary | ICD-10-CM | POA: Diagnosis not present

## 2020-10-26 DIAGNOSIS — G9341 Metabolic encephalopathy: Secondary | ICD-10-CM | POA: Diagnosis not present

## 2020-10-26 DIAGNOSIS — I6990 Unspecified sequelae of unspecified cerebrovascular disease: Secondary | ICD-10-CM | POA: Diagnosis not present

## 2020-10-26 DIAGNOSIS — S4490XA Injury of unspecified nerve at shoulder and upper arm level, unspecified arm, initial encounter: Secondary | ICD-10-CM | POA: Diagnosis not present

## 2020-10-26 DIAGNOSIS — M6281 Muscle weakness (generalized): Secondary | ICD-10-CM | POA: Diagnosis not present

## 2020-10-27 ENCOUNTER — Ambulatory Visit: Payer: Medicare HMO | Admitting: Podiatry

## 2020-10-27 DIAGNOSIS — S4490XA Injury of unspecified nerve at shoulder and upper arm level, unspecified arm, initial encounter: Secondary | ICD-10-CM | POA: Diagnosis not present

## 2020-10-27 DIAGNOSIS — M792 Neuralgia and neuritis, unspecified: Secondary | ICD-10-CM | POA: Diagnosis not present

## 2020-10-27 DIAGNOSIS — G9341 Metabolic encephalopathy: Secondary | ICD-10-CM | POA: Diagnosis not present

## 2020-10-27 DIAGNOSIS — M6281 Muscle weakness (generalized): Secondary | ICD-10-CM | POA: Diagnosis not present

## 2020-10-27 DIAGNOSIS — I6990 Unspecified sequelae of unspecified cerebrovascular disease: Secondary | ICD-10-CM | POA: Diagnosis not present

## 2020-10-27 DIAGNOSIS — R2689 Other abnormalities of gait and mobility: Secondary | ICD-10-CM | POA: Diagnosis not present

## 2020-10-28 DIAGNOSIS — M792 Neuralgia and neuritis, unspecified: Secondary | ICD-10-CM | POA: Diagnosis not present

## 2020-10-28 DIAGNOSIS — M6281 Muscle weakness (generalized): Secondary | ICD-10-CM | POA: Diagnosis not present

## 2020-10-28 DIAGNOSIS — G9341 Metabolic encephalopathy: Secondary | ICD-10-CM | POA: Diagnosis not present

## 2020-10-28 DIAGNOSIS — R2689 Other abnormalities of gait and mobility: Secondary | ICD-10-CM | POA: Diagnosis not present

## 2020-10-28 DIAGNOSIS — S4490XA Injury of unspecified nerve at shoulder and upper arm level, unspecified arm, initial encounter: Secondary | ICD-10-CM | POA: Diagnosis not present

## 2020-10-28 DIAGNOSIS — I6990 Unspecified sequelae of unspecified cerebrovascular disease: Secondary | ICD-10-CM | POA: Diagnosis not present

## 2020-10-29 DIAGNOSIS — I6990 Unspecified sequelae of unspecified cerebrovascular disease: Secondary | ICD-10-CM | POA: Diagnosis not present

## 2020-10-29 DIAGNOSIS — M6281 Muscle weakness (generalized): Secondary | ICD-10-CM | POA: Diagnosis not present

## 2020-10-29 DIAGNOSIS — R2689 Other abnormalities of gait and mobility: Secondary | ICD-10-CM | POA: Diagnosis not present

## 2020-10-29 DIAGNOSIS — S4490XA Injury of unspecified nerve at shoulder and upper arm level, unspecified arm, initial encounter: Secondary | ICD-10-CM | POA: Diagnosis not present

## 2020-10-29 DIAGNOSIS — M792 Neuralgia and neuritis, unspecified: Secondary | ICD-10-CM | POA: Diagnosis not present

## 2020-10-29 DIAGNOSIS — G9341 Metabolic encephalopathy: Secondary | ICD-10-CM | POA: Diagnosis not present

## 2020-10-30 DIAGNOSIS — M792 Neuralgia and neuritis, unspecified: Secondary | ICD-10-CM | POA: Diagnosis not present

## 2020-10-30 DIAGNOSIS — R2689 Other abnormalities of gait and mobility: Secondary | ICD-10-CM | POA: Diagnosis not present

## 2020-10-30 DIAGNOSIS — I6990 Unspecified sequelae of unspecified cerebrovascular disease: Secondary | ICD-10-CM | POA: Diagnosis not present

## 2020-10-30 DIAGNOSIS — M6281 Muscle weakness (generalized): Secondary | ICD-10-CM | POA: Diagnosis not present

## 2020-10-30 DIAGNOSIS — S4490XA Injury of unspecified nerve at shoulder and upper arm level, unspecified arm, initial encounter: Secondary | ICD-10-CM | POA: Diagnosis not present

## 2020-10-30 DIAGNOSIS — G9341 Metabolic encephalopathy: Secondary | ICD-10-CM | POA: Diagnosis not present

## 2020-11-02 ENCOUNTER — Encounter (HOSPITAL_COMMUNITY)
Admission: RE | Disposition: A | Discharge status: Home / Self Care | Payer: Self-pay | Source: Home / Self Care | Attending: Vascular Surgery

## 2020-11-02 ENCOUNTER — Ambulatory Visit (HOSPITAL_COMMUNITY)
Admission: RE | Admit: 2020-11-02 | Discharge: 2020-11-02 | Disposition: A | Discharge status: Home / Self Care | Payer: Medicare HMO | Attending: Vascular Surgery | Admitting: Vascular Surgery

## 2020-11-02 ENCOUNTER — Other Ambulatory Visit: Payer: Self-pay

## 2020-11-02 DIAGNOSIS — Z20822 Contact with and (suspected) exposure to covid-19: Secondary | ICD-10-CM | POA: Insufficient documentation

## 2020-11-02 DIAGNOSIS — I70245 Atherosclerosis of native arteries of left leg with ulceration of other part of foot: Secondary | ICD-10-CM | POA: Diagnosis not present

## 2020-11-02 DIAGNOSIS — Z7982 Long term (current) use of aspirin: Secondary | ICD-10-CM | POA: Insufficient documentation

## 2020-11-02 DIAGNOSIS — Z79899 Other long term (current) drug therapy: Secondary | ICD-10-CM | POA: Insufficient documentation

## 2020-11-02 HISTORY — PX: ABDOMINAL AORTOGRAM W/LOWER EXTREMITY: CATH118223

## 2020-11-02 HISTORY — PX: PERIPHERAL VASCULAR ATHERECTOMY: CATH118256

## 2020-11-02 LAB — POCT I-STAT, CHEM 8
BUN: 12 mg/dL (ref 8–23)
Calcium, Ion: 1.24 mmol/L (ref 1.15–1.40)
Chloride: 105 mmol/L (ref 98–111)
Creatinine, Ser: 0.8 mg/dL (ref 0.61–1.24)
Glucose, Bld: 83 mg/dL (ref 70–99)
HCT: 38 % — ABNORMAL LOW (ref 39.0–52.0)
Hemoglobin: 12.9 g/dL — ABNORMAL LOW (ref 13.0–17.0)
Potassium: 3.9 mmol/L (ref 3.5–5.1)
Sodium: 141 mmol/L (ref 135–145)
TCO2: 24 mmol/L (ref 22–32)

## 2020-11-02 LAB — SARS CORONAVIRUS 2 BY RT PCR (HOSPITAL ORDER, PERFORMED IN ~~LOC~~ HOSPITAL LAB): SARS Coronavirus 2: NEGATIVE

## 2020-11-02 SURGERY — ABDOMINAL AORTOGRAM W/LOWER EXTREMITY
Anesthesia: LOCAL

## 2020-11-02 MED ORDER — LIDOCAINE HCL (PF) 1 % IJ SOLN
INTRAMUSCULAR | Status: DC | PRN
  Administered 2020-11-02 (×2): 15 mL

## 2020-11-02 MED ORDER — LABETALOL HCL 5 MG/ML IV SOLN: 10.0000 mg | INTRAVENOUS | Status: DC | PRN

## 2020-11-02 MED ORDER — SODIUM CHLORIDE 0.9 % IV SOLN: 250.0000 mL | INTRAVENOUS | Status: DC | PRN

## 2020-11-02 MED ORDER — CLOPIDOGREL BISULFATE 300 MG PO TABS
ORAL_TABLET | ORAL | Status: DC | PRN
  Administered 2020-11-02: 300 mg via ORAL

## 2020-11-02 MED ORDER — HEPARIN (PORCINE) IN NACL 1000-0.9 UT/500ML-% IV SOLN
INTRAVENOUS | Status: DC | PRN
  Administered 2020-11-02: 500 mL

## 2020-11-02 MED ORDER — LIDOCAINE HCL (PF) 1 % IJ SOLN
INTRAMUSCULAR | Status: AC
  Filled 2020-11-02: qty 30

## 2020-11-02 MED ORDER — SODIUM CHLORIDE 0.9% FLUSH: 3.0000 mL | Freq: Two times a day (BID) | INTRAVENOUS | Status: DC

## 2020-11-02 MED ORDER — HEPARIN SODIUM (PORCINE) 1000 UNIT/ML IJ SOLN
INTRAMUSCULAR | Status: AC
  Filled 2020-11-02: qty 1

## 2020-11-02 MED ORDER — HEPARIN (PORCINE) IN NACL 1000-0.9 UT/500ML-% IV SOLN
INTRAVENOUS | Status: AC
  Filled 2020-11-02: qty 1000

## 2020-11-02 MED ORDER — ONDANSETRON HCL 4 MG/2ML IJ SOLN: 4.0000 mg | Freq: Four times a day (QID) | INTRAMUSCULAR | Status: DC | PRN

## 2020-11-02 MED ORDER — HEPARIN SODIUM (PORCINE) 1000 UNIT/ML IJ SOLN
INTRAMUSCULAR | Status: DC | PRN
  Administered 2020-11-02: 6000 [IU] via INTRAVENOUS

## 2020-11-02 MED ORDER — OXYCODONE HCL 5 MG PO TABS: 5.0000 mg | ORAL_TABLET | ORAL | Status: DC | PRN

## 2020-11-02 MED ORDER — CLOPIDOGREL BISULFATE 300 MG PO TABS
ORAL_TABLET | ORAL | Status: AC
  Filled 2020-11-02: qty 1

## 2020-11-02 MED ORDER — SODIUM CHLORIDE 0.9 % WEIGHT BASED INFUSION: 1.0000 mL/kg/h | INTRAVENOUS | Status: DC

## 2020-11-02 MED ORDER — HYDRALAZINE HCL 20 MG/ML IJ SOLN
5.0000 mg | INTRAMUSCULAR | Status: DC | PRN
Start: 2020-11-02 — End: 2020-11-02

## 2020-11-02 MED ORDER — CLOPIDOGREL BISULFATE 75 MG PO TABS: 75.0000 mg | ORAL_TABLET | Freq: Every day | ORAL | Status: DC

## 2020-11-02 MED ORDER — IODIXANOL 320 MG/ML IV SOLN
INTRAVENOUS | Status: DC | PRN
  Administered 2020-11-02: 160 mL

## 2020-11-02 MED ORDER — SODIUM CHLORIDE 0.9% FLUSH: 3.0000 mL | INTRAVENOUS | Status: DC | PRN

## 2020-11-02 MED ORDER — CLOPIDOGREL BISULFATE 75 MG PO TABS: 300.0000 mg | ORAL_TABLET | Freq: Once | ORAL | Status: AC

## 2020-11-02 MED ORDER — CLOPIDOGREL BISULFATE 75 MG PO TABS: 75.0000 mg | ORAL_TABLET | Freq: Every day | ORAL | 11 refills | Status: DC

## 2020-11-02 MED ORDER — ACETAMINOPHEN 325 MG PO TABS: 650.0000 mg | ORAL_TABLET | ORAL | Status: DC | PRN

## 2020-11-02 MED ORDER — SODIUM CHLORIDE 0.9 % IV SOLN: INTRAVENOUS | Status: DC

## 2020-11-02 SURGICAL SUPPLY — 23 items
BALLN COYOTE OTW 2.5X220X150 (BALLOONS) ×3
BALLOON COYOTE OTW 2.5X220X150 (BALLOONS) ×2 IMPLANT
CATH AURYON 4FR ATHEREC 0.9 (CATHETERS) ×3 IMPLANT
CATH OMNI FLUSH 5F 65CM (CATHETERS) ×3 IMPLANT
CATH QUICKCROSS .018X135CM (MICROCATHETER) ×3 IMPLANT
CATH QUICKCROSS .035X135CM (MICROCATHETER) ×3 IMPLANT
CLOSURE MYNX CONTROL 5F (Vascular Products) ×3 IMPLANT
CLOSURE MYNX CONTROL 6F/7F (Vascular Products) ×3 IMPLANT
GLIDEWIRE ANGLED SS 035X260CM (WIRE) ×3 IMPLANT
KIT ENCORE 26 ADVANTAGE (KITS) ×3 IMPLANT
KIT MICROPUNCTURE NIT STIFF (SHEATH) ×3 IMPLANT
KIT PV (KITS) ×3 IMPLANT
SHEATH FLEX ANSEL ANG 6F 45CM (SHEATH) ×3 IMPLANT
SHEATH PINNACLE 5F 10CM (SHEATH) ×3 IMPLANT
SHEATH PINNACLE 6F 10CM (SHEATH) ×3 IMPLANT
SHEATH PROBE COVER 6X72 (BAG) ×6 IMPLANT
SYR MEDRAD MARK V 150ML (SYRINGE) ×3 IMPLANT
TRANSDUCER W/STOPCOCK (MISCELLANEOUS) ×3 IMPLANT
TRAY PV CATH (CUSTOM PROCEDURE TRAY) ×3 IMPLANT
WIRE G V18X300CM (WIRE) ×3 IMPLANT
WIRE ROSEN-J .035X180CM (WIRE) ×3 IMPLANT
WIRE SPARTACORE .014X300CM (WIRE) ×3 IMPLANT
WIRE STARTER BENTSON 035X150 (WIRE) ×3 IMPLANT

## 2020-11-02 NOTE — Interval H&P Note (Signed)
History and Physical Interval Note:  11/02/2020 8:21 AM  Charles Morgan  has presented today for surgery, with the diagnosis of peripheral vascular disease.  The various methods of treatment have been discussed with the patient and family. After consideration of risks, benefits and other options for treatment, the patient has consented to  Procedure(s): ABDOMINAL AORTOGRAM W/LOWER EXTREMITY (N/A) as a surgical intervention.  The patient's history has been reviewed, patient examined, no change in status, stable for surgery.  I have reviewed the patient's chart and labs.  Questions were answered to the patient's satisfaction.     [name]

## 2020-11-02 NOTE — Discharge Instructions (Signed)
Angiogram, Care After This sheet gives you information about how to care for yourself after your procedure. Your health care provider may also give you more specific instructions. If you have problems or questions, contact your health care provider. What can I expect after the procedure? After the procedure, it is common to have:  Bruising and tenderness at the catheter insertion area.  A collection of blood (hematoma) at the insertion area. This may feel like a small lump under the skin at the insertion site. Follow these instructions at home: Insertion site care  Follow instructions from your health care provider about how to take care of your insertion site. Make sure you: ? Wash your hands with soap and water before and after you change your bandage (dressing). If soap and water are not available, use hand sanitizer. ? Change your dressing as told by your health care provider.  Do not take baths, swim, or use a hot tub until your health care provider approves.  You may shower 24-48 hours after the procedure, or as told by your health care provider. To clean the insertion site: ? Gently wash the area with plain soap and water. ? Pat the area dry with a clean towel. ? Do not rub the site. This may cause bleeding.  Check your insertion site every day for signs of infection. Check for: ? Redness, swelling, or pain. ? Fluid or blood. ? Warmth. ? Pus or a bad smell.  Do not apply powder or lotion to the site. Keep the site clean and dry.   Activity  Do not drive for 24 hours if you were given a sedative during your procedure.  Rest as told by your health care provider, usually for 1-2 days.  Do not lift anything that is heavier than 10 lb (4.5 kg), or the limit that you are told, until your health care provider says that it is safe.  If the insertion site was in your leg, try to avoid stairs for a few days.  Return to your normal activities as told by your health care provider,  usually in about a week. Ask your health care provider what activities are safe for you. General instructions  If your insertion site starts bleeding, lie flat and put pressure on the site. If the bleeding does not stop, get help right away. This is a medical emergency.  Take over-the-counter and prescription medicines only as told by your health care provider.  Drink enough fluid to keep your urine pale yellow. This helps flush the contrast dye from your body.  Keep all follow-up visits as told by your health care provider. This is important.   Contact a health care provider if:  You have a fever or chills.  You have redness, swelling, or pain around your insertion site.  You have fluid or blood coming from your insertion site.  Your insertion site feels warm to the touch.  You have pus or a bad smell coming from your insertion site.  You have more bruising around the insertion site. Get help right away if you have:  A problem with the insertion area, such as: ? The area swells fast or bleeds even after you apply pressure. ? The area becomes pale, cool, tingly, or numb.  Chest pain.  Trouble breathing.  A rash.  Any symptoms of a stroke. "BE FAST" is an easy way to remember the main warning signs of a stroke: ? B - Balance. Signs are dizziness, sudden trouble walking,   or loss of balance. ? E - Eyes. Signs are trouble seeing or a sudden change in vision. ? F - Face. Signs are sudden weakness or loss of feeling of the face, or the face or eyelid drooping on one side. ? A - Arms. Signs are weakness or loss of feeling in an arm. This happens suddenly and usually on one side of the body. ? S - Speech. Signs are sudden trouble speaking, slurred speech, or trouble understanding what people say. ? T - Time. Time to call emergency services. Write down what time symptoms started.  You have other signs of a stroke, such as: ? A sudden, severe headache with no known cause. ? Nausea  or vomiting. ? Seizure. These symptoms may represent a serious problem that is an emergency. Do not wait to see if the symptoms will go away. Get medical help right away. Call your local emergency services (911 in the [place] Do not drive yourself to the hospital. Summary  It is common to have bruising and tenderness at the catheter insertion area.  Do not take baths, swim, or use a hot tub until your health care provider approves. You may shower 24-48 hours after the procedure or as told.  It is important to rest and drink plenty of fluids.  If the insertion site bleeds, lie flat and put pressure on the site. If the bleeding continues, get help right away. This is a medical emergency. This information is not intended to replace advice given to you by your health care provider. Make sure you discuss any questions you have with your health care provider. Document Revised: 07/17/2019 Document Reviewed: 07/17/2019 Elsevier Patient Education  2021 Elsevier Inc.  

## 2020-11-02 NOTE — Op Note (Signed)
Patient name: Charles Morgan MRN: [id number] DOB: 08/04/1930 Sex: male  11/02/2020 Pre-operative Diagnosis: Critical left lower extremity ischemia with second toe ulceration Post-operative diagnosis:  Same Surgeon:  Luanna Salk. Randie Heinz, MD Procedure Performed: 1.  Ultrasound-guided cannulation right common femoral artery 2.  Aortogram with bilateral lower extremity runoff 3.  Ultrasound-guided cannulation of left common femoral artery in antegrade position 4.  Laser arthrectomy left anterior tibial artery with 0.9 mm [place]on 5.  Plain balloon angioplasty left anterior tibial artery with 2.5 mm balloon 6.  Bilateral common femoral artery minx device closure   Indications: 87 year old male without previous vascular history.  He now has ulceration on his left toe with barely audible ABIs bilaterally.  He is indicated for angiography with possible invention.  Findings: His ejection fraction is severely reduced leading to delayed angiography.  Left renal artery has approximately 50% stenosis.  Right kidney has 2 renal arteries the most cephalad which has no stenosis the bottom which appears mostly atretic.  Bilateral common and external iliac arteries are patent however severely tortuous.  Bilateral common femoral arteries measure approximately 1.5 mm that are free of disease.  SFAs are patent.  Popliteal arteries are patent.  The right side the tibials barely fill timing of the contrast was severely delayed.  Left side initially has a posterior tibial artery which then occludes reconstitutes in the mid calf then gives way to the distal peroneal does not have inline flow to the foot.  Peroneal artery is atretic and frankly occludes only reconstitutes very distally.  Intertibial artery initially is patent for several centimeters then diseased for approximately 20 cm with severe stenoses and occlusions.  There is a dorsalis pedis running on the foot.  After intervention there is no flow-limiting stenosis or  dissection in the anterior tibial   Procedure:  The patient was identified in the holding area and taken to room 8.  The patient was then placed supine on the table and prepped and draped in the usual sterile fashion.  A time out was called.  Ultrasound was used to evaluate the right common femoral artery which is large and patent.  The area anesthetized 1% lidocaine cannulated micropuncture needle followed wire and sheath.  Images saved per record.  Bentson wires placed followed 5 French sheath and Omni catheter to the level of L1 and aortogram was performed followed by lower extremity runoff.  With above findings we elected to attempt across the bifurcation.  Given the heavy tortuosity I did attempt a Bentson wire and Glidewire but I could not get over the bifurcation.  With this I then performed closure of the right common femoral artery minx device.  I used ultrasound to cannulate the left common femoral artery.  I demonstrated its patency the area was anesthetized 1% lidocaine cannulated micropuncture needle followed by wire and sheath.  I was able to get the wire into the SFA then placed a Bentson wire followed by a 6 [place] sheath.  I placed a cross catheter at the level of the popliteal artery performed angiography and elected for intervention.  I exchanged for a long 6 French sheath down to the popliteal artery patient was fully heparinized.  I used a quick cross catheter V 18 selected the anterior tibial artery I was able to cross all the way to the level of foot confirmed intraluminal access.  I exchanged for an 014 wire performed laser arthrectomy of the entire anterior tibial artery for approximately 20 cm lesion and then  performed balloon angioplasty at nominal pressure for 2-1/2 minutes.  Completion demonstrated no residual stenosis or dissection.  The wires were removed.  I placed a Rosen wire change for a short 6 French sheath put a minx device.  He tolerated procedure without any  complication.    Contrast: 160cc  Monta Police C. Randie Heinz, MD [facility] [place] Office: [phone] Pager: [phone]

## 2020-11-03 ENCOUNTER — Encounter (HOSPITAL_COMMUNITY): Payer: Self-pay | Admitting: Vascular Surgery

## 2020-11-03 DIAGNOSIS — M792 Neuralgia and neuritis, unspecified: Secondary | ICD-10-CM | POA: Diagnosis not present

## 2020-11-03 DIAGNOSIS — S4490XA Injury of unspecified nerve at shoulder and upper arm level, unspecified arm, initial encounter: Secondary | ICD-10-CM | POA: Diagnosis not present

## 2020-11-03 DIAGNOSIS — G9340 Encephalopathy, unspecified: Secondary | ICD-10-CM | POA: Diagnosis not present

## 2020-11-03 DIAGNOSIS — M6281 Muscle weakness (generalized): Secondary | ICD-10-CM | POA: Diagnosis not present

## 2020-11-03 DIAGNOSIS — I6990 Unspecified sequelae of unspecified cerebrovascular disease: Secondary | ICD-10-CM | POA: Diagnosis not present

## 2020-11-03 DIAGNOSIS — E785 Hyperlipidemia, unspecified: Secondary | ICD-10-CM | POA: Diagnosis not present

## 2020-11-03 DIAGNOSIS — G9341 Metabolic encephalopathy: Secondary | ICD-10-CM | POA: Diagnosis not present

## 2020-11-03 DIAGNOSIS — R2689 Other abnormalities of gait and mobility: Secondary | ICD-10-CM | POA: Diagnosis not present

## 2020-11-03 DIAGNOSIS — K5900 Constipation, unspecified: Secondary | ICD-10-CM | POA: Diagnosis not present

## 2020-11-03 LAB — POCT ACTIVATED CLOTTING TIME: Activated Clotting Time: 225 seconds

## 2020-11-04 DIAGNOSIS — M792 Neuralgia and neuritis, unspecified: Secondary | ICD-10-CM | POA: Diagnosis not present

## 2020-11-04 DIAGNOSIS — I6990 Unspecified sequelae of unspecified cerebrovascular disease: Secondary | ICD-10-CM | POA: Diagnosis not present

## 2020-11-04 DIAGNOSIS — M6281 Muscle weakness (generalized): Secondary | ICD-10-CM | POA: Diagnosis not present

## 2020-11-04 DIAGNOSIS — G9341 Metabolic encephalopathy: Secondary | ICD-10-CM | POA: Diagnosis not present

## 2020-11-04 DIAGNOSIS — R2689 Other abnormalities of gait and mobility: Secondary | ICD-10-CM | POA: Diagnosis not present

## 2020-11-04 DIAGNOSIS — S4490XA Injury of unspecified nerve at shoulder and upper arm level, unspecified arm, initial encounter: Secondary | ICD-10-CM | POA: Diagnosis not present

## 2020-11-05 ENCOUNTER — Encounter: Payer: Self-pay | Admitting: Podiatry

## 2020-11-05 ENCOUNTER — Other Ambulatory Visit: Payer: Self-pay

## 2020-11-05 ENCOUNTER — Ambulatory Visit (INDEPENDENT_AMBULATORY_CARE_PROVIDER_SITE_OTHER): Payer: Medicare HMO

## 2020-11-05 ENCOUNTER — Ambulatory Visit (INDEPENDENT_AMBULATORY_CARE_PROVIDER_SITE_OTHER): Payer: Medicare HMO | Admitting: Podiatry

## 2020-11-05 DIAGNOSIS — L97524 Non-pressure chronic ulcer of other part of left foot with necrosis of bone: Secondary | ICD-10-CM

## 2020-11-05 DIAGNOSIS — I6990 Unspecified sequelae of unspecified cerebrovascular disease: Secondary | ICD-10-CM | POA: Diagnosis not present

## 2020-11-05 DIAGNOSIS — M792 Neuralgia and neuritis, unspecified: Secondary | ICD-10-CM | POA: Diagnosis not present

## 2020-11-05 DIAGNOSIS — S4490XA Injury of unspecified nerve at shoulder and upper arm level, unspecified arm, initial encounter: Secondary | ICD-10-CM | POA: Diagnosis not present

## 2020-11-05 DIAGNOSIS — M2042 Other hammer toe(s) (acquired), left foot: Secondary | ICD-10-CM | POA: Diagnosis not present

## 2020-11-05 DIAGNOSIS — R2689 Other abnormalities of gait and mobility: Secondary | ICD-10-CM | POA: Diagnosis not present

## 2020-11-05 DIAGNOSIS — I739 Peripheral vascular disease, unspecified: Secondary | ICD-10-CM

## 2020-11-05 DIAGNOSIS — G9341 Metabolic encephalopathy: Secondary | ICD-10-CM | POA: Diagnosis not present

## 2020-11-05 DIAGNOSIS — M6281 Muscle weakness (generalized): Secondary | ICD-10-CM | POA: Diagnosis not present

## 2020-11-05 NOTE — Progress Notes (Signed)
  Subjective:  Patient ID: Charles Morgan, male    DOB: 07/07/1930,  MRN: [id number]  Chief Complaint  Patient presents with  . Foot Ulcer    Left foot 2nd toe skin ulcer follow up. PT stated that he is doing okay and he only has a little bit of pain with that toe    87 y.o. male returns with the above complaint. History confirmed with patient.  He is now status post left lower extremity angiography and revascularization with Dr. Randie Heinz  Objective:  Physical Exam: Onycholysis is noted.  Nonpalpable pedal pulses.  Foot is cool to touch.  On the left foot he has severe hallux valgus and hammertoe formation with dislocating second MTPJ.  On the dorsal second toe there is a callus which upon debridement reveals a 1.0 cm x 0.5 cm ulceration which probes directly down to the PIPJ with exposed head of the proximal phalanx.  No purulence or malodor.   X-rays left foot were taken and reveal a severe hammertoe deformity with dislocation of the PIPJ and osteolysis of the head of the proximal phalanx Assessment:   1. 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 Hammertoe of left foot   3. Peripheral arterial disease (HCC)      Plan:  Patient was evaluated and treated and all questions answered.  Reviewed the course of treatment with the patient.  We discussed continued local wound care versus proceeding with amputation.  I recommend we proceed with amputation to eliminate any risk of persistent infection as well as limiting the deformity.  I do not think his wound will heal without removing the hammertoe deformity.  I discussed this with Dr. Randie Heinz who feels that he has sufficient blood flow to heal the toe amputation at this point.  We discussed all risk, benefits and potential complications including nonhealing of ulceration and further risk of limb loss.  He is not diabetic.  He does not smoke currently.  Informed consent was signed and reviewed.  The patient is right-handed  and was not able to sign and asked me to sign on his behalf which I did and my nurse had witnessed this.  Surgery be scheduled for outpatient surgery toe amputation in 3 weeks.   Surgical plan:  Procedure: -Second toe amputation left foot  Location: -[facility]  Anesthesia plan: -IV sedation light with local anesthesia  Postoperative pain plan: - Tylenol 1000 mg every 6 hours, ibuprofen 600 mg every 6 hours,oxycodone 5 mg 1-2 tabs every 6 hours only as needed  DVT prophylaxis: -None required  WB Restrictions / DME needs: -Surgical shoe dispensed today   No follow-ups on file.

## 2020-11-05 NOTE — Progress Notes (Signed)
Pt is right handed and has had a stroke and could not sign his surgery packet, He had Dr.Mcdonald sign for him and was okay with that and I witnessed.  Myriam Jacobson LPN

## 2020-11-06 ENCOUNTER — Telehealth: Payer: Self-pay | Admitting: Podiatry

## 2020-11-06 DIAGNOSIS — G9341 Metabolic encephalopathy: Secondary | ICD-10-CM | POA: Diagnosis not present

## 2020-11-06 DIAGNOSIS — M6281 Muscle weakness (generalized): Secondary | ICD-10-CM | POA: Diagnosis not present

## 2020-11-06 DIAGNOSIS — R2689 Other abnormalities of gait and mobility: Secondary | ICD-10-CM | POA: Diagnosis not present

## 2020-11-06 DIAGNOSIS — M792 Neuralgia and neuritis, unspecified: Secondary | ICD-10-CM | POA: Diagnosis not present

## 2020-11-06 DIAGNOSIS — I6990 Unspecified sequelae of unspecified cerebrovascular disease: Secondary | ICD-10-CM | POA: Diagnosis not present

## 2020-11-06 DIAGNOSIS — S4490XA Injury of unspecified nerve at shoulder and upper arm level, unspecified arm, initial encounter: Secondary | ICD-10-CM | POA: Diagnosis not present

## 2020-11-06 NOTE — Telephone Encounter (Signed)
Do patient need continuation of treatment with Bactroban to the 2nd toe since patient is scheduled for amputation on March 4, Please Advise  Wound Nurse Contact Info  [phone]

## 2020-11-09 ENCOUNTER — Telehealth: Payer: Self-pay | Admitting: Podiatry

## 2020-11-09 DIAGNOSIS — R2689 Other abnormalities of gait and mobility: Secondary | ICD-10-CM | POA: Diagnosis not present

## 2020-11-09 DIAGNOSIS — G9341 Metabolic encephalopathy: Secondary | ICD-10-CM | POA: Diagnosis not present

## 2020-11-09 DIAGNOSIS — M6281 Muscle weakness (generalized): Secondary | ICD-10-CM | POA: Diagnosis not present

## 2020-11-09 DIAGNOSIS — S4490XA Injury of unspecified nerve at shoulder and upper arm level, unspecified arm, initial encounter: Secondary | ICD-10-CM | POA: Diagnosis not present

## 2020-11-09 DIAGNOSIS — I6990 Unspecified sequelae of unspecified cerebrovascular disease: Secondary | ICD-10-CM | POA: Diagnosis not present

## 2020-11-09 DIAGNOSIS — M792 Neuralgia and neuritis, unspecified: Secondary | ICD-10-CM | POA: Diagnosis not present

## 2020-11-09 NOTE — Telephone Encounter (Signed)
The facility wanted to know if they needed to discontinue the treatment of the Bactroban to the 2nd toe.

## 2020-11-10 ENCOUNTER — Telehealth: Payer: Self-pay | Admitting: *Deleted

## 2020-11-10 DIAGNOSIS — M792 Neuralgia and neuritis, unspecified: Secondary | ICD-10-CM | POA: Diagnosis not present

## 2020-11-10 DIAGNOSIS — R2689 Other abnormalities of gait and mobility: Secondary | ICD-10-CM | POA: Diagnosis not present

## 2020-11-10 DIAGNOSIS — G9341 Metabolic encephalopathy: Secondary | ICD-10-CM | POA: Diagnosis not present

## 2020-11-10 DIAGNOSIS — I6990 Unspecified sequelae of unspecified cerebrovascular disease: Secondary | ICD-10-CM | POA: Diagnosis not present

## 2020-11-10 DIAGNOSIS — S4490XA Injury of unspecified nerve at shoulder and upper arm level, unspecified arm, initial encounter: Secondary | ICD-10-CM | POA: Diagnosis not present

## 2020-11-10 DIAGNOSIS — M6281 Muscle weakness (generalized): Secondary | ICD-10-CM | POA: Diagnosis not present

## 2020-11-10 NOTE — Telephone Encounter (Signed)
Called [facility] and left message for Zenett(wound care nurse) that she is to continue with mupirocin ointment per Dr Vara Guardian instructions.

## 2020-11-12 DIAGNOSIS — M792 Neuralgia and neuritis, unspecified: Secondary | ICD-10-CM | POA: Diagnosis not present

## 2020-11-12 DIAGNOSIS — G9341 Metabolic encephalopathy: Secondary | ICD-10-CM | POA: Diagnosis not present

## 2020-11-12 DIAGNOSIS — I6990 Unspecified sequelae of unspecified cerebrovascular disease: Secondary | ICD-10-CM | POA: Diagnosis not present

## 2020-11-12 DIAGNOSIS — R2689 Other abnormalities of gait and mobility: Secondary | ICD-10-CM | POA: Diagnosis not present

## 2020-11-12 DIAGNOSIS — S4490XA Injury of unspecified nerve at shoulder and upper arm level, unspecified arm, initial encounter: Secondary | ICD-10-CM | POA: Diagnosis not present

## 2020-11-12 DIAGNOSIS — M6281 Muscle weakness (generalized): Secondary | ICD-10-CM | POA: Diagnosis not present

## 2020-11-13 DIAGNOSIS — S4490XA Injury of unspecified nerve at shoulder and upper arm level, unspecified arm, initial encounter: Secondary | ICD-10-CM | POA: Diagnosis not present

## 2020-11-13 DIAGNOSIS — G9341 Metabolic encephalopathy: Secondary | ICD-10-CM | POA: Diagnosis not present

## 2020-11-13 DIAGNOSIS — M6281 Muscle weakness (generalized): Secondary | ICD-10-CM | POA: Diagnosis not present

## 2020-11-13 DIAGNOSIS — M792 Neuralgia and neuritis, unspecified: Secondary | ICD-10-CM | POA: Diagnosis not present

## 2020-11-13 DIAGNOSIS — R2689 Other abnormalities of gait and mobility: Secondary | ICD-10-CM | POA: Diagnosis not present

## 2020-11-13 DIAGNOSIS — I6990 Unspecified sequelae of unspecified cerebrovascular disease: Secondary | ICD-10-CM | POA: Diagnosis not present

## 2020-11-15 DIAGNOSIS — S4490XA Injury of unspecified nerve at shoulder and upper arm level, unspecified arm, initial encounter: Secondary | ICD-10-CM | POA: Diagnosis not present

## 2020-11-15 DIAGNOSIS — R2689 Other abnormalities of gait and mobility: Secondary | ICD-10-CM | POA: Diagnosis not present

## 2020-11-15 DIAGNOSIS — M6281 Muscle weakness (generalized): Secondary | ICD-10-CM | POA: Diagnosis not present

## 2020-11-15 DIAGNOSIS — I6990 Unspecified sequelae of unspecified cerebrovascular disease: Secondary | ICD-10-CM | POA: Diagnosis not present

## 2020-11-15 DIAGNOSIS — M792 Neuralgia and neuritis, unspecified: Secondary | ICD-10-CM | POA: Diagnosis not present

## 2020-11-15 DIAGNOSIS — G9341 Metabolic encephalopathy: Secondary | ICD-10-CM | POA: Diagnosis not present

## 2020-11-16 DIAGNOSIS — M6281 Muscle weakness (generalized): Secondary | ICD-10-CM | POA: Diagnosis not present

## 2020-11-16 DIAGNOSIS — G9341 Metabolic encephalopathy: Secondary | ICD-10-CM | POA: Diagnosis not present

## 2020-11-16 DIAGNOSIS — M792 Neuralgia and neuritis, unspecified: Secondary | ICD-10-CM | POA: Diagnosis not present

## 2020-11-16 DIAGNOSIS — I6990 Unspecified sequelae of unspecified cerebrovascular disease: Secondary | ICD-10-CM | POA: Diagnosis not present

## 2020-11-16 DIAGNOSIS — R2689 Other abnormalities of gait and mobility: Secondary | ICD-10-CM | POA: Diagnosis not present

## 2020-11-16 DIAGNOSIS — S4490XA Injury of unspecified nerve at shoulder and upper arm level, unspecified arm, initial encounter: Secondary | ICD-10-CM | POA: Diagnosis not present

## 2020-11-17 ENCOUNTER — Telehealth: Payer: Self-pay

## 2020-11-17 DIAGNOSIS — M6281 Muscle weakness (generalized): Secondary | ICD-10-CM | POA: Diagnosis not present

## 2020-11-17 DIAGNOSIS — S4490XA Injury of unspecified nerve at shoulder and upper arm level, unspecified arm, initial encounter: Secondary | ICD-10-CM | POA: Diagnosis not present

## 2020-11-17 DIAGNOSIS — R2689 Other abnormalities of gait and mobility: Secondary | ICD-10-CM | POA: Diagnosis not present

## 2020-11-17 DIAGNOSIS — I6990 Unspecified sequelae of unspecified cerebrovascular disease: Secondary | ICD-10-CM | POA: Diagnosis not present

## 2020-11-17 DIAGNOSIS — M792 Neuralgia and neuritis, unspecified: Secondary | ICD-10-CM | POA: Diagnosis not present

## 2020-11-17 DIAGNOSIS — G9341 Metabolic encephalopathy: Secondary | ICD-10-CM | POA: Diagnosis not present

## 2020-11-17 NOTE — Telephone Encounter (Signed)
DOS 11/27/2020  AMPUTATION TOE MPJ JOINT 2ND LT - [id number]  [employer]  The following codes do not require a pre-authorization Created on 11/17/2020  Service info [id number] Amputation, toe; metatarsophalangeal joint

## 2020-11-18 DIAGNOSIS — M6281 Muscle weakness (generalized): Secondary | ICD-10-CM | POA: Diagnosis not present

## 2020-11-18 DIAGNOSIS — R2689 Other abnormalities of gait and mobility: Secondary | ICD-10-CM | POA: Diagnosis not present

## 2020-11-18 DIAGNOSIS — S4490XA Injury of unspecified nerve at shoulder and upper arm level, unspecified arm, initial encounter: Secondary | ICD-10-CM | POA: Diagnosis not present

## 2020-11-18 DIAGNOSIS — G9341 Metabolic encephalopathy: Secondary | ICD-10-CM | POA: Diagnosis not present

## 2020-11-18 DIAGNOSIS — M792 Neuralgia and neuritis, unspecified: Secondary | ICD-10-CM | POA: Diagnosis not present

## 2020-11-18 DIAGNOSIS — I6990 Unspecified sequelae of unspecified cerebrovascular disease: Secondary | ICD-10-CM | POA: Diagnosis not present

## 2020-11-19 DIAGNOSIS — I6990 Unspecified sequelae of unspecified cerebrovascular disease: Secondary | ICD-10-CM | POA: Diagnosis not present

## 2020-11-19 DIAGNOSIS — S4490XA Injury of unspecified nerve at shoulder and upper arm level, unspecified arm, initial encounter: Secondary | ICD-10-CM | POA: Diagnosis not present

## 2020-11-19 DIAGNOSIS — M6281 Muscle weakness (generalized): Secondary | ICD-10-CM | POA: Diagnosis not present

## 2020-11-19 DIAGNOSIS — G9341 Metabolic encephalopathy: Secondary | ICD-10-CM | POA: Diagnosis not present

## 2020-11-19 DIAGNOSIS — M792 Neuralgia and neuritis, unspecified: Secondary | ICD-10-CM | POA: Diagnosis not present

## 2020-11-19 DIAGNOSIS — R2689 Other abnormalities of gait and mobility: Secondary | ICD-10-CM | POA: Diagnosis not present

## 2020-11-20 DIAGNOSIS — G9341 Metabolic encephalopathy: Secondary | ICD-10-CM | POA: Diagnosis not present

## 2020-11-20 DIAGNOSIS — M792 Neuralgia and neuritis, unspecified: Secondary | ICD-10-CM | POA: Diagnosis not present

## 2020-11-20 DIAGNOSIS — I6990 Unspecified sequelae of unspecified cerebrovascular disease: Secondary | ICD-10-CM | POA: Diagnosis not present

## 2020-11-20 DIAGNOSIS — R2689 Other abnormalities of gait and mobility: Secondary | ICD-10-CM | POA: Diagnosis not present

## 2020-11-20 DIAGNOSIS — M6281 Muscle weakness (generalized): Secondary | ICD-10-CM | POA: Diagnosis not present

## 2020-11-20 DIAGNOSIS — S4490XA Injury of unspecified nerve at shoulder and upper arm level, unspecified arm, initial encounter: Secondary | ICD-10-CM | POA: Diagnosis not present

## 2020-11-21 DIAGNOSIS — I6990 Unspecified sequelae of unspecified cerebrovascular disease: Secondary | ICD-10-CM | POA: Diagnosis not present

## 2020-11-21 DIAGNOSIS — G9341 Metabolic encephalopathy: Secondary | ICD-10-CM | POA: Diagnosis not present

## 2020-11-21 DIAGNOSIS — M792 Neuralgia and neuritis, unspecified: Secondary | ICD-10-CM | POA: Diagnosis not present

## 2020-11-21 DIAGNOSIS — R2689 Other abnormalities of gait and mobility: Secondary | ICD-10-CM | POA: Diagnosis not present

## 2020-11-21 DIAGNOSIS — S4490XA Injury of unspecified nerve at shoulder and upper arm level, unspecified arm, initial encounter: Secondary | ICD-10-CM | POA: Diagnosis not present

## 2020-11-21 DIAGNOSIS — M6281 Muscle weakness (generalized): Secondary | ICD-10-CM | POA: Diagnosis not present

## 2020-11-23 ENCOUNTER — Telehealth: Payer: Self-pay | Admitting: Podiatry

## 2020-11-23 DIAGNOSIS — R2689 Other abnormalities of gait and mobility: Secondary | ICD-10-CM | POA: Diagnosis not present

## 2020-11-23 DIAGNOSIS — M6281 Muscle weakness (generalized): Secondary | ICD-10-CM | POA: Diagnosis not present

## 2020-11-23 DIAGNOSIS — G9341 Metabolic encephalopathy: Secondary | ICD-10-CM | POA: Diagnosis not present

## 2020-11-23 DIAGNOSIS — S4490XA Injury of unspecified nerve at shoulder and upper arm level, unspecified arm, initial encounter: Secondary | ICD-10-CM | POA: Diagnosis not present

## 2020-11-23 DIAGNOSIS — I6990 Unspecified sequelae of unspecified cerebrovascular disease: Secondary | ICD-10-CM | POA: Diagnosis not present

## 2020-11-23 DIAGNOSIS — M792 Neuralgia and neuritis, unspecified: Secondary | ICD-10-CM | POA: Diagnosis not present

## 2020-11-23 NOTE — Telephone Encounter (Signed)
Naiomi with [facility] calling to get clarity on patients co pay for surgery. Please advise. Would also like any further instructions on care before surgery.Please call back [phone]

## 2020-11-24 DIAGNOSIS — G9341 Metabolic encephalopathy: Secondary | ICD-10-CM | POA: Diagnosis not present

## 2020-11-24 DIAGNOSIS — R2689 Other abnormalities of gait and mobility: Secondary | ICD-10-CM | POA: Diagnosis not present

## 2020-11-24 DIAGNOSIS — S4490XA Injury of unspecified nerve at shoulder and upper arm level, unspecified arm, initial encounter: Secondary | ICD-10-CM | POA: Diagnosis not present

## 2020-11-24 DIAGNOSIS — G5631 Lesion of radial nerve, right upper limb: Secondary | ICD-10-CM | POA: Diagnosis not present

## 2020-11-24 DIAGNOSIS — M130 Polyarthritis, unspecified: Secondary | ICD-10-CM | POA: Diagnosis not present

## 2020-11-24 DIAGNOSIS — E1142 Type 2 diabetes mellitus with diabetic polyneuropathy: Secondary | ICD-10-CM | POA: Diagnosis not present

## 2020-11-24 DIAGNOSIS — G5603 Carpal tunnel syndrome, bilateral upper limbs: Secondary | ICD-10-CM | POA: Diagnosis not present

## 2020-11-24 DIAGNOSIS — I6990 Unspecified sequelae of unspecified cerebrovascular disease: Secondary | ICD-10-CM | POA: Diagnosis not present

## 2020-11-24 DIAGNOSIS — M6281 Muscle weakness (generalized): Secondary | ICD-10-CM | POA: Diagnosis not present

## 2020-11-24 DIAGNOSIS — M792 Neuralgia and neuritis, unspecified: Secondary | ICD-10-CM | POA: Diagnosis not present

## 2020-11-25 DIAGNOSIS — I6990 Unspecified sequelae of unspecified cerebrovascular disease: Secondary | ICD-10-CM | POA: Diagnosis not present

## 2020-11-25 DIAGNOSIS — R2689 Other abnormalities of gait and mobility: Secondary | ICD-10-CM | POA: Diagnosis not present

## 2020-11-25 DIAGNOSIS — M6281 Muscle weakness (generalized): Secondary | ICD-10-CM | POA: Diagnosis not present

## 2020-11-25 DIAGNOSIS — M792 Neuralgia and neuritis, unspecified: Secondary | ICD-10-CM | POA: Diagnosis not present

## 2020-11-25 DIAGNOSIS — G9341 Metabolic encephalopathy: Secondary | ICD-10-CM | POA: Diagnosis not present

## 2020-11-25 DIAGNOSIS — S4490XA Injury of unspecified nerve at shoulder and upper arm level, unspecified arm, initial encounter: Secondary | ICD-10-CM | POA: Diagnosis not present

## 2020-11-26 DIAGNOSIS — S4490XA Injury of unspecified nerve at shoulder and upper arm level, unspecified arm, initial encounter: Secondary | ICD-10-CM | POA: Diagnosis not present

## 2020-11-26 DIAGNOSIS — M6281 Muscle weakness (generalized): Secondary | ICD-10-CM | POA: Diagnosis not present

## 2020-11-26 DIAGNOSIS — G9341 Metabolic encephalopathy: Secondary | ICD-10-CM | POA: Diagnosis not present

## 2020-11-26 DIAGNOSIS — R2689 Other abnormalities of gait and mobility: Secondary | ICD-10-CM | POA: Diagnosis not present

## 2020-11-26 DIAGNOSIS — M792 Neuralgia and neuritis, unspecified: Secondary | ICD-10-CM | POA: Diagnosis not present

## 2020-11-26 DIAGNOSIS — I6990 Unspecified sequelae of unspecified cerebrovascular disease: Secondary | ICD-10-CM | POA: Diagnosis not present

## 2020-11-26 NOTE — Telephone Encounter (Signed)
Ok thanks 

## 2020-11-26 NOTE — Telephone Encounter (Signed)
Did you have a chance to talk to them about co pay? I assume so since they've not canceled

## 2020-11-26 NOTE — Telephone Encounter (Signed)
Yes, They called me and I have already handled this.  Thanks

## 2020-11-27 ENCOUNTER — Other Ambulatory Visit: Payer: Self-pay | Admitting: Podiatry

## 2020-11-27 ENCOUNTER — Other Ambulatory Visit: Payer: Self-pay

## 2020-11-27 DIAGNOSIS — L209 Atopic dermatitis, unspecified: Secondary | ICD-10-CM | POA: Insufficient documentation

## 2020-11-27 DIAGNOSIS — M86672 Other chronic osteomyelitis, left ankle and foot: Secondary | ICD-10-CM | POA: Diagnosis not present

## 2020-11-27 DIAGNOSIS — Z9181 History of falling: Secondary | ICD-10-CM | POA: Insufficient documentation

## 2020-11-27 DIAGNOSIS — H401190 Primary open-angle glaucoma, unspecified eye, stage unspecified: Secondary | ICD-10-CM | POA: Insufficient documentation

## 2020-11-27 DIAGNOSIS — G3184 Mild cognitive impairment, so stated: Secondary | ICD-10-CM | POA: Insufficient documentation

## 2020-11-27 DIAGNOSIS — I629 Nontraumatic intracranial hemorrhage, unspecified: Secondary | ICD-10-CM | POA: Insufficient documentation

## 2020-11-27 DIAGNOSIS — M25539 Pain in unspecified wrist: Secondary | ICD-10-CM | POA: Insufficient documentation

## 2020-11-27 DIAGNOSIS — M79642 Pain in left hand: Secondary | ICD-10-CM | POA: Insufficient documentation

## 2020-11-27 DIAGNOSIS — M868X7 Other osteomyelitis, ankle and foot: Secondary | ICD-10-CM | POA: Diagnosis not present

## 2020-11-27 DIAGNOSIS — L97524 Non-pressure chronic ulcer of other part of left foot with necrosis of bone: Secondary | ICD-10-CM | POA: Diagnosis not present

## 2020-11-27 DIAGNOSIS — K269 Duodenal ulcer, unspecified as acute or chronic, without hemorrhage or perforation: Secondary | ICD-10-CM | POA: Insufficient documentation

## 2020-11-27 DIAGNOSIS — R1310 Dysphagia, unspecified: Secondary | ICD-10-CM | POA: Insufficient documentation

## 2020-11-27 DIAGNOSIS — M2042 Other hammer toe(s) (acquired), left foot: Secondary | ICD-10-CM | POA: Diagnosis not present

## 2020-11-27 DIAGNOSIS — K219 Gastro-esophageal reflux disease without esophagitis: Secondary | ICD-10-CM | POA: Insufficient documentation

## 2020-11-27 DIAGNOSIS — K1370 Unspecified lesions of oral mucosa: Secondary | ICD-10-CM | POA: Insufficient documentation

## 2020-11-27 DIAGNOSIS — F1010 Alcohol abuse, uncomplicated: Secondary | ICD-10-CM | POA: Insufficient documentation

## 2020-11-27 DIAGNOSIS — N400 Enlarged prostate without lower urinary tract symptoms: Secondary | ICD-10-CM | POA: Insufficient documentation

## 2020-11-27 DIAGNOSIS — H6120 Impacted cerumen, unspecified ear: Secondary | ICD-10-CM | POA: Insufficient documentation

## 2020-11-27 DIAGNOSIS — F1410 Cocaine abuse, uncomplicated: Secondary | ICD-10-CM | POA: Insufficient documentation

## 2020-11-27 DIAGNOSIS — L84 Corns and callosities: Secondary | ICD-10-CM | POA: Insufficient documentation

## 2020-11-27 DIAGNOSIS — M79641 Pain in right hand: Secondary | ICD-10-CM | POA: Insufficient documentation

## 2020-11-27 DIAGNOSIS — R1909 Other intra-abdominal and pelvic swelling, mass and lump: Secondary | ICD-10-CM | POA: Insufficient documentation

## 2020-11-27 DIAGNOSIS — M792 Neuralgia and neuritis, unspecified: Secondary | ICD-10-CM | POA: Insufficient documentation

## 2020-11-27 DIAGNOSIS — J029 Acute pharyngitis, unspecified: Secondary | ICD-10-CM | POA: Insufficient documentation

## 2020-11-27 DIAGNOSIS — K4090 Unilateral inguinal hernia, without obstruction or gangrene, not specified as recurrent: Secondary | ICD-10-CM | POA: Insufficient documentation

## 2020-11-27 DIAGNOSIS — Z5689 Other problems related to employment: Secondary | ICD-10-CM | POA: Insufficient documentation

## 2020-11-27 DIAGNOSIS — R3129 Other microscopic hematuria: Secondary | ICD-10-CM | POA: Insufficient documentation

## 2020-11-27 DIAGNOSIS — K5900 Constipation, unspecified: Secondary | ICD-10-CM | POA: Insufficient documentation

## 2020-11-27 DIAGNOSIS — B351 Tinea unguium: Secondary | ICD-10-CM | POA: Insufficient documentation

## 2020-11-27 DIAGNOSIS — R609 Edema, unspecified: Secondary | ICD-10-CM | POA: Insufficient documentation

## 2020-11-27 DIAGNOSIS — I252 Old myocardial infarction: Secondary | ICD-10-CM | POA: Insufficient documentation

## 2020-11-27 DIAGNOSIS — J309 Allergic rhinitis, unspecified: Secondary | ICD-10-CM | POA: Insufficient documentation

## 2020-11-27 DIAGNOSIS — Z862 Personal history of diseases of the blood and blood-forming organs and certain disorders involving the immune mechanism: Secondary | ICD-10-CM | POA: Insufficient documentation

## 2020-11-27 DIAGNOSIS — R7611 Nonspecific reaction to tuberculin skin test without active tuberculosis: Secondary | ICD-10-CM | POA: Insufficient documentation

## 2020-11-27 DIAGNOSIS — R918 Other nonspecific abnormal finding of lung field: Secondary | ICD-10-CM | POA: Insufficient documentation

## 2020-11-27 DIAGNOSIS — M19049 Primary osteoarthritis, unspecified hand: Secondary | ICD-10-CM | POA: Insufficient documentation

## 2020-11-27 DIAGNOSIS — G9341 Metabolic encephalopathy: Secondary | ICD-10-CM | POA: Insufficient documentation

## 2020-11-27 DIAGNOSIS — Z5900 Homelessness unspecified: Secondary | ICD-10-CM | POA: Insufficient documentation

## 2020-11-27 MED ORDER — OXYCODONE-ACETAMINOPHEN 5-325 MG PO TABS: 1.0000 | ORAL_TABLET | ORAL | 0 refills | Status: DC | PRN

## 2020-11-27 NOTE — Progress Notes (Signed)
11/27/20 [facility] L second toe amputation

## 2020-11-30 DIAGNOSIS — M792 Neuralgia and neuritis, unspecified: Secondary | ICD-10-CM | POA: Diagnosis not present

## 2020-11-30 DIAGNOSIS — R1310 Dysphagia, unspecified: Secondary | ICD-10-CM | POA: Diagnosis not present

## 2020-11-30 DIAGNOSIS — K5900 Constipation, unspecified: Secondary | ICD-10-CM | POA: Diagnosis not present

## 2020-11-30 DIAGNOSIS — G9341 Metabolic encephalopathy: Secondary | ICD-10-CM | POA: Diagnosis not present

## 2020-11-30 DIAGNOSIS — M6281 Muscle weakness (generalized): Secondary | ICD-10-CM | POA: Diagnosis not present

## 2020-11-30 DIAGNOSIS — I6990 Unspecified sequelae of unspecified cerebrovascular disease: Secondary | ICD-10-CM | POA: Diagnosis not present

## 2020-11-30 DIAGNOSIS — R41841 Cognitive communication deficit: Secondary | ICD-10-CM | POA: Diagnosis not present

## 2020-11-30 DIAGNOSIS — R2689 Other abnormalities of gait and mobility: Secondary | ICD-10-CM | POA: Diagnosis not present

## 2020-11-30 DIAGNOSIS — S4490XA Injury of unspecified nerve at shoulder and upper arm level, unspecified arm, initial encounter: Secondary | ICD-10-CM | POA: Diagnosis not present

## 2020-11-30 DIAGNOSIS — E785 Hyperlipidemia, unspecified: Secondary | ICD-10-CM | POA: Diagnosis not present

## 2020-12-01 ENCOUNTER — Other Ambulatory Visit: Payer: Self-pay

## 2020-12-01 DIAGNOSIS — Z23 Encounter for immunization: Secondary | ICD-10-CM | POA: Diagnosis not present

## 2020-12-02 ENCOUNTER — Other Ambulatory Visit: Payer: Self-pay

## 2020-12-02 DIAGNOSIS — I739 Peripheral vascular disease, unspecified: Secondary | ICD-10-CM

## 2020-12-02 DIAGNOSIS — M792 Neuralgia and neuritis, unspecified: Secondary | ICD-10-CM | POA: Diagnosis not present

## 2020-12-02 DIAGNOSIS — S4490XA Injury of unspecified nerve at shoulder and upper arm level, unspecified arm, initial encounter: Secondary | ICD-10-CM | POA: Diagnosis not present

## 2020-12-02 DIAGNOSIS — I6990 Unspecified sequelae of unspecified cerebrovascular disease: Secondary | ICD-10-CM | POA: Diagnosis not present

## 2020-12-02 DIAGNOSIS — R2689 Other abnormalities of gait and mobility: Secondary | ICD-10-CM | POA: Diagnosis not present

## 2020-12-02 DIAGNOSIS — G9341 Metabolic encephalopathy: Secondary | ICD-10-CM | POA: Diagnosis not present

## 2020-12-02 DIAGNOSIS — M6281 Muscle weakness (generalized): Secondary | ICD-10-CM | POA: Diagnosis not present

## 2020-12-02 NOTE — Progress Notes (Signed)
Error

## 2020-12-03 ENCOUNTER — Other Ambulatory Visit: Payer: Self-pay

## 2020-12-03 ENCOUNTER — Ambulatory Visit (INDEPENDENT_AMBULATORY_CARE_PROVIDER_SITE_OTHER): Payer: Medicare HMO | Admitting: Podiatry

## 2020-12-03 ENCOUNTER — Ambulatory Visit (INDEPENDENT_AMBULATORY_CARE_PROVIDER_SITE_OTHER): Payer: Medicare HMO

## 2020-12-03 DIAGNOSIS — Z9889 Other specified postprocedural states: Secondary | ICD-10-CM

## 2020-12-03 DIAGNOSIS — L97524 Non-pressure chronic ulcer of other part of left foot with necrosis of bone: Secondary | ICD-10-CM

## 2020-12-03 DIAGNOSIS — M2042 Other hammer toe(s) (acquired), left foot: Secondary | ICD-10-CM

## 2020-12-03 DIAGNOSIS — I739 Peripheral vascular disease, unspecified: Secondary | ICD-10-CM

## 2020-12-04 ENCOUNTER — Ambulatory Visit (HOSPITAL_COMMUNITY): Admit: 2020-12-04 | Payer: Medicare HMO

## 2020-12-04 ENCOUNTER — Encounter: Payer: Self-pay | Admitting: Podiatry

## 2020-12-04 NOTE — Progress Notes (Signed)
  Subjective:  Patient ID: Charles Morgan, male    DOB: 25-May-1930,  MRN: [id number]  Chief Complaint  Patient presents with  . Routine Post Op    PT stated that he is doing well he has no concerns at this time and denies any pain     DOS: 11/27/2020 Procedure: Left second toe amputation  87 y.o. male returns for post-op check.  Doing well minimal pain  Review of Systems: Negative except as noted in the HPI. Denies N/V/F/Ch.   Objective:  There were no vitals filed for this visit. There is no height or weight on file to calculate BMI. Constitutional Well developed. Well nourished.  Vascular Foot warm and well perfused. Capillary refill normal to all digits.   Neurologic Normal speech. Oriented to person, place, and time. Epicritic sensation to light touch grossly present bilaterally.  Dermatologic Skin healing well without signs of infection. Skin edges well coapted without signs of infection.  Orthopedic: Tenderness to palpation noted about the surgical site.   Radiographs: Status post left second toe amputation Assessment:   1. Status post surgery    Plan:  Patient was evaluated and treated and all questions answered.  S/p foot surgery left -Progressing as expected post-operatively. -XR: As above -WB Status: WBAT in surgical shoe -Sutures: We will leave for 2 more weeks. -Medications: No refills required -Foot redressed.  Return in about 2 weeks (around 12/17/2020) for remove sutures, check amputation.

## 2020-12-05 DIAGNOSIS — M6281 Muscle weakness (generalized): Secondary | ICD-10-CM | POA: Diagnosis not present

## 2020-12-05 DIAGNOSIS — M792 Neuralgia and neuritis, unspecified: Secondary | ICD-10-CM | POA: Diagnosis not present

## 2020-12-05 DIAGNOSIS — I6990 Unspecified sequelae of unspecified cerebrovascular disease: Secondary | ICD-10-CM | POA: Diagnosis not present

## 2020-12-05 DIAGNOSIS — G9341 Metabolic encephalopathy: Secondary | ICD-10-CM | POA: Diagnosis not present

## 2020-12-05 DIAGNOSIS — R2689 Other abnormalities of gait and mobility: Secondary | ICD-10-CM | POA: Diagnosis not present

## 2020-12-05 DIAGNOSIS — S4490XA Injury of unspecified nerve at shoulder and upper arm level, unspecified arm, initial encounter: Secondary | ICD-10-CM | POA: Diagnosis not present

## 2020-12-06 DIAGNOSIS — M792 Neuralgia and neuritis, unspecified: Secondary | ICD-10-CM | POA: Diagnosis not present

## 2020-12-06 DIAGNOSIS — R2689 Other abnormalities of gait and mobility: Secondary | ICD-10-CM | POA: Diagnosis not present

## 2020-12-06 DIAGNOSIS — I6990 Unspecified sequelae of unspecified cerebrovascular disease: Secondary | ICD-10-CM | POA: Diagnosis not present

## 2020-12-06 DIAGNOSIS — M6281 Muscle weakness (generalized): Secondary | ICD-10-CM | POA: Diagnosis not present

## 2020-12-06 DIAGNOSIS — S4490XA Injury of unspecified nerve at shoulder and upper arm level, unspecified arm, initial encounter: Secondary | ICD-10-CM | POA: Diagnosis not present

## 2020-12-06 DIAGNOSIS — G9341 Metabolic encephalopathy: Secondary | ICD-10-CM | POA: Diagnosis not present

## 2020-12-07 ENCOUNTER — Encounter: Payer: Self-pay | Admitting: Podiatry

## 2020-12-07 DIAGNOSIS — I6990 Unspecified sequelae of unspecified cerebrovascular disease: Secondary | ICD-10-CM | POA: Diagnosis not present

## 2020-12-07 DIAGNOSIS — M792 Neuralgia and neuritis, unspecified: Secondary | ICD-10-CM | POA: Diagnosis not present

## 2020-12-07 DIAGNOSIS — R2689 Other abnormalities of gait and mobility: Secondary | ICD-10-CM | POA: Diagnosis not present

## 2020-12-07 DIAGNOSIS — S4490XA Injury of unspecified nerve at shoulder and upper arm level, unspecified arm, initial encounter: Secondary | ICD-10-CM | POA: Diagnosis not present

## 2020-12-07 DIAGNOSIS — M6281 Muscle weakness (generalized): Secondary | ICD-10-CM | POA: Diagnosis not present

## 2020-12-07 DIAGNOSIS — G9341 Metabolic encephalopathy: Secondary | ICD-10-CM | POA: Diagnosis not present

## 2020-12-08 DIAGNOSIS — I6990 Unspecified sequelae of unspecified cerebrovascular disease: Secondary | ICD-10-CM | POA: Diagnosis not present

## 2020-12-08 DIAGNOSIS — R2689 Other abnormalities of gait and mobility: Secondary | ICD-10-CM | POA: Diagnosis not present

## 2020-12-08 DIAGNOSIS — S4490XA Injury of unspecified nerve at shoulder and upper arm level, unspecified arm, initial encounter: Secondary | ICD-10-CM | POA: Diagnosis not present

## 2020-12-08 DIAGNOSIS — M792 Neuralgia and neuritis, unspecified: Secondary | ICD-10-CM | POA: Diagnosis not present

## 2020-12-08 DIAGNOSIS — M6281 Muscle weakness (generalized): Secondary | ICD-10-CM | POA: Diagnosis not present

## 2020-12-08 DIAGNOSIS — G9341 Metabolic encephalopathy: Secondary | ICD-10-CM | POA: Diagnosis not present

## 2020-12-09 DIAGNOSIS — I6990 Unspecified sequelae of unspecified cerebrovascular disease: Secondary | ICD-10-CM | POA: Diagnosis not present

## 2020-12-09 DIAGNOSIS — S4490XA Injury of unspecified nerve at shoulder and upper arm level, unspecified arm, initial encounter: Secondary | ICD-10-CM | POA: Diagnosis not present

## 2020-12-09 DIAGNOSIS — M792 Neuralgia and neuritis, unspecified: Secondary | ICD-10-CM | POA: Diagnosis not present

## 2020-12-09 DIAGNOSIS — R2689 Other abnormalities of gait and mobility: Secondary | ICD-10-CM | POA: Diagnosis not present

## 2020-12-09 DIAGNOSIS — M6281 Muscle weakness (generalized): Secondary | ICD-10-CM | POA: Diagnosis not present

## 2020-12-09 DIAGNOSIS — G9341 Metabolic encephalopathy: Secondary | ICD-10-CM | POA: Diagnosis not present

## 2020-12-10 ENCOUNTER — Encounter: Payer: Medicare HMO | Admitting: Podiatry

## 2020-12-10 DIAGNOSIS — S4490XA Injury of unspecified nerve at shoulder and upper arm level, unspecified arm, initial encounter: Secondary | ICD-10-CM | POA: Diagnosis not present

## 2020-12-10 DIAGNOSIS — R2689 Other abnormalities of gait and mobility: Secondary | ICD-10-CM | POA: Diagnosis not present

## 2020-12-10 DIAGNOSIS — M792 Neuralgia and neuritis, unspecified: Secondary | ICD-10-CM | POA: Diagnosis not present

## 2020-12-10 DIAGNOSIS — I6990 Unspecified sequelae of unspecified cerebrovascular disease: Secondary | ICD-10-CM | POA: Diagnosis not present

## 2020-12-10 DIAGNOSIS — M6281 Muscle weakness (generalized): Secondary | ICD-10-CM | POA: Diagnosis not present

## 2020-12-10 DIAGNOSIS — G9341 Metabolic encephalopathy: Secondary | ICD-10-CM | POA: Diagnosis not present

## 2020-12-11 DIAGNOSIS — R2689 Other abnormalities of gait and mobility: Secondary | ICD-10-CM | POA: Diagnosis not present

## 2020-12-11 DIAGNOSIS — G9341 Metabolic encephalopathy: Secondary | ICD-10-CM | POA: Diagnosis not present

## 2020-12-11 DIAGNOSIS — M792 Neuralgia and neuritis, unspecified: Secondary | ICD-10-CM | POA: Diagnosis not present

## 2020-12-11 DIAGNOSIS — M6281 Muscle weakness (generalized): Secondary | ICD-10-CM | POA: Diagnosis not present

## 2020-12-11 DIAGNOSIS — I6990 Unspecified sequelae of unspecified cerebrovascular disease: Secondary | ICD-10-CM | POA: Diagnosis not present

## 2020-12-11 DIAGNOSIS — S4490XA Injury of unspecified nerve at shoulder and upper arm level, unspecified arm, initial encounter: Secondary | ICD-10-CM | POA: Diagnosis not present

## 2020-12-12 DIAGNOSIS — M6281 Muscle weakness (generalized): Secondary | ICD-10-CM | POA: Diagnosis not present

## 2020-12-12 DIAGNOSIS — M792 Neuralgia and neuritis, unspecified: Secondary | ICD-10-CM | POA: Diagnosis not present

## 2020-12-12 DIAGNOSIS — I6990 Unspecified sequelae of unspecified cerebrovascular disease: Secondary | ICD-10-CM | POA: Diagnosis not present

## 2020-12-12 DIAGNOSIS — G9341 Metabolic encephalopathy: Secondary | ICD-10-CM | POA: Diagnosis not present

## 2020-12-12 DIAGNOSIS — R2689 Other abnormalities of gait and mobility: Secondary | ICD-10-CM | POA: Diagnosis not present

## 2020-12-12 DIAGNOSIS — S4490XA Injury of unspecified nerve at shoulder and upper arm level, unspecified arm, initial encounter: Secondary | ICD-10-CM | POA: Diagnosis not present

## 2020-12-15 DIAGNOSIS — M792 Neuralgia and neuritis, unspecified: Secondary | ICD-10-CM | POA: Diagnosis not present

## 2020-12-15 DIAGNOSIS — S4490XA Injury of unspecified nerve at shoulder and upper arm level, unspecified arm, initial encounter: Secondary | ICD-10-CM | POA: Diagnosis not present

## 2020-12-15 DIAGNOSIS — I6990 Unspecified sequelae of unspecified cerebrovascular disease: Secondary | ICD-10-CM | POA: Diagnosis not present

## 2020-12-15 DIAGNOSIS — G9341 Metabolic encephalopathy: Secondary | ICD-10-CM | POA: Diagnosis not present

## 2020-12-15 DIAGNOSIS — M6281 Muscle weakness (generalized): Secondary | ICD-10-CM | POA: Diagnosis not present

## 2020-12-15 DIAGNOSIS — R2689 Other abnormalities of gait and mobility: Secondary | ICD-10-CM | POA: Diagnosis not present

## 2020-12-16 DIAGNOSIS — G9341 Metabolic encephalopathy: Secondary | ICD-10-CM | POA: Diagnosis not present

## 2020-12-16 DIAGNOSIS — S4490XA Injury of unspecified nerve at shoulder and upper arm level, unspecified arm, initial encounter: Secondary | ICD-10-CM | POA: Diagnosis not present

## 2020-12-16 DIAGNOSIS — M6281 Muscle weakness (generalized): Secondary | ICD-10-CM | POA: Diagnosis not present

## 2020-12-16 DIAGNOSIS — R2689 Other abnormalities of gait and mobility: Secondary | ICD-10-CM | POA: Diagnosis not present

## 2020-12-16 DIAGNOSIS — I6990 Unspecified sequelae of unspecified cerebrovascular disease: Secondary | ICD-10-CM | POA: Diagnosis not present

## 2020-12-16 DIAGNOSIS — M792 Neuralgia and neuritis, unspecified: Secondary | ICD-10-CM | POA: Diagnosis not present

## 2020-12-17 ENCOUNTER — Encounter: Payer: Self-pay | Admitting: Podiatry

## 2020-12-17 ENCOUNTER — Ambulatory Visit (INDEPENDENT_AMBULATORY_CARE_PROVIDER_SITE_OTHER): Payer: Medicare HMO | Admitting: Podiatry

## 2020-12-17 ENCOUNTER — Other Ambulatory Visit: Payer: Self-pay

## 2020-12-17 DIAGNOSIS — M792 Neuralgia and neuritis, unspecified: Secondary | ICD-10-CM | POA: Diagnosis not present

## 2020-12-17 DIAGNOSIS — M21612 Bunion of left foot: Secondary | ICD-10-CM

## 2020-12-17 DIAGNOSIS — M2012 Hallux valgus (acquired), left foot: Secondary | ICD-10-CM

## 2020-12-17 DIAGNOSIS — L97524 Non-pressure chronic ulcer of other part of left foot with necrosis of bone: Secondary | ICD-10-CM | POA: Diagnosis not present

## 2020-12-17 DIAGNOSIS — I739 Peripheral vascular disease, unspecified: Secondary | ICD-10-CM | POA: Diagnosis not present

## 2020-12-17 DIAGNOSIS — M6281 Muscle weakness (generalized): Secondary | ICD-10-CM | POA: Diagnosis not present

## 2020-12-17 DIAGNOSIS — M2042 Other hammer toe(s) (acquired), left foot: Secondary | ICD-10-CM | POA: Diagnosis not present

## 2020-12-17 DIAGNOSIS — S4490XA Injury of unspecified nerve at shoulder and upper arm level, unspecified arm, initial encounter: Secondary | ICD-10-CM | POA: Diagnosis not present

## 2020-12-17 DIAGNOSIS — Z9889 Other specified postprocedural states: Secondary | ICD-10-CM

## 2020-12-17 DIAGNOSIS — G9341 Metabolic encephalopathy: Secondary | ICD-10-CM | POA: Diagnosis not present

## 2020-12-17 DIAGNOSIS — I6990 Unspecified sequelae of unspecified cerebrovascular disease: Secondary | ICD-10-CM | POA: Diagnosis not present

## 2020-12-17 DIAGNOSIS — R2689 Other abnormalities of gait and mobility: Secondary | ICD-10-CM | POA: Diagnosis not present

## 2020-12-17 NOTE — Progress Notes (Signed)
  Subjective:  Patient ID: Charles Morgan, male    DOB: 07-07-1930,  MRN: [id number]  Chief Complaint  Patient presents with  . Routine Post Op    PT stated that he is doing well he has no concerns and denies pain at this time    DOS: 11/27/2020 Procedure: Left second toe amputation  87 y.o. male returns for post-op check.  Doing well minimal pain  Review of Systems: Negative except as noted in the HPI. Denies N/V/F/Ch.   Objective:  There were no vitals filed for this visit. There is no height or weight on file to calculate BMI. Constitutional Well developed. Well nourished.  Vascular Foot warm and well perfused. Capillary refill normal to all digits.   Neurologic Normal speech. Oriented to person, place, and time. Epicritic sensation to light touch grossly present bilaterally.  Dermatologic Skin healing well without signs of infection. Skin edges well coapted without signs of infection.  Orthopedic: Tenderness to palpation noted about the surgical site.   Radiographs: Status post left second toe amputation Assessment:   1. Hammertoe of left foot   2. Status post surgery   3. 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 Peripheral arterial disease (HCC)   5. Hallux valgus with bunions, left    Plan:  Patient was evaluated and treated and all questions answered.  S/p foot surgery left -All sutures were removed today, he may begin regular bathing and applying lotion to the incision.  Would like to see him back in 1 month to reevaluate.  May return to regular shoe gear when ready.  We discussed that his hallux valgus for a likely increase in severity with amputation of second toe and he understands and is okay with this.  Return in about 1 month (around 01/17/2021) for re-check incision from amputation.

## 2020-12-18 DIAGNOSIS — I6990 Unspecified sequelae of unspecified cerebrovascular disease: Secondary | ICD-10-CM | POA: Diagnosis not present

## 2020-12-18 DIAGNOSIS — M792 Neuralgia and neuritis, unspecified: Secondary | ICD-10-CM | POA: Diagnosis not present

## 2020-12-18 DIAGNOSIS — M6281 Muscle weakness (generalized): Secondary | ICD-10-CM | POA: Diagnosis not present

## 2020-12-18 DIAGNOSIS — G9341 Metabolic encephalopathy: Secondary | ICD-10-CM | POA: Diagnosis not present

## 2020-12-18 DIAGNOSIS — R2689 Other abnormalities of gait and mobility: Secondary | ICD-10-CM | POA: Diagnosis not present

## 2020-12-18 DIAGNOSIS — S4490XA Injury of unspecified nerve at shoulder and upper arm level, unspecified arm, initial encounter: Secondary | ICD-10-CM | POA: Diagnosis not present

## 2020-12-19 DIAGNOSIS — G9341 Metabolic encephalopathy: Secondary | ICD-10-CM | POA: Diagnosis not present

## 2020-12-19 DIAGNOSIS — R2689 Other abnormalities of gait and mobility: Secondary | ICD-10-CM | POA: Diagnosis not present

## 2020-12-19 DIAGNOSIS — S4490XA Injury of unspecified nerve at shoulder and upper arm level, unspecified arm, initial encounter: Secondary | ICD-10-CM | POA: Diagnosis not present

## 2020-12-19 DIAGNOSIS — M792 Neuralgia and neuritis, unspecified: Secondary | ICD-10-CM | POA: Diagnosis not present

## 2020-12-19 DIAGNOSIS — M6281 Muscle weakness (generalized): Secondary | ICD-10-CM | POA: Diagnosis not present

## 2020-12-19 DIAGNOSIS — I6990 Unspecified sequelae of unspecified cerebrovascular disease: Secondary | ICD-10-CM | POA: Diagnosis not present

## 2020-12-21 DIAGNOSIS — R2689 Other abnormalities of gait and mobility: Secondary | ICD-10-CM | POA: Diagnosis not present

## 2020-12-21 DIAGNOSIS — G9341 Metabolic encephalopathy: Secondary | ICD-10-CM | POA: Diagnosis not present

## 2020-12-21 DIAGNOSIS — S4490XA Injury of unspecified nerve at shoulder and upper arm level, unspecified arm, initial encounter: Secondary | ICD-10-CM | POA: Diagnosis not present

## 2020-12-21 DIAGNOSIS — M792 Neuralgia and neuritis, unspecified: Secondary | ICD-10-CM | POA: Diagnosis not present

## 2020-12-21 DIAGNOSIS — M6281 Muscle weakness (generalized): Secondary | ICD-10-CM | POA: Diagnosis not present

## 2020-12-21 DIAGNOSIS — I6990 Unspecified sequelae of unspecified cerebrovascular disease: Secondary | ICD-10-CM | POA: Diagnosis not present

## 2020-12-23 DIAGNOSIS — R2689 Other abnormalities of gait and mobility: Secondary | ICD-10-CM | POA: Diagnosis not present

## 2020-12-23 DIAGNOSIS — I6990 Unspecified sequelae of unspecified cerebrovascular disease: Secondary | ICD-10-CM | POA: Diagnosis not present

## 2020-12-23 DIAGNOSIS — S4490XA Injury of unspecified nerve at shoulder and upper arm level, unspecified arm, initial encounter: Secondary | ICD-10-CM | POA: Diagnosis not present

## 2020-12-23 DIAGNOSIS — M792 Neuralgia and neuritis, unspecified: Secondary | ICD-10-CM | POA: Diagnosis not present

## 2020-12-23 DIAGNOSIS — G9341 Metabolic encephalopathy: Secondary | ICD-10-CM | POA: Diagnosis not present

## 2020-12-23 DIAGNOSIS — M6281 Muscle weakness (generalized): Secondary | ICD-10-CM | POA: Diagnosis not present

## 2020-12-24 ENCOUNTER — Encounter: Payer: Medicare HMO | Admitting: Podiatry

## 2020-12-24 DIAGNOSIS — I6990 Unspecified sequelae of unspecified cerebrovascular disease: Secondary | ICD-10-CM | POA: Diagnosis not present

## 2020-12-24 DIAGNOSIS — M6281 Muscle weakness (generalized): Secondary | ICD-10-CM | POA: Diagnosis not present

## 2020-12-24 DIAGNOSIS — M792 Neuralgia and neuritis, unspecified: Secondary | ICD-10-CM | POA: Diagnosis not present

## 2020-12-24 DIAGNOSIS — R2689 Other abnormalities of gait and mobility: Secondary | ICD-10-CM | POA: Diagnosis not present

## 2020-12-24 DIAGNOSIS — S4490XA Injury of unspecified nerve at shoulder and upper arm level, unspecified arm, initial encounter: Secondary | ICD-10-CM | POA: Diagnosis not present

## 2020-12-24 DIAGNOSIS — G9341 Metabolic encephalopathy: Secondary | ICD-10-CM | POA: Diagnosis not present

## 2020-12-25 DIAGNOSIS — M6281 Muscle weakness (generalized): Secondary | ICD-10-CM | POA: Diagnosis not present

## 2020-12-25 DIAGNOSIS — S4490XA Injury of unspecified nerve at shoulder and upper arm level, unspecified arm, initial encounter: Secondary | ICD-10-CM | POA: Diagnosis not present

## 2020-12-25 DIAGNOSIS — G9341 Metabolic encephalopathy: Secondary | ICD-10-CM | POA: Diagnosis not present

## 2020-12-25 DIAGNOSIS — R2689 Other abnormalities of gait and mobility: Secondary | ICD-10-CM | POA: Diagnosis not present

## 2020-12-25 DIAGNOSIS — M792 Neuralgia and neuritis, unspecified: Secondary | ICD-10-CM | POA: Diagnosis not present

## 2020-12-25 DIAGNOSIS — I6990 Unspecified sequelae of unspecified cerebrovascular disease: Secondary | ICD-10-CM | POA: Diagnosis not present

## 2020-12-26 DIAGNOSIS — I6990 Unspecified sequelae of unspecified cerebrovascular disease: Secondary | ICD-10-CM | POA: Diagnosis not present

## 2020-12-26 DIAGNOSIS — M792 Neuralgia and neuritis, unspecified: Secondary | ICD-10-CM | POA: Diagnosis not present

## 2020-12-26 DIAGNOSIS — R2689 Other abnormalities of gait and mobility: Secondary | ICD-10-CM | POA: Diagnosis not present

## 2020-12-26 DIAGNOSIS — G9341 Metabolic encephalopathy: Secondary | ICD-10-CM | POA: Diagnosis not present

## 2020-12-26 DIAGNOSIS — M6281 Muscle weakness (generalized): Secondary | ICD-10-CM | POA: Diagnosis not present

## 2020-12-26 DIAGNOSIS — S4490XA Injury of unspecified nerve at shoulder and upper arm level, unspecified arm, initial encounter: Secondary | ICD-10-CM | POA: Diagnosis not present

## 2020-12-28 DIAGNOSIS — S4490XA Injury of unspecified nerve at shoulder and upper arm level, unspecified arm, initial encounter: Secondary | ICD-10-CM | POA: Diagnosis not present

## 2020-12-28 DIAGNOSIS — G9341 Metabolic encephalopathy: Secondary | ICD-10-CM | POA: Diagnosis not present

## 2020-12-28 DIAGNOSIS — M792 Neuralgia and neuritis, unspecified: Secondary | ICD-10-CM | POA: Diagnosis not present

## 2020-12-28 DIAGNOSIS — M6281 Muscle weakness (generalized): Secondary | ICD-10-CM | POA: Diagnosis not present

## 2020-12-28 DIAGNOSIS — I6990 Unspecified sequelae of unspecified cerebrovascular disease: Secondary | ICD-10-CM | POA: Diagnosis not present

## 2020-12-28 DIAGNOSIS — R2689 Other abnormalities of gait and mobility: Secondary | ICD-10-CM | POA: Diagnosis not present

## 2020-12-29 DIAGNOSIS — G9341 Metabolic encephalopathy: Secondary | ICD-10-CM | POA: Diagnosis not present

## 2020-12-29 DIAGNOSIS — M6281 Muscle weakness (generalized): Secondary | ICD-10-CM | POA: Diagnosis not present

## 2020-12-29 DIAGNOSIS — S4490XA Injury of unspecified nerve at shoulder and upper arm level, unspecified arm, initial encounter: Secondary | ICD-10-CM | POA: Diagnosis not present

## 2020-12-29 DIAGNOSIS — I6990 Unspecified sequelae of unspecified cerebrovascular disease: Secondary | ICD-10-CM | POA: Diagnosis not present

## 2020-12-29 DIAGNOSIS — M792 Neuralgia and neuritis, unspecified: Secondary | ICD-10-CM | POA: Diagnosis not present

## 2020-12-29 DIAGNOSIS — R2689 Other abnormalities of gait and mobility: Secondary | ICD-10-CM | POA: Diagnosis not present

## 2020-12-30 DIAGNOSIS — I6990 Unspecified sequelae of unspecified cerebrovascular disease: Secondary | ICD-10-CM | POA: Diagnosis not present

## 2020-12-30 DIAGNOSIS — M6281 Muscle weakness (generalized): Secondary | ICD-10-CM | POA: Diagnosis not present

## 2020-12-30 DIAGNOSIS — G9341 Metabolic encephalopathy: Secondary | ICD-10-CM | POA: Diagnosis not present

## 2020-12-30 DIAGNOSIS — M792 Neuralgia and neuritis, unspecified: Secondary | ICD-10-CM | POA: Diagnosis not present

## 2020-12-30 DIAGNOSIS — R2689 Other abnormalities of gait and mobility: Secondary | ICD-10-CM | POA: Diagnosis not present

## 2020-12-30 DIAGNOSIS — S4490XA Injury of unspecified nerve at shoulder and upper arm level, unspecified arm, initial encounter: Secondary | ICD-10-CM | POA: Diagnosis not present

## 2020-12-31 DIAGNOSIS — I6990 Unspecified sequelae of unspecified cerebrovascular disease: Secondary | ICD-10-CM | POA: Diagnosis not present

## 2020-12-31 DIAGNOSIS — M6281 Muscle weakness (generalized): Secondary | ICD-10-CM | POA: Diagnosis not present

## 2020-12-31 DIAGNOSIS — R2689 Other abnormalities of gait and mobility: Secondary | ICD-10-CM | POA: Diagnosis not present

## 2020-12-31 DIAGNOSIS — G9341 Metabolic encephalopathy: Secondary | ICD-10-CM | POA: Diagnosis not present

## 2020-12-31 DIAGNOSIS — S4490XA Injury of unspecified nerve at shoulder and upper arm level, unspecified arm, initial encounter: Secondary | ICD-10-CM | POA: Diagnosis not present

## 2020-12-31 DIAGNOSIS — M792 Neuralgia and neuritis, unspecified: Secondary | ICD-10-CM | POA: Diagnosis not present

## 2021-01-01 DIAGNOSIS — M792 Neuralgia and neuritis, unspecified: Secondary | ICD-10-CM | POA: Diagnosis not present

## 2021-01-01 DIAGNOSIS — S4490XA Injury of unspecified nerve at shoulder and upper arm level, unspecified arm, initial encounter: Secondary | ICD-10-CM | POA: Diagnosis not present

## 2021-01-01 DIAGNOSIS — M6281 Muscle weakness (generalized): Secondary | ICD-10-CM | POA: Diagnosis not present

## 2021-01-01 DIAGNOSIS — R2689 Other abnormalities of gait and mobility: Secondary | ICD-10-CM | POA: Diagnosis not present

## 2021-01-01 DIAGNOSIS — G9341 Metabolic encephalopathy: Secondary | ICD-10-CM | POA: Diagnosis not present

## 2021-01-01 DIAGNOSIS — I6990 Unspecified sequelae of unspecified cerebrovascular disease: Secondary | ICD-10-CM | POA: Diagnosis not present

## 2021-01-02 DIAGNOSIS — G9341 Metabolic encephalopathy: Secondary | ICD-10-CM | POA: Diagnosis not present

## 2021-01-02 DIAGNOSIS — S4490XA Injury of unspecified nerve at shoulder and upper arm level, unspecified arm, initial encounter: Secondary | ICD-10-CM | POA: Diagnosis not present

## 2021-01-02 DIAGNOSIS — I6990 Unspecified sequelae of unspecified cerebrovascular disease: Secondary | ICD-10-CM | POA: Diagnosis not present

## 2021-01-02 DIAGNOSIS — M792 Neuralgia and neuritis, unspecified: Secondary | ICD-10-CM | POA: Diagnosis not present

## 2021-01-02 DIAGNOSIS — R2689 Other abnormalities of gait and mobility: Secondary | ICD-10-CM | POA: Diagnosis not present

## 2021-01-02 DIAGNOSIS — M6281 Muscle weakness (generalized): Secondary | ICD-10-CM | POA: Diagnosis not present

## 2021-01-04 DIAGNOSIS — M6281 Muscle weakness (generalized): Secondary | ICD-10-CM | POA: Diagnosis not present

## 2021-01-04 DIAGNOSIS — S4490XA Injury of unspecified nerve at shoulder and upper arm level, unspecified arm, initial encounter: Secondary | ICD-10-CM | POA: Diagnosis not present

## 2021-01-04 DIAGNOSIS — M792 Neuralgia and neuritis, unspecified: Secondary | ICD-10-CM | POA: Diagnosis not present

## 2021-01-04 DIAGNOSIS — G9341 Metabolic encephalopathy: Secondary | ICD-10-CM | POA: Diagnosis not present

## 2021-01-04 DIAGNOSIS — R2689 Other abnormalities of gait and mobility: Secondary | ICD-10-CM | POA: Diagnosis not present

## 2021-01-04 DIAGNOSIS — I6990 Unspecified sequelae of unspecified cerebrovascular disease: Secondary | ICD-10-CM | POA: Diagnosis not present

## 2021-01-05 DIAGNOSIS — M792 Neuralgia and neuritis, unspecified: Secondary | ICD-10-CM | POA: Diagnosis not present

## 2021-01-05 DIAGNOSIS — S4490XA Injury of unspecified nerve at shoulder and upper arm level, unspecified arm, initial encounter: Secondary | ICD-10-CM | POA: Diagnosis not present

## 2021-01-05 DIAGNOSIS — M6281 Muscle weakness (generalized): Secondary | ICD-10-CM | POA: Diagnosis not present

## 2021-01-05 DIAGNOSIS — R2689 Other abnormalities of gait and mobility: Secondary | ICD-10-CM | POA: Diagnosis not present

## 2021-01-05 DIAGNOSIS — G9341 Metabolic encephalopathy: Secondary | ICD-10-CM | POA: Diagnosis not present

## 2021-01-05 DIAGNOSIS — I6990 Unspecified sequelae of unspecified cerebrovascular disease: Secondary | ICD-10-CM | POA: Diagnosis not present

## 2021-01-06 DIAGNOSIS — M6281 Muscle weakness (generalized): Secondary | ICD-10-CM | POA: Diagnosis not present

## 2021-01-06 DIAGNOSIS — S4490XA Injury of unspecified nerve at shoulder and upper arm level, unspecified arm, initial encounter: Secondary | ICD-10-CM | POA: Diagnosis not present

## 2021-01-06 DIAGNOSIS — R2689 Other abnormalities of gait and mobility: Secondary | ICD-10-CM | POA: Diagnosis not present

## 2021-01-06 DIAGNOSIS — I6990 Unspecified sequelae of unspecified cerebrovascular disease: Secondary | ICD-10-CM | POA: Diagnosis not present

## 2021-01-06 DIAGNOSIS — M792 Neuralgia and neuritis, unspecified: Secondary | ICD-10-CM | POA: Diagnosis not present

## 2021-01-06 DIAGNOSIS — G9341 Metabolic encephalopathy: Secondary | ICD-10-CM | POA: Diagnosis not present

## 2021-01-07 DIAGNOSIS — M792 Neuralgia and neuritis, unspecified: Secondary | ICD-10-CM | POA: Diagnosis not present

## 2021-01-07 DIAGNOSIS — M6281 Muscle weakness (generalized): Secondary | ICD-10-CM | POA: Diagnosis not present

## 2021-01-07 DIAGNOSIS — S4490XA Injury of unspecified nerve at shoulder and upper arm level, unspecified arm, initial encounter: Secondary | ICD-10-CM | POA: Diagnosis not present

## 2021-01-07 DIAGNOSIS — I6990 Unspecified sequelae of unspecified cerebrovascular disease: Secondary | ICD-10-CM | POA: Diagnosis not present

## 2021-01-07 DIAGNOSIS — R2689 Other abnormalities of gait and mobility: Secondary | ICD-10-CM | POA: Diagnosis not present

## 2021-01-07 DIAGNOSIS — G9341 Metabolic encephalopathy: Secondary | ICD-10-CM | POA: Diagnosis not present

## 2021-01-08 DIAGNOSIS — S4490XA Injury of unspecified nerve at shoulder and upper arm level, unspecified arm, initial encounter: Secondary | ICD-10-CM | POA: Diagnosis not present

## 2021-01-08 DIAGNOSIS — M792 Neuralgia and neuritis, unspecified: Secondary | ICD-10-CM | POA: Diagnosis not present

## 2021-01-08 DIAGNOSIS — R2689 Other abnormalities of gait and mobility: Secondary | ICD-10-CM | POA: Diagnosis not present

## 2021-01-08 DIAGNOSIS — I6990 Unspecified sequelae of unspecified cerebrovascular disease: Secondary | ICD-10-CM | POA: Diagnosis not present

## 2021-01-08 DIAGNOSIS — G9341 Metabolic encephalopathy: Secondary | ICD-10-CM | POA: Diagnosis not present

## 2021-01-08 DIAGNOSIS — M6281 Muscle weakness (generalized): Secondary | ICD-10-CM | POA: Diagnosis not present

## 2021-01-19 DIAGNOSIS — M5000 Cervical disc disorder with myelopathy, unspecified cervical region: Secondary | ICD-10-CM | POA: Diagnosis not present

## 2021-01-19 DIAGNOSIS — R2689 Other abnormalities of gait and mobility: Secondary | ICD-10-CM | POA: Diagnosis not present

## 2021-01-19 DIAGNOSIS — M130 Polyarthritis, unspecified: Secondary | ICD-10-CM | POA: Diagnosis not present

## 2021-01-19 DIAGNOSIS — E1142 Type 2 diabetes mellitus with diabetic polyneuropathy: Secondary | ICD-10-CM | POA: Diagnosis not present

## 2021-01-19 DIAGNOSIS — G5631 Lesion of radial nerve, right upper limb: Secondary | ICD-10-CM | POA: Diagnosis not present

## 2021-01-19 DIAGNOSIS — G5603 Carpal tunnel syndrome, bilateral upper limbs: Secondary | ICD-10-CM | POA: Diagnosis not present

## 2021-01-21 ENCOUNTER — Other Ambulatory Visit: Payer: Self-pay

## 2021-01-21 ENCOUNTER — Ambulatory Visit (INDEPENDENT_AMBULATORY_CARE_PROVIDER_SITE_OTHER): Payer: Medicare HMO | Admitting: Podiatry

## 2021-01-21 ENCOUNTER — Encounter: Payer: Self-pay | Admitting: Podiatry

## 2021-01-21 DIAGNOSIS — M21611 Bunion of right foot: Secondary | ICD-10-CM | POA: Diagnosis not present

## 2021-01-21 DIAGNOSIS — Z9889 Other specified postprocedural states: Secondary | ICD-10-CM

## 2021-01-21 DIAGNOSIS — M2011 Hallux valgus (acquired), right foot: Secondary | ICD-10-CM

## 2021-01-21 DIAGNOSIS — M2042 Other hammer toe(s) (acquired), left foot: Secondary | ICD-10-CM

## 2021-01-21 DIAGNOSIS — M5412 Radiculopathy, cervical region: Secondary | ICD-10-CM | POA: Diagnosis not present

## 2021-01-21 DIAGNOSIS — G5621 Lesion of ulnar nerve, right upper limb: Secondary | ICD-10-CM | POA: Diagnosis not present

## 2021-01-21 NOTE — Progress Notes (Signed)
  Subjective:  Patient ID: Charles Morgan, male    DOB: [birth year],  MRN: [id number]  Chief Complaint  Patient presents with  . Routine Post Op    Pt stated that he is doing well he denies pain at this time     DOS: 11/27/2020 Procedure: Left second toe amputation  87 y.o. male returns for post-op check.  Doing well minimal pain.  His right foot is bothering him on the bunion  Review of Systems: Negative except as noted in the HPI. Denies N/V/F/Ch.   Objective:  There were no vitals filed for this visit. There is no height or weight on file to calculate BMI. Constitutional Well developed. Well nourished.  Vascular Foot warm and well perfused. Capillary refill normal to all digits.   Neurologic Normal speech. Oriented to person, place, and time. Epicritic sensation to light touch grossly present bilaterally.  Dermatologic  incision is well-healed  Orthopedic:  No pain in amputation site.  He has severe hallux valgus on both sides with some pain on the right side   Radiographs: Status post left second toe amputation Assessment:   1. Hammertoe of left foot   2. Status post surgery   3. Hallux valgus with bunions, right    Plan:  Patient was evaluated and treated and all questions answered.  S/p foot surgery left - Doing well continue regular tibias and bathing and shoe gear on the left side -On the right side he is having some pain from his hallux valgus.  He is not a surgical candidate.  Recommend offloading with a silicone bunion pad which I gave him today.  Return if symptoms worsen or fail to improve.

## 2021-03-26 ENCOUNTER — Emergency Department (HOSPITAL_BASED_OUTPATIENT_CLINIC_OR_DEPARTMENT_OTHER): Payer: Medicare HMO

## 2021-03-26 ENCOUNTER — Emergency Department (HOSPITAL_BASED_OUTPATIENT_CLINIC_OR_DEPARTMENT_OTHER)
Admission: EM | Admit: 2021-03-26 | Discharge: 2021-03-26 | Disposition: A | Discharge status: Home / Self Care | Payer: Medicare HMO | Attending: Emergency Medicine | Admitting: Emergency Medicine

## 2021-03-26 ENCOUNTER — Emergency Department (HOSPITAL_BASED_OUTPATIENT_CLINIC_OR_DEPARTMENT_OTHER): Payer: Medicare HMO | Admitting: Radiology

## 2021-03-26 ENCOUNTER — Other Ambulatory Visit: Payer: Self-pay

## 2021-03-26 ENCOUNTER — Encounter (HOSPITAL_BASED_OUTPATIENT_CLINIC_OR_DEPARTMENT_OTHER): Payer: Self-pay | Admitting: Emergency Medicine

## 2021-03-26 DIAGNOSIS — N183 Chronic kidney disease, stage 3 unspecified: Secondary | ICD-10-CM | POA: Diagnosis not present

## 2021-03-26 DIAGNOSIS — K567 Ileus, unspecified: Secondary | ICD-10-CM | POA: Diagnosis not present

## 2021-03-26 DIAGNOSIS — Z7982 Long term (current) use of aspirin: Secondary | ICD-10-CM | POA: Diagnosis not present

## 2021-03-26 DIAGNOSIS — Z7902 Long term (current) use of antithrombotics/antiplatelets: Secondary | ICD-10-CM | POA: Insufficient documentation

## 2021-03-26 DIAGNOSIS — R109 Unspecified abdominal pain: Secondary | ICD-10-CM | POA: Diagnosis not present

## 2021-03-26 DIAGNOSIS — K5900 Constipation, unspecified: Secondary | ICD-10-CM | POA: Insufficient documentation

## 2021-03-26 DIAGNOSIS — R1084 Generalized abdominal pain: Secondary | ICD-10-CM | POA: Diagnosis not present

## 2021-03-26 LAB — CBC WITH DIFFERENTIAL/PLATELET
Abs Immature Granulocytes: 0.04 10*3/uL (ref 0.00–0.07)
Basophils Absolute: 0 10*3/uL (ref 0.0–0.1)
Basophils Relative: 0 %
Eosinophils Absolute: 0 10*3/uL (ref 0.0–0.5)
Eosinophils Relative: 0 %
HCT: 42.8 % (ref 39.0–52.0)
Hemoglobin: 14.1 g/dL (ref 13.0–17.0)
Immature Granulocytes: 0 %
Lymphocytes Relative: 11 %
Lymphs Abs: 1.1 10*3/uL (ref 0.7–4.0)
MCH: 31.1 pg (ref 26.0–34.0)
MCHC: 32.9 g/dL (ref 30.0–36.0)
MCV: 94.5 fL (ref 80.0–100.0)
Monocytes Absolute: 0.6 10*3/uL (ref 0.1–1.0)
Monocytes Relative: 6 %
Neutro Abs: 7.7 10*3/uL (ref 1.7–7.7)
Neutrophils Relative %: 83 %
Platelets: 113 10*3/uL — ABNORMAL LOW (ref 150–400)
RBC: 4.53 MIL/uL (ref 4.22–5.81)
RDW: 14 % (ref 11.5–15.5)
WBC: 9.5 10*3/uL (ref 4.0–10.5)
nRBC: 0 % (ref 0.0–0.2)

## 2021-03-26 LAB — LIPASE, BLOOD: Lipase: 12 U/L (ref 11–51)

## 2021-03-26 LAB — COMPREHENSIVE METABOLIC PANEL
ALT: 12 U/L (ref 0–44)
AST: 27 U/L (ref 15–41)
Albumin: 4 g/dL (ref 3.5–5.0)
Alkaline Phosphatase: 66 U/L (ref 38–126)
Anion gap: 12 (ref 5–15)
BUN: 28 mg/dL — ABNORMAL HIGH (ref 8–23)
CO2: 24 mmol/L (ref 22–32)
Calcium: 10.1 mg/dL (ref 8.9–10.3)
Chloride: 105 mmol/L (ref 98–111)
Creatinine, Ser: 1.11 mg/dL (ref 0.61–1.24)
GFR, Estimated: >60 mL/min (ref >60)
Glucose, Bld: 83 mg/dL (ref 70–99)
Potassium: 3.9 mmol/L (ref 3.5–5.1)
Sodium: 141 mmol/L (ref 135–145)
Total Bilirubin: 1 mg/dL (ref 0.3–1.2)
Total Protein: 7.3 g/dL (ref 6.5–8.1)

## 2021-03-26 MED ORDER — POLYETHYLENE GLYCOL 3350 17 G PO PACK: 17.0000 g | PACK | Freq: Every day | ORAL | 0 refills | Status: DC

## 2021-03-26 MED ORDER — SODIUM CHLORIDE 0.9 % IV BOLUS
1000.0000 mL | Freq: Once | INTRAVENOUS | Status: AC
  Administered 2021-03-26: 1000 mL via INTRAVENOUS

## 2021-03-26 MED ORDER — IOHEXOL 300 MG/ML  SOLN
75.0000 mL | Freq: Once | INTRAMUSCULAR | Status: AC | PRN
  Administered 2021-03-26: 75 mL via INTRAVENOUS

## 2021-03-26 MED ORDER — FLEET ENEMA 7-19 GM/118ML RE ENEM
1.0000 | ENEMA | Freq: Once | RECTAL | Status: AC
  Administered 2021-03-26: 1 via RECTAL
  Filled 2021-03-26: qty 1

## 2021-03-26 NOTE — ED Triage Notes (Signed)
Pt from home via [facility] related to constipation for 4 days. Pt also complains of abdominal pain.

## 2021-03-26 NOTE — ED Notes (Signed)
Granddaughter call to pick up patient for discharge home

## 2021-03-26 NOTE — ED Notes (Signed)
Had good results from enema.  Patient states abdominal pain better

## 2021-03-26 NOTE — ED Notes (Signed)
Phone grand daughter for no answer

## 2021-03-26 NOTE — Discharge Instructions (Addendum)
Call your primary care doctor or specialist as discussed in the next 2-3 days.   Return immediately back to the ER if:  Your symptoms worsen within the next 12-24 hours. You develop new symptoms such as new fevers, persistent vomiting, new pain, shortness of breath, or new weakness or numbness, or if you have any other concerns.  

## 2021-03-26 NOTE — ED Provider Notes (Signed)
[facility]   CSN: [id number] Arrival date & time: 03/26/21  1138     History Chief Complaint  Patient presents with   Constipation    Charles LodgeLeroy Ainley is a [age] y.o. male.  Pt presents to the ED today with abdominal pain.  He has not had a bm in 4 days.  No f/c.      Past Medical History:  Diagnosis Date   Aortic stenosis    Arthritis    Bradycardia, sinus 2021   Nocturnal bradycardia, not clearly symptomatic or associated with syncope   CVA (cerebral vascular accident) (HCC)    Two events: 2013, 2019   DVT (deep venous thrombosis) (HCC) Aug '20   right LE   HLD (hyperlipidemia)    NSVT (nonsustained ventricular tachycardia) (HCC)    LVEF normal, not associated with syncope   Proctocolitis without complication Aug '21   CT diagnosis   Syncope and collapse    Multiple recurrences - vertebrobasilar insufficiency, autonomic dysfunction, ILR placed by cardiologist in [place]   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 Neurology eval - no need for further w/u   Vertebrobasilar artery insufficiency     Patient Active Problem List   Diagnosis Date Noted   Corns and callus 11/27/2020   Primary open angle glaucoma 11/27/2020   Pain in wrist 11/27/2020   Onychomycosis 11/27/2020   Old myocardial infarction 11/27/2020   Occupational maladjustment 11/27/2020   Nonspecific reaction to tuberculin skin test 11/27/2020   Neuralgia 11/27/2020   Microscopic hematuria 11/27/2020   Metabolic encephalopathy 11/27/2020   Lung field abnormal 11/27/2020   Localized, primary osteoarthritis of hand 11/27/2020   Intracranial hemorrhage (HCC) 11/27/2020   Inguinal hernia 11/27/2020   Impacted cerumen 11/27/2020   Housing lack 11/27/2020   History of thrombocytopenia 11/27/2020   History of fall 11/27/2020   Groin mass 11/27/2020   Esophageal reflux 11/27/2020   Edema 11/27/2020   Dysphagia 11/27/2020   Cocaine abuse,  episodic use (HCC) 11/27/2020   Constipation 11/27/2020   Disorder of oral mucous membrane 11/27/2020   Duodenal ulcer without hemorrhage, perforation, or obstruction 11/27/2020   Benign prostatic hyperplasia 11/27/2020   Atopic dermatitis 11/27/2020   Allergic rhinitis 11/27/2020   Alcohol abuse 11/27/2020   Acute pharyngitis 11/27/2020   Bilateral hand pain 11/27/2020   Mild cognitive impairment 11/27/2020   Neuropraxia of right upper extremity, initial encounter 09/04/2020   Lactic acidosis 09/01/2020   Hypoalbuminemia 09/01/2020   Hyperammonemia (HCC) 09/01/2020   Altered mental status 08/31/2020   CKD (chronic kidney disease) stage 3, GFR 30-59 ml/min (HCC) 06/05/2020   Basilar artery stenosis 06/05/2020   Transaminasemia 06/05/2020   Thrombocytopenia (HCC) 06/05/2020   Syncope 06/04/2020   Dehydration 06/04/2020   AKI (acute kidney injury) (HCC) 06/04/2020   HLD (hyperlipidemia) 06/04/2020   Calcification of aortic valve 06/04/2020   Syncope and collapse 06/04/2020   Ventricular tachycardia (HCC) 02/18/2020   Adrenal insufficiency (Addison's disease) (HCC) 01/26/2020   Ambulatory dysfunction 01/26/2020   Severe muscle deconditioning 01/12/2020   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   Elevated AST (SGOT) 01/11/2020   History of stroke 01/10/2020   Epigastric pain 12/06/2018   Stroke (HCC) 08/31/2017   Near syncope 11/04/2015    Past Surgical History:  Procedure Laterality Date   ABDOMINAL AORTOGRAM W/LOWER EXTREMITY N/A 11/02/2020   Procedure: ABDOMINAL AORTOGRAM W/LOWER EXTREMITY;  Surgeon: Maeola Harmanain, Brandon Christopher, MD;  [facility];  Service:  Cardiovascular;  Laterality: N/A;  Bilatera;   ABDOMINAL SURGERY     FLEXIBLE SIGMOIDOSCOPY N/A 06/05/2020   Procedure: FLEXIBLE SIGMOIDOSCOPY;  Surgeon: Malissa Hippo, MD;  Location: [facility];  Service: Endoscopy;  Laterality: N/A;   HEMORRHOIDECTOMY WITH HEMORRHOID BANDING     LOOP RECORDER IMPLANT      PERIPHERAL VASCULAR ATHERECTOMY  11/02/2020   Procedure: PERIPHERAL VASCULAR ATHERECTOMY;  Surgeon: Maeola Harman, MD;  Location: [facility] INVASIVE CV LAB;  Service: Cardiovascular;;  Laser - AT    SHOULDER SURGERY     Thumb surgery         Family History  Family history unknown: Yes    Social History   Tobacco Use   Smoking status: Never   Smokeless tobacco: Never  Vaping Use   Vaping Use: Never used  Substance Use Topics   Alcohol use: Never   Drug use: Never    Home Medications Prior to Admission medications   Medication Sig Start Date End Date Taking? Authorizing Provider  polyethylene glycol (MIRALAX) 17 g packet Take 17 g by mouth daily. 03/26/21  Yes Jacalyn Lefevre, MD  acetaminophen (TYLENOL) 325 MG tablet Take 325 mg by mouth every 6 (six) hours as needed.  10/03/19   [provider]  ammonium lactate (LAC-HYDRIN) 12 % lotion Apply 1 application topically as needed for dry skin.  03/10/20   [provider]  aspirin 81 MG chewable tablet Chew 81 mg by mouth daily.    [provider]  atorvastatin (LIPITOR) 80 MG tablet Take 1 tablet (80 mg total) by mouth daily. 06/06/20   Dahal, Melina Schools, MD  Brinzolamide-Brimonidine 1-0.2 % SUSP Place 1 drop into both eyes in the morning and at bedtime.  12/12/19   [provider]  clopidogrel (PLAVIX) 75 MG tablet Take 1 tablet (75 mg total) by mouth daily. 11/02/20 11/02/21  Maeola Harman, MD  docusate sodium (COLACE) 100 MG capsule Take 100 mg by mouth 2 (two) times daily.    [provider]  lactulose (CHRONULAC) 10 GM/15ML solution Take 30 mLs (20 g total) by mouth 2 (two) times daily as needed for mild constipation or moderate constipation. 09/04/20   Dorcas Carrow, MD  mupirocin ointment (BACTROBAN) 2 % Apply 1 application topically 2 (two) times daily. 10/06/20   McDonald, Rachelle Hora, DPM  oxyCODONE-acetaminophen (PERCOCET/ROXICET) 5-325 MG tablet Take 1-2 tablets by mouth every 4  (four) hours as needed for severe pain. 11/27/20   Edwin Cap, DPM    Allergies    Tuberculin  Review of Systems   Review of Systems  Gastrointestinal:  Positive for abdominal pain and constipation.  All other systems reviewed and are negative.  Physical Exam Updated Vital Signs BP (!) 154/77 (BP Location: Right Arm)   Pulse (!) 59   Temp 97.8 F (36.6 C) (Oral)   Resp 14   Ht 5\' 7"  (1.702 m)   Wt 63 kg   SpO2 (!) 0%   BMI 21.77 kg/m   Physical Exam Vitals and nursing note reviewed.  Constitutional:      Appearance: Normal appearance.  HENT:     Head: Normocephalic and atraumatic.     Right Ear: External ear normal.     Left Ear: External ear normal.     Nose: Nose normal.     Mouth/Throat:     Mouth: Mucous membranes are moist.     Pharynx: Oropharynx is clear.  Eyes:     Extraocular Movements:  Extraocular movements intact.     Conjunctiva/sclera: Conjunctivae normal.     Pupils: Pupils are equal, round, and reactive to light.  Cardiovascular:     Rate and Rhythm: Normal rate and regular rhythm.     Pulses: Normal pulses.     Heart sounds: Normal heart sounds.  Pulmonary:     Effort: Pulmonary effort is normal.     Breath sounds: Normal breath sounds.  Abdominal:     General: Abdomen is flat. Bowel sounds are decreased.     Palpations: Abdomen is soft.     Tenderness: There is generalized abdominal tenderness.  Musculoskeletal:        General: Normal range of motion.     Cervical back: Normal range of motion and neck supple.  Skin:    General: Skin is warm.     Capillary Refill: Capillary refill takes less than 2 seconds.  Neurological:     General: No focal deficit present.     Mental Status: He is alert and oriented to person, place, and time.  Psychiatric:        Mood and Affect: Mood normal.        Behavior: Behavior normal.    ED Results / Procedures / Treatments   Labs (all labs ordered are listed, but only abnormal results are  displayed) Labs Reviewed  CBC WITH DIFFERENTIAL/PLATELET - Abnormal; Notable for the following components:      Result Value   Platelets 113 (*)    All other components within normal limits  COMPREHENSIVE METABOLIC PANEL - Abnormal; Notable for the following components:   BUN 28 (*)    All other components within normal limits  LIPASE, BLOOD  URINALYSIS, ROUTINE W REFLEX MICROSCOPIC    EKG None  Radiology CT ABDOMEN PELVIS W CONTRAST  Result Date: 03/26/2021 CLINICAL DATA:  Abdominal pain and constipation for the past 4 days. EXAM: CT ABDOMEN AND PELVIS WITH CONTRAST TECHNIQUE: Multidetector CT imaging of the abdomen and pelvis was performed using the standard protocol following bolus administration of intravenous contrast. CONTRAST:  27mL OMNIPAQUE IOHEXOL 300 MG/ML  SOLN COMPARISON:  CT abdomen pelvis dated August 31, 2020. FINDINGS: Lower chest: No acute abnormality. Hepatobiliary: No focal liver abnormality. The gallbladder is unremarkable. Stable mild intra and extrahepatic biliary dilatation. Pancreas: Unchanged mildly prominent main pancreatic duct. No surrounding inflammatory changes. Spleen: Normal in size without focal abnormality. Adrenals/Urinary Tract: Adrenal glands are unremarkable. Kidneys are normal, without renal calculi, focal lesion, or hydronephrosis. Bladder is unremarkable. Stomach/Bowel: Stomach is within normal limits. Mild gaseous distention of several small bowel loops of the colon. No obstruction, bowel wall thickening, or inflammatory changes. Vascular/Lymphatic: Aortic atherosclerosis. Unchanged ectasia of the left common iliac artery measuring up to 1.7 cm in diameter. No enlarged abdominal or pelvic lymph nodes. Reproductive: Unchanged prostatomegaly. Other: Unchanged tiny fat containing umbilical hernia. No free fluid or pneumoperitoneum. Musculoskeletal: No acute or significant osseous findings. Chronic mild L1 superior endplate compression deformity. IMPRESSION:  1. No acute intra-abdominal process. 2. Findings suggestive of mild ileus. 3. Aortic Atherosclerosis (ICD10-I70.0). Electronically Signed   By: Obie Dredge M.D.   On: 03/26/2021 14:49   DG Abd Portable 2V  Result Date: 03/26/2021 CLINICAL DATA:  Constipation. EXAM: PORTABLE ABDOMEN - 2 VIEW COMPARISON:  CT abdomen pelvis dated August 31, 2020. FINDINGS: Diffuse air-filled small bowel without evidence of obstruction. Air and stool throughout the colon. There is no evidence of free air. No radio-opaque calculi or other significant radiographic abnormality is  seen. No acute osseous abnormality. IMPRESSION: 1. No acute findings. Electronically Signed   By: Obie Dredge M.D.   On: 03/26/2021 12:44    Procedures Procedures   Medications Ordered in ED Medications  sodium chloride 0.9 % bolus 1,000 mL (1,000 mLs Intravenous New Bag/Given 03/26/21 1249)  iohexol (OMNIPAQUE) 300 MG/ML solution 75 mL (75 mLs Intravenous Contrast Given 03/26/21 1408)  sodium phosphate (FLEET) 7-19 GM/118ML enema 1 enema (1 enema Rectal Given 03/26/21 1532)    ED Course  I have reviewed the triage vital signs and the nursing notes.  Pertinent labs & imaging results that were available during my care of the patient were reviewed by me and considered in my medical decision making (see chart for details).    MDM Rules/Calculators/A&P                          Pt is feeling better after fluids.  He does have some constipation and a fleet enema ordered.  Pt signed out to Dr. Audley Hose at shift change. Final Clinical Impression(s) / ED Diagnoses Final diagnoses:  Ileus (HCC)  Constipation, unspecified constipation type    Rx / DC Orders ED Discharge Orders          Ordered    polyethylene glycol (MIRALAX) 17 g packet  Daily        03/26/21 1541             Jacalyn Lefevre, MD 03/26/21 1542

## 2021-03-26 NOTE — ED Provider Notes (Signed)
And a fleets enema done and subsequent had a large bowel movement feeling much better.  Discharged home in stable condition.  Advised up with his doctor within the week advised me to return for worsening symptoms or pain or fevers vomiting or any additional concerns.    Cheryll Cockayne, MD 03/26/21 [phone]

## 2021-03-29 ENCOUNTER — Other Ambulatory Visit: Payer: Self-pay

## 2021-03-29 ENCOUNTER — Encounter (HOSPITAL_COMMUNITY): Payer: Self-pay | Admitting: Emergency Medicine

## 2021-03-29 ENCOUNTER — Emergency Department (HOSPITAL_COMMUNITY): Payer: Medicare HMO

## 2021-03-29 ENCOUNTER — Emergency Department (HOSPITAL_COMMUNITY)
Admission: EM | Admit: 2021-03-29 | Discharge: 2021-03-29 | Disposition: A | Discharge status: Home / Self Care | Payer: Medicare HMO | Attending: Emergency Medicine | Admitting: Emergency Medicine

## 2021-03-29 DIAGNOSIS — G319 Degenerative disease of nervous system, unspecified: Secondary | ICD-10-CM | POA: Diagnosis not present

## 2021-03-29 DIAGNOSIS — R4182 Altered mental status, unspecified: Secondary | ICD-10-CM | POA: Diagnosis not present

## 2021-03-29 DIAGNOSIS — Z7982 Long term (current) use of aspirin: Secondary | ICD-10-CM | POA: Insufficient documentation

## 2021-03-29 DIAGNOSIS — Z043 Encounter for examination and observation following other accident: Secondary | ICD-10-CM | POA: Diagnosis present

## 2021-03-29 DIAGNOSIS — W108XXA Fall (on) (from) other stairs and steps, initial encounter: Secondary | ICD-10-CM | POA: Diagnosis not present

## 2021-03-29 DIAGNOSIS — N183 Chronic kidney disease, stage 3 unspecified: Secondary | ICD-10-CM | POA: Insufficient documentation

## 2021-03-29 DIAGNOSIS — R55 Syncope and collapse: Secondary | ICD-10-CM | POA: Diagnosis not present

## 2021-03-29 DIAGNOSIS — Z7902 Long term (current) use of antithrombotics/antiplatelets: Secondary | ICD-10-CM | POA: Insufficient documentation

## 2021-03-29 DIAGNOSIS — R404 Transient alteration of awareness: Secondary | ICD-10-CM | POA: Diagnosis not present

## 2021-03-29 DIAGNOSIS — Y92009 Unspecified place in unspecified non-institutional (private) residence as the place of occurrence of the external cause: Secondary | ICD-10-CM | POA: Insufficient documentation

## 2021-03-29 DIAGNOSIS — R509 Fever, unspecified: Secondary | ICD-10-CM | POA: Diagnosis not present

## 2021-03-29 DIAGNOSIS — S0990XA Unspecified injury of head, initial encounter: Secondary | ICD-10-CM | POA: Diagnosis not present

## 2021-03-29 DIAGNOSIS — I959 Hypotension, unspecified: Secondary | ICD-10-CM | POA: Diagnosis not present

## 2021-03-29 DIAGNOSIS — R5383 Other fatigue: Secondary | ICD-10-CM | POA: Diagnosis not present

## 2021-03-29 DIAGNOSIS — Z79899 Other long term (current) drug therapy: Secondary | ICD-10-CM | POA: Diagnosis not present

## 2021-03-29 DIAGNOSIS — W19XXXA Unspecified fall, initial encounter: Secondary | ICD-10-CM

## 2021-03-29 DIAGNOSIS — R410 Disorientation, unspecified: Secondary | ICD-10-CM | POA: Diagnosis not present

## 2021-03-29 LAB — URINALYSIS, ROUTINE W REFLEX MICROSCOPIC
Bilirubin Urine: NEGATIVE
Glucose, UA: NEGATIVE mg/dL
Hgb urine dipstick: NEGATIVE
Ketones, ur: 5 mg/dL — AB
Leukocytes,Ua: NEGATIVE
Nitrite: NEGATIVE
Protein, ur: NEGATIVE mg/dL
Specific Gravity, Urine: 1.021 (ref 1.005–1.030)
pH: 5 (ref 5.0–8.0)

## 2021-03-29 LAB — CBC WITH DIFFERENTIAL/PLATELET
Abs Immature Granulocytes: 0.03 10*3/uL (ref 0.00–0.07)
Basophils Absolute: 0 10*3/uL (ref 0.0–0.1)
Basophils Relative: 1 %
Eosinophils Absolute: 0.1 10*3/uL (ref 0.0–0.5)
Eosinophils Relative: 1 %
HCT: 37.1 % — ABNORMAL LOW (ref 39.0–52.0)
Hemoglobin: 12.5 g/dL — ABNORMAL LOW (ref 13.0–17.0)
Immature Granulocytes: 0 %
Lymphocytes Relative: 11 %
Lymphs Abs: 0.9 10*3/uL (ref 0.7–4.0)
MCH: 31.7 pg (ref 26.0–34.0)
MCHC: 33.7 g/dL (ref 30.0–36.0)
MCV: 94.2 fL (ref 80.0–100.0)
Monocytes Absolute: 0.6 10*3/uL (ref 0.1–1.0)
Monocytes Relative: 7 %
Neutro Abs: 6.6 10*3/uL (ref 1.7–7.7)
Neutrophils Relative %: 80 %
Platelets: 127 10*3/uL — ABNORMAL LOW (ref 150–400)
RBC: 3.94 MIL/uL — ABNORMAL LOW (ref 4.22–5.81)
RDW: 13.7 % (ref 11.5–15.5)
WBC: 8.2 10*3/uL (ref 4.0–10.5)
nRBC: 0 % (ref 0.0–0.2)

## 2021-03-29 LAB — BASIC METABOLIC PANEL
Anion gap: 9 (ref 5–15)
BUN: 21 mg/dL (ref 8–23)
CO2: 23 mmol/L (ref 22–32)
Calcium: 8.8 mg/dL — ABNORMAL LOW (ref 8.9–10.3)
Chloride: 109 mmol/L (ref 98–111)
Creatinine, Ser: 1 mg/dL (ref 0.61–1.24)
GFR, Estimated: >60 mL/min (ref >60)
Glucose, Bld: 101 mg/dL — ABNORMAL HIGH (ref 70–99)
Potassium: 3.5 mmol/L (ref 3.5–5.1)
Sodium: 141 mmol/L (ref 135–145)

## 2021-03-29 LAB — CBG MONITORING, ED: Glucose-Capillary: 98 mg/dL (ref 70–99)

## 2021-03-29 NOTE — ED Notes (Signed)
Ricke Hey, granddaughter,said he lives with her and she will pick him up when he gets discharge, [phone].

## 2021-03-29 NOTE — ED Triage Notes (Signed)
BIBA Per EMS: Pt coming from home with complaints of a fall and lethargy/AMS. Pt family states he was outside on the porch and then was found lying in the grass altered. Pt is much more alert and at baseline now. 20G L AC en route; 1L fluid bolus en route.

## 2021-03-29 NOTE — Discharge Instructions (Addendum)
You have been seen and discharged from the emergency department.  Follow-up with your primary provider for reevaluation and further care. Take home medications as prescribed. If you have any worsening symptoms or further concerns for your health please return to an emergency department for further evaluation. 

## 2021-03-29 NOTE — ED Provider Notes (Signed)
[facility]-EMERGENCY DEPT Provider Note   CSN: [id number] Arrival date & time: 03/29/21  1619     History Chief Complaint  Patient presents with   Fall   Altered Mental Status    Charles Morgan is a 87 y.o. male.  HPI  87 year old male with past medical history of previous CVA with residual speech difficulty, vertebrobasilar artery insufficiency, DVT not noted to be on anticoagulation, multiple recurrences of syncope/collapse presents to the emergency department with concern for fall.  Patient is oriented, sometimes difficult to understand but appears appropriate.  There is no family at bedside.  The patient reports that he currently lives in a nursing facility but he was visiting his daughter's house today for the holiday.  He was on the back porch when he decided to go down to the grass, there is a couple steps and he states that he fell down onto the grass.  He denies any injury, did not hit his head, denied any loss of consciousness.  He denies to me syncope, chest pain, palpitations, lightheadedness, shortness of breath.  Past Medical History:  Diagnosis Date   Aortic stenosis    Arthritis    Bradycardia, sinus 2021   Nocturnal bradycardia, not clearly symptomatic or associated with syncope   CVA (cerebral vascular accident) (HCC)    Two events: 2013, 2019   DVT (deep venous thrombosis) (HCC) Aug '20   right LE   HLD (hyperlipidemia)    NSVT (nonsustained ventricular tachycardia) (HCC)    LVEF normal, not associated with syncope   Proctocolitis without complication Aug '21   CT diagnosis   Syncope and collapse    Multiple recurrences - vertebrobasilar insufficiency, autonomic dysfunction, ILR placed by cardiologist in [place]   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 Neurology eval - no need for further w/u   Vertebrobasilar artery insufficiency     Patient Active Problem List   Diagnosis Date Noted   Corns and callus  11/27/2020   Primary open angle glaucoma 11/27/2020   Pain in wrist 11/27/2020   Onychomycosis 11/27/2020   Old myocardial infarction 11/27/2020   Occupational maladjustment 11/27/2020   Nonspecific reaction to tuberculin skin test 11/27/2020   Neuralgia 11/27/2020   Microscopic hematuria 11/27/2020   Metabolic encephalopathy 11/27/2020   Lung field abnormal 11/27/2020   Localized, primary osteoarthritis of hand 11/27/2020   Intracranial hemorrhage (HCC) 11/27/2020   Inguinal hernia 11/27/2020   Impacted cerumen 11/27/2020   Housing lack 11/27/2020   History of thrombocytopenia 11/27/2020   History of fall 11/27/2020   Groin mass 11/27/2020   Esophageal reflux 11/27/2020   Edema 11/27/2020   Dysphagia 11/27/2020   Cocaine abuse, episodic use (HCC) 11/27/2020   Constipation 11/27/2020   Disorder of oral mucous membrane 11/27/2020   Duodenal ulcer without hemorrhage, perforation, or obstruction 11/27/2020   Benign prostatic hyperplasia 11/27/2020   Atopic dermatitis 11/27/2020   Allergic rhinitis 11/27/2020   Alcohol abuse 11/27/2020   Acute pharyngitis 11/27/2020   Bilateral hand pain 11/27/2020   Mild cognitive impairment 11/27/2020   Neuropraxia of right upper extremity, initial encounter 09/04/2020   Lactic acidosis 09/01/2020   Hypoalbuminemia 09/01/2020   Hyperammonemia (HCC) 09/01/2020   Altered mental status 08/31/2020   CKD (chronic kidney disease) stage 3, GFR 30-59 ml/min (HCC) 06/05/2020   Basilar artery stenosis 06/05/2020   Transaminasemia 06/05/2020   Thrombocytopenia (HCC) 06/05/2020   Syncope 06/04/2020   Dehydration 06/04/2020   AKI (acute kidney  injury) (HCC) 06/04/2020   HLD (hyperlipidemia) 06/04/2020   Calcification of aortic valve 06/04/2020   Syncope and collapse 06/04/2020   Ventricular tachycardia (HCC) 02/18/2020   Adrenal insufficiency (Addison's disease) (HCC) 01/26/2020   Ambulatory dysfunction 01/26/2020   Severe muscle deconditioning  01/12/2020   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   Elevated AST (SGOT) 01/11/2020   History of stroke 01/10/2020   Epigastric pain 12/06/2018   Stroke (HCC) 08/31/2017   Near syncope 11/04/2015    Past Surgical History:  Procedure Laterality Date   ABDOMINAL AORTOGRAM W/LOWER EXTREMITY N/A 11/02/2020   Procedure: ABDOMINAL AORTOGRAM W/LOWER EXTREMITY;  Surgeon: Maeola Harmanain, Brandon Christopher, MD;  [facility];  Service: Cardiovascular;  Laterality: N/A;  Bilatera;   ABDOMINAL SURGERY     FLEXIBLE SIGMOIDOSCOPY N/A 06/05/2020   Procedure: FLEXIBLE SIGMOIDOSCOPY;  Surgeon: Malissa Hippoehman, Najeeb U, MD;  Location[facility];  Service: Endoscopy;  Laterality: N/A;   HEMORRHOIDECTOMY WITH HEMORRHOID BANDING     LOOP RECORDER IMPLANT     PERIPHERAL VASCULAR ATHERECTOMY  11/02/2020   Procedure: PERIPHERAL VASCULAR ATHERECTOMY;  Surgeon: Maeola Harmanain, Brandon Christopher, MD;  [facility];  Service: Cardiovascular;;  [place] - AT    SHOULDER SURGERY     Thumb surgery         Family History  Family history unknown: Yes    Social History   Tobacco Use   Smoking status: Never   Smokeless tobacco: Never  Vaping Use   Vaping Use: Never used  Substance Use Topics   Alcohol use: Never   Drug use: Never    Home Medications Prior to Admission medications   Medication Sig Start Date End Date Taking? Authorizing Provider  acetaminophen (TYLENOL) 325 MG tablet Take 325 mg by mouth every 6 (six) hours as needed.  10/03/19   [provider]  ammonium lactate (LAC-HYDRIN) 12 % lotion Apply 1 application topically as needed for dry skin.  03/10/20   [provider]  aspirin 81 MG chewable tablet Chew 81 mg by mouth daily.    [provider]  atorvastatin (LIPITOR) 80 MG tablet Take 1 tablet (80 mg total) by mouth daily. 06/06/20   Dahal, Melina SchoolsBinaya, MD  Brinzolamide-Brimonidine 1-0.2 % SUSP Place 1 drop into both eyes in the morning and at bedtime.  12/12/19    [provider]  clopidogrel (PLAVIX) 75 MG tablet Take 1 tablet (75 mg total) by mouth daily. 11/02/20 11/02/21  Maeola Harmanain, Brandon Christopher, MD  docusate sodium (COLACE) 100 MG capsule Take 100 mg by mouth 2 (two) times daily.    [provider]  lactulose (CHRONULAC) 10 GM/15ML solution Take 30 mLs (20 g total) by mouth 2 (two) times daily as needed for mild constipation or moderate constipation. 09/04/20   Dorcas CarrowGhimire, Kuber, MD  mupirocin ointment (BACTROBAN) 2 % Apply 1 application topically 2 (two) times daily. 10/06/20   McDonald, Rachelle HoraAdam R, DPM  oxyCODONE-acetaminophen (PERCOCET/ROXICET) 5-325 MG tablet Take 1-2 tablets by mouth every 4 (four) hours as needed for severe pain. 11/27/20   McDonald, Rachelle HoraAdam R, DPM  polyethylene glycol (MIRALAX) 17 g packet Take 17 g by mouth daily. 03/26/21   Jacalyn LefevreHaviland, Julie, MD    Allergies    Tuberculin  Review of Systems   Review of Systems  Constitutional:  Positive for fatigue. Negative for chills and fever.  Eyes:  Negative for visual disturbance.  Respiratory:  Negative for shortness of breath.   Cardiovascular:  Negative for chest pain.  Gastrointestinal:  Negative for abdominal pain, diarrhea and vomiting.  Genitourinary:  Negative for dysuria.  Musculoskeletal:  Negative for back pain, joint swelling and neck pain.  Skin:  Negative for rash.  Neurological:  Negative for headaches.  Psychiatric/Behavioral:  Positive for confusion.    Physical Exam Updated Vital Signs BP (!) 158/64   Pulse 65   Temp 99.7 F (37.6 C) (Oral)   Resp 18   SpO2 100%   Physical Exam Vitals and nursing note reviewed.  Constitutional:      General: He is not in acute distress.    Appearance: Normal appearance.  HENT:     Head: Normocephalic.     Right Ear: External ear normal.     Left Ear: External ear normal.     Nose: Nose normal.     Mouth/Throat:     Mouth: Mucous membranes are moist.  Eyes:     Extraocular Movements: Extraocular movements  intact.     Pupils: Pupils are equal, round, and reactive to light.  Cardiovascular:     Rate and Rhythm: Normal rate.  Pulmonary:     Effort: Pulmonary effort is normal. No respiratory distress.  Abdominal:     Palpations: Abdomen is soft.     Tenderness: There is no abdominal tenderness. There is no guarding or rebound.  Musculoskeletal:        General: No swelling, tenderness, deformity or signs of injury.     Cervical back: No rigidity or tenderness.  Skin:    General: Skin is warm.  Neurological:     Mental Status: He is alert and oriented to person, place, and time. Mental status is at baseline.  Psychiatric:        Mood and Affect: Mood normal.    ED Results / Procedures / Treatments   Labs (all labs ordered are listed, but only abnormal results are displayed) Labs Reviewed  CBC WITH DIFFERENTIAL/PLATELET - Abnormal; Notable for the following components:      Result Value   RBC 3.94 (*)    Hemoglobin 12.5 (*)    HCT 37.1 (*)    Platelets 127 (*)    All other components within normal limits  BASIC METABOLIC PANEL  URINALYSIS, ROUTINE W REFLEX MICROSCOPIC  CBG MONITORING, ED    EKG None  Radiology No results found.  Procedures Procedures   Medications Ordered in ED Medications - No data to display  ED Course  I have reviewed the triage vital signs and the nursing notes.  Pertinent labs & imaging results that were available during my care of the patient were reviewed by me and considered in my medical decision making (see chart for details).    MDM Rules/Calculators/A&P                          87 year old male presents emergency department with what appears to be a mechanical fall.  Patient states he remembers walking down the porch steps and falling onto the grass.  Does not appear to be syncope.  EKG is sinus rhythm, vitals are otherwise stable.  He has no complaint.  Blood work is baseline, urinalysis is negative, CT of the head is unremarkable.  He  has no other musculoskeletal injury.  He appears to be baseline, offers no complaints and is happy to be discharged home.  Patient will be discharged and treated as an outpatient.  Discharge plan and strict return to ED precautions discussed, patient verbalizes  understanding and agreement.   Final Clinical Impression(s) / ED Diagnoses Final diagnoses:  None    Rx / DC Orders ED Discharge Orders     None        Rozelle Logan, DO 03/29/21 2050

## 2021-03-29 NOTE — ED Notes (Signed)
Patient given cranberry juice

## 2021-03-29 NOTE — ED Notes (Signed)
Pt transported to CT ?

## 2021-03-29 NOTE — ED Notes (Signed)
Ricke Hey called to come pick patient up as he is discharged.

## 2021-04-15 DIAGNOSIS — E2740 Unspecified adrenocortical insufficiency: Secondary | ICD-10-CM | POA: Diagnosis not present

## 2021-04-15 DIAGNOSIS — I700 Atherosclerosis of aorta: Secondary | ICD-10-CM | POA: Diagnosis not present

## 2021-04-15 DIAGNOSIS — E559 Vitamin D deficiency, unspecified: Secondary | ICD-10-CM | POA: Diagnosis not present

## 2021-04-15 DIAGNOSIS — E43 Unspecified severe protein-calorie malnutrition: Secondary | ICD-10-CM | POA: Diagnosis not present

## 2021-04-15 DIAGNOSIS — E785 Hyperlipidemia, unspecified: Secondary | ICD-10-CM | POA: Diagnosis not present

## 2021-04-15 DIAGNOSIS — F0151 Vascular dementia with behavioral disturbance: Secondary | ICD-10-CM | POA: Diagnosis not present

## 2021-04-15 DIAGNOSIS — F1021 Alcohol dependence, in remission: Secondary | ICD-10-CM | POA: Diagnosis not present

## 2021-04-15 DIAGNOSIS — D84821 Immunodeficiency due to drugs: Secondary | ICD-10-CM | POA: Diagnosis not present

## 2021-04-15 DIAGNOSIS — I5032 Chronic diastolic (congestive) heart failure: Secondary | ICD-10-CM | POA: Diagnosis not present

## 2021-04-23 ENCOUNTER — Other Ambulatory Visit: Payer: Self-pay

## 2021-04-23 ENCOUNTER — Emergency Department (HOSPITAL_BASED_OUTPATIENT_CLINIC_OR_DEPARTMENT_OTHER)
Admission: EM | Admit: 2021-04-23 | Discharge: 2021-04-30 | Disposition: A | Discharge status: Home / Self Care | Payer: Medicare HMO | Attending: Emergency Medicine | Admitting: Emergency Medicine

## 2021-04-23 ENCOUNTER — Emergency Department (HOSPITAL_BASED_OUTPATIENT_CLINIC_OR_DEPARTMENT_OTHER): Payer: Medicare HMO

## 2021-04-23 ENCOUNTER — Encounter (HOSPITAL_BASED_OUTPATIENT_CLINIC_OR_DEPARTMENT_OTHER): Payer: Self-pay | Admitting: Emergency Medicine

## 2021-04-23 DIAGNOSIS — S0990XA Unspecified injury of head, initial encounter: Secondary | ICD-10-CM | POA: Diagnosis not present

## 2021-04-23 DIAGNOSIS — I959 Hypotension, unspecified: Secondary | ICD-10-CM | POA: Diagnosis not present

## 2021-04-23 DIAGNOSIS — M542 Cervicalgia: Secondary | ICD-10-CM | POA: Insufficient documentation

## 2021-04-23 DIAGNOSIS — Z043 Encounter for examination and observation following other accident: Secondary | ICD-10-CM | POA: Diagnosis not present

## 2021-04-23 DIAGNOSIS — M160 Bilateral primary osteoarthritis of hip: Secondary | ICD-10-CM | POA: Diagnosis not present

## 2021-04-23 DIAGNOSIS — M47812 Spondylosis without myelopathy or radiculopathy, cervical region: Secondary | ICD-10-CM | POA: Diagnosis not present

## 2021-04-23 DIAGNOSIS — M25551 Pain in right hip: Secondary | ICD-10-CM | POA: Insufficient documentation

## 2021-04-23 DIAGNOSIS — Z7982 Long term (current) use of aspirin: Secondary | ICD-10-CM | POA: Insufficient documentation

## 2021-04-23 DIAGNOSIS — W19XXXA Unspecified fall, initial encounter: Secondary | ICD-10-CM | POA: Diagnosis not present

## 2021-04-23 DIAGNOSIS — Z7902 Long term (current) use of antithrombotics/antiplatelets: Secondary | ICD-10-CM | POA: Diagnosis not present

## 2021-04-23 DIAGNOSIS — I129 Hypertensive chronic kidney disease with stage 1 through stage 4 chronic kidney disease, or unspecified chronic kidney disease: Secondary | ICD-10-CM | POA: Insufficient documentation

## 2021-04-23 DIAGNOSIS — R000 Tachycardia, unspecified: Secondary | ICD-10-CM | POA: Diagnosis not present

## 2021-04-23 DIAGNOSIS — M25552 Pain in left hip: Secondary | ICD-10-CM | POA: Diagnosis not present

## 2021-04-23 DIAGNOSIS — Z20822 Contact with and (suspected) exposure to covid-19: Secondary | ICD-10-CM | POA: Diagnosis not present

## 2021-04-23 DIAGNOSIS — R4182 Altered mental status, unspecified: Secondary | ICD-10-CM | POA: Insufficient documentation

## 2021-04-23 DIAGNOSIS — R0902 Hypoxemia: Secondary | ICD-10-CM | POA: Diagnosis not present

## 2021-04-23 DIAGNOSIS — R519 Headache, unspecified: Secondary | ICD-10-CM | POA: Diagnosis not present

## 2021-04-23 DIAGNOSIS — N183 Chronic kidney disease, stage 3 unspecified: Secondary | ICD-10-CM | POA: Diagnosis not present

## 2021-04-23 DIAGNOSIS — M549 Dorsalgia, unspecified: Secondary | ICD-10-CM | POA: Diagnosis not present

## 2021-04-23 LAB — URINALYSIS, ROUTINE W REFLEX MICROSCOPIC
Bilirubin Urine: NEGATIVE
Glucose, UA: NEGATIVE mg/dL
Hgb urine dipstick: NEGATIVE
Ketones, ur: NEGATIVE mg/dL
Leukocytes,Ua: NEGATIVE
Nitrite: NEGATIVE
Specific Gravity, Urine: 1.019 (ref 1.005–1.030)
pH: 6.5 (ref 5.0–8.0)

## 2021-04-23 LAB — CBC WITH DIFFERENTIAL/PLATELET
Abs Immature Granulocytes: 0.02 10*3/uL (ref 0.00–0.07)
Basophils Absolute: 0.1 10*3/uL (ref 0.0–0.1)
Basophils Relative: 1 %
Eosinophils Absolute: 0.2 10*3/uL (ref 0.0–0.5)
Eosinophils Relative: 3 %
HCT: 39.2 % (ref 39.0–52.0)
Hemoglobin: 13 g/dL (ref 13.0–17.0)
Immature Granulocytes: 0 %
Lymphocytes Relative: 24 %
Lymphs Abs: 1.6 10*3/uL (ref 0.7–4.0)
MCH: 31.1 pg (ref 26.0–34.0)
MCHC: 33.2 g/dL (ref 30.0–36.0)
MCV: 93.8 fL (ref 80.0–100.0)
Monocytes Absolute: 0.5 10*3/uL (ref 0.1–1.0)
Monocytes Relative: 8 %
Neutro Abs: 4.2 10*3/uL (ref 1.7–7.7)
Neutrophils Relative %: 64 %
Platelets: 115 10*3/uL — ABNORMAL LOW (ref 150–400)
RBC: 4.18 MIL/uL — ABNORMAL LOW (ref 4.22–5.81)
RDW: 13.6 % (ref 11.5–15.5)
WBC: 6.6 10*3/uL (ref 4.0–10.5)
nRBC: 0 % (ref 0.0–0.2)

## 2021-04-23 LAB — BASIC METABOLIC PANEL
Anion gap: 8 (ref 5–15)
BUN: 10 mg/dL (ref 8–23)
CO2: 25 mmol/L (ref 22–32)
Calcium: 8.9 mg/dL (ref 8.9–10.3)
Chloride: 105 mmol/L (ref 98–111)
Creatinine, Ser: 1.02 mg/dL (ref 0.61–1.24)
GFR, Estimated: >60 mL/min (ref >60)
Glucose, Bld: 93 mg/dL (ref 70–99)
Potassium: 3.7 mmol/L (ref 3.5–5.1)
Sodium: 138 mmol/L (ref 135–145)

## 2021-04-23 NOTE — ED Notes (Signed)
On phone with social worker
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
## 2021-04-23 NOTE — ED Notes (Signed)
Pt states that he is no longer in any pain at this time but complains of being cold warm blanket given

## 2021-04-23 NOTE — ED Notes (Signed)
ED Provider at bedside. 
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
## 2021-04-23 NOTE — ED Triage Notes (Signed)
Pt. Arrived via EMS for a fall. At approx 1550 hrs. Pt attempted to walk without a walker and fell backwards onto his buttocks and then to his back and hit his head on the floor.   Unwitnessed by family but heard and family present after fall occurred.   Pt is NOT on thinner.  Pt denis LOC.  Pt denis N/V  Pt legally blind in left eye but can see in right eye

## 2021-04-24 DIAGNOSIS — I10 Essential (primary) hypertension: Secondary | ICD-10-CM | POA: Diagnosis not present

## 2021-04-24 DIAGNOSIS — R404 Transient alteration of awareness: Secondary | ICD-10-CM | POA: Diagnosis not present

## 2021-04-24 LAB — RESP PANEL BY RT-PCR (FLU A&B, COVID) ARPGX2
Influenza A by PCR: NEGATIVE
Influenza B by PCR: NEGATIVE
SARS Coronavirus 2 by RT PCR: NEGATIVE

## 2021-04-24 MED ORDER — ZIPRASIDONE MESYLATE 20 MG IM SOLR
20.0000 mg | INTRAMUSCULAR | Status: AC | PRN
Start: 2021-04-24 — End: 2021-04-26
  Administered 2021-04-26: 20 mg via INTRAMUSCULAR
  Filled 2021-04-24: qty 20

## 2021-04-24 MED ORDER — LORAZEPAM 1 MG PO TABS
1.0000 mg | ORAL_TABLET | ORAL | Status: AC | PRN
  Administered 2021-04-28: 1 mg via ORAL
  Filled 2021-04-24: qty 1

## 2021-04-24 MED ORDER — OLANZAPINE 10 MG PO TBDP: 10.0000 mg | ORAL_TABLET | Freq: Three times a day (TID) | ORAL | Status: DC | PRN
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
## 2021-04-24 NOTE — ED Notes (Signed)
Pt has been given crackers and food throughout the day by RT Luisa Hart. Pt was given a meatloaf dinnner with ensure and did well eating. Pt stated the meal was good and he felt fine. The Pt seems to be in much better spirits than yesterday

## 2021-04-24 NOTE — ED Notes (Signed)
Spoke to pt who stated that he felt fine and was doing ok at this time 
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
## 2021-04-24 NOTE — ED Notes (Signed)
Patient provided with tray, milk, and a cup of decaf coffee

## 2021-04-24 NOTE — ED Notes (Addendum)
Patient arrives via EMS from [facility] as a transfer for possible SNF placement.

## 2021-04-24 NOTE — ED Notes (Signed)
Patient is resting comfortably, watching tv.
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
## 2021-04-24 NOTE — ED Notes (Signed)
Pt resting well. Normal breathing pattern with normal rise and fall of the chest

## 2021-04-24 NOTE — ED Notes (Signed)
Iv site looked slightly swollen. Site was not hot to touch and skin color looked WDL for Pt. Pt denied any pain. MD notifed to look over site

## 2021-04-24 NOTE — ED Notes (Signed)
RT Note: Pt. seen for RN for rounding check, resting in no apparent distress or c/o, offered/accepted PB crackers with milk carton, agreed to watch tv, made aware to notify if needed with call bell, location made aware of.

## 2021-04-25 NOTE — ED Notes (Signed)
Pt refuses to wear a gown or have cloth on.

## 2021-04-25 NOTE — ED Notes (Signed)
Assumed care of pt. Pt resting and in no apparent distress or pain. Awaiting placement.
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
## 2021-04-25 NOTE — Evaluation (Addendum)
Physical Therapy Evaluation Patient Details Name: Charles Morgan MRN: [id number] DOB: Jun 08, 1930 Today's Date: 04/25/2021   History of Present Illness  87 yo male admitted after falling at home. Family is unable to continue to care for him. Hx of vertebrobasilar insufficiency, syncope, CVA, autonomic dysfunction, DVT, legally blind, MI, polysubstance abuse  Clinical Impression  Limited eval. On PT arrival, pt sitting at corner edge of stretcher sifting through belongings on floor. Explained reason for PT presence and attempted to get pt to participate. He was distracted and only wanted to do things he wanted to do at this time. Pt attempting to remove t-shirt (which is only thing he is wearing). Asked pt to put on gown, but he refused. Called out for nursing assistance to try to get pt to don gown and to assist pt back onto stretcher. NT in to help but pt resistant to his attempts "I don't need your help. No, Im not putting that on." Pt continued to refuse and became combative with NT. Assisted NT with positioning pt fully onto stretcher for safety. Alarm placed under pt for safety.   Unfortunately, unable to completely assess pt's mobility due to his unwillingness to participate. Will attempt to follow during hospital stay and progress activity as pt will allow.     Follow Up Recommendations SNF    Equipment Recommendations  None recommended by PT    Recommendations for Other Services       Precautions / Restrictions Precautions Precautions: Fall Precaution Comments: agitated;combative Restrictions Weight Bearing Restrictions: No      Mobility  Bed Mobility Overal bed mobility: Needs Assistance Bed Mobility: Sit to Supine       Sit to supine: Total assist;+2 for physical assistance;+2 for safety/equipment   General bed mobility comments: +2 from PT and NT to scoot pt back into supine onto stretcher. Pt resistant, agitated.    Transfers                 General  transfer comment: NT-pt not willing to cooperate  Ambulation/Gait                Stairs            Wheelchair Mobility    Modified Rankin (Stroke Patients Only)       Balance Overall balance assessment: Needs assistance Sitting-balance support: No upper extremity supported;Feet supported Sitting balance-Leahy Scale: Fair                                       Pertinent Vitals/Pain Pain Assessment: Faces Faces Pain Scale: No hurt    Home Living Family/patient expects to be discharged to:: Unsure Living Arrangements: Other relatives Available Help at Discharge: Family;Available PRN/intermittently Type of Home: House Home Access: Stairs to enter Entrance Stairs-Rails: None Entrance Stairs-Number of Steps: 4 Home Layout: Two level Home Equipment: Walker - 2 wheels;Shower seat;Wheelchair - manual      Prior Function Level of Independence: Needs assistance   Gait / Transfers Assistance Needed: household and short distanced community ambulator without AD  ADL's / Homemaking Assistance Needed: granddaughter assists with housekeeping and meal preparation, independent in ADLs  Comments: info taken from previous admission     Hand Dominance        Extremity/Trunk Assessment   Upper Extremity Assessment Upper Extremity Assessment: Overall WFL for tasks assessed    Lower Extremity Assessment Lower Extremity Assessment: Generalized weakness;Difficult  to assess due to impaired cognition (pt unwilling to participate)    Cervical / Trunk Assessment Cervical / Trunk Assessment: Kyphotic  Communication   Communication: No difficulties  Cognition Arousal/Alertness: Awake/alert Behavior During Therapy: Agitated (combative) Overall Cognitive Status: No family/caregiver present to determine baseline cognitive functioning                                        General Comments      Exercises     Assessment/Plan    PT  Assessment Patient needs continued PT services  PT Problem List Decreased strength;Decreased mobility;Decreased activity tolerance;Decreased balance;Decreased cognition;Decreased knowledge of use of DME;Decreased safety awareness       PT Treatment Interventions DME instruction;Gait training;Therapeutic activities;Therapeutic exercise;Patient/family education;Balance training;Functional mobility training    PT Goals (Current goals can be found in the Care Plan section)  Acute Rehab PT Goals Patient Stated Goal: none stated PT Goal Formulation: Patient unable to participate in goal setting Time For Goal Achievement: 05/09/21 Potential to Achieve Goals: Fair    Frequency Min 2X/week   Barriers to discharge        Co-evaluation               AM-PAC PT "6 Clicks" Mobility  Outcome Measure Help needed turning from your back to your side while in a flat bed without using bedrails?: Total Help needed moving from lying on your back to sitting on the side of a flat bed without using bedrails?: Total Help needed moving to and from a bed to a chair (including a wheelchair)?: Total Help needed standing up from a chair using your arms (e.g., wheelchair or bedside chair)?: Total Help needed to walk in hospital room?: Total Help needed climbing 3-5 steps with a railing? : Total 6 Click Score: 6    End of Session   Activity Tolerance: Treatment limited secondary to agitation Patient left: in bed;with call bell/phone within reach;with bed alarm set;with nursing/sitter in room   PT Visit Diagnosis: Muscle weakness (generalized) (M62.81);History of falling (Z91.81);Other abnormalities of gait and mobility (R26.89)    Time: 3825-0539 PT Time Calculation (min) (ACUTE ONLY): 12 min   Charges:   PT Evaluation $PT Eval Moderate Complexity: 1 Mod             Faye Ramsay, PT Acute Rehabilitation  Office: [phone] Pager: [phone]

## 2021-04-25 NOTE — NC FL2 (Addendum)
[place] MEDICAID FL2 LEVEL OF CARE SCREENING TOOL     IDENTIFICATION  Patient Name: Charles Morgan Birthdate: 1930/07/08 Sex: male Admission Date (Current Location): 04/23/2021  [facility] and [place] Number:  Producer, television/film/video and Address:  [facility],  [address] [place], [place] [address]      Provider Number: [id number]  Attending Physician Name and Address:  Default, Provider, MD  Relative Name and Phone Number:  Moataz Tavis [phone]    Current Level of Care: Hospital Recommended Level of Care: Skilled Nursing Facility Prior Approval Number:    Date Approved/Denied:   PASRR Number: [id number]  Discharge Plan: SNF    Current Diagnoses: Patient Active Problem List   Diagnosis Date Noted   Corns and callus 11/27/2020   Primary open angle glaucoma 11/27/2020   Pain in wrist 11/27/2020   Onychomycosis 11/27/2020   Old myocardial infarction 11/27/2020   Occupational maladjustment 11/27/2020   Nonspecific reaction to tuberculin skin test 11/27/2020   Neuralgia 11/27/2020   Microscopic hematuria 11/27/2020   Metabolic encephalopathy 11/27/2020   Lung field abnormal 11/27/2020   Localized, primary osteoarthritis of hand 11/27/2020   Intracranial hemorrhage (HCC) 11/27/2020   Inguinal hernia 11/27/2020   Impacted cerumen 11/27/2020   Housing lack 11/27/2020   History of thrombocytopenia 11/27/2020   History of fall 11/27/2020   Groin mass 11/27/2020   Esophageal reflux 11/27/2020   Edema 11/27/2020   Dysphagia 11/27/2020   Cocaine abuse, episodic use (HCC) 11/27/2020   Constipation 11/27/2020   Disorder of oral mucous membrane 11/27/2020   Duodenal ulcer without hemorrhage, perforation, or obstruction 11/27/2020   Benign prostatic hyperplasia 11/27/2020   Atopic dermatitis 11/27/2020   Allergic rhinitis 11/27/2020   Alcohol abuse 11/27/2020   Acute pharyngitis 11/27/2020   Bilateral hand pain 11/27/2020   Mild cognitive impairment  11/27/2020   Neuropraxia of right upper extremity, initial encounter 09/04/2020   Lactic acidosis 09/01/2020   Hypoalbuminemia 09/01/2020   Hyperammonemia (HCC) 09/01/2020   Altered mental status 08/31/2020   CKD (chronic kidney disease) stage 3, GFR 30-59 ml/min (HCC) 06/05/2020   Basilar artery stenosis 06/05/2020   Transaminasemia 06/05/2020   Thrombocytopenia (HCC) 06/05/2020   Syncope 06/04/2020   Dehydration 06/04/2020   AKI (acute kidney injury) (HCC) 06/04/2020   HLD (hyperlipidemia) 06/04/2020   Calcification of aortic valve 06/04/2020   Syncope and collapse 06/04/2020   Ventricular tachycardia (HCC) 02/18/2020   Adrenal insufficiency (Addison's disease) (HCC) 01/26/2020   Ambulatory dysfunction 01/26/2020   Severe muscle deconditioning 01/12/2020   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   Elevated AST (SGOT) 01/11/2020   History of stroke 01/10/2020   Epigastric pain 12/06/2018   Stroke (HCC) 08/31/2017   Near syncope 11/04/2015    Orientation RESPIRATION BLADDER Height & Weight     Self, Situation, Place    Continent Weight: 130 lb (59 kg) Height:  5\' 7"  (170.2 cm)  BEHAVIORAL SYMPTOMS/MOOD NEUROLOGICAL BOWEL NUTRITION STATUS      Continent Diet (Regular)  AMBULATORY STATUS COMMUNICATION OF NEEDS Skin   Supervision Verbally                         Personal Care Assistance Level of Assistance  Bathing, Feeding, Dressing Bathing Assistance: Limited assistance Feeding assistance: Independent Dressing Assistance: Limited assistance     Functional Limitations Info  Sight, Hearing, Speech Sight Info: Impaired (Legally Blind) Hearing Info: Adequate Speech Info: Adequate    SPECIAL CARE FACTORS FREQUENCY  PT (By  licensed PT), OT (By licensed OT)     PT Frequency: 5 x a week OT Frequency: 5 x a week            Contractures Contractures Info: Not present    Additional Factors Info  Allergies, Code Status Code Status Info: Full Code Allergies  Info: Tuberculin           Current Medications (04/25/2021):  This is the current hospital active medication list Current Facility-Administered Medications  Medication Dose Route Frequency Provider Last Rate Last Admin   OLANZapine zydis (ZYPREXA) disintegrating tablet 10 mg  10 mg Oral Q8H PRN Milagros Loll, MD       And   LORazepam (ATIVAN) tablet 1 mg  1 mg Oral PRN Milagros Loll, MD       And   ziprasidone (GEODON) injection 20 mg  20 mg Intramuscular PRN Milagros Loll, MD       Current Outpatient Medications  Medication Sig Dispense Refill   atorvastatin (LIPITOR) 80 MG tablet Take 1 tablet (80 mg total) by mouth daily.     Brinzolamide-Brimonidine 1-0.2 % SUSP Place 1 drop into both eyes in the morning and at bedtime.      clopidogrel (PLAVIX) 75 MG tablet Take 1 tablet (75 mg total) by mouth daily. (Patient not taking: Reported on 04/25/2021) 30 tablet 11   oxyCODONE-acetaminophen (PERCOCET/ROXICET) 5-325 MG tablet Take 1-2 tablets by mouth every 4 (four) hours as needed for severe pain. (Patient not taking: Reported on 04/25/2021) 30 tablet 0   polyethylene glycol (MIRALAX) 17 g packet Take 17 g by mouth daily. (Patient not taking: Reported on 04/25/2021) 14 each 0     Discharge Medications: Please see discharge summary for a list of discharge medications.  Relevant Imaging Results:  Relevant Lab Results:   Additional Information SSN #:  [id number]  Ht:  5\' 7"   Wt:  130lbs  BMI:  20.36 kg/m2  Raequan Vanschaick C Tarpley-Hebel, LCSWA

## 2021-04-25 NOTE — Progress Notes (Addendum)
TOC CSW discovered pt has been COVID vaccinated on 04/10/2020 and 05/01/2020 with [employer] vaccine.  CSW with the assistance of [place], RN inquired to pt if he would like to be boostered.  Pt will consider it, but not at this time.  RN stated she would continue to check with pt.  Kitty Cadavid Tarpley-Barrows, MSW, LCSW-A Pronouns:  She/Her/Hers [facility] HealthTransitions of Care Clinical Social Worker Direct Number:  [phone] [email or web address]

## 2021-04-26 NOTE — Progress Notes (Signed)
CSW submitted insurance authorization -pending reference [id number]. CSW spoke with [facility] , she stated pt will be accepted when insurance Berkley Harvey is approved.   Valentina Shaggy.Mihika Surrette, MSW, LCSWA [facility]  Transitions of Care Clinical Social Worker I Direct Dial: [phone]  Fax: [phone] [email or web address]

## 2021-04-26 NOTE — ED Notes (Signed)
Pt asking to speak to [facility]

## 2021-04-26 NOTE — ED Notes (Signed)
Pt granddaughter called and states pt lives with her. Left message with SW letting them know to call her. Talbert Forest [phone]

## 2021-04-26 NOTE — Progress Notes (Addendum)
CSW spoke with Kenney Houseman at [facility] who states the facility can accept the patient once insurance authorization is obtained.   CSW requested MD order COVID booster shot for patient so that he does not have to go into isolation once he arrives at the facility.  Edwin Dada, MSW, LCSW Transitions of Care  Clinical Social Worker II [phone]

## 2021-04-26 NOTE — ED Notes (Signed)
Pt is becoming very agitated. Will not take meds po, IM meds given per mar
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
## 2021-04-27 NOTE — ED Notes (Signed)
Pt alert, no s/s of distress.  Pt uncooperative with VS> pt refused retake of VS.

## 2021-04-27 NOTE — Progress Notes (Signed)
CSW  called [employer] to get update insurance Auth is still pending.    Valentina Shaggy.Beldon Nowling, MSW, LCSWA [facility]  Transitions of Care Clinical Social Worker I Direct Dial: [phone]  Fax: [phone] [email or web address]

## 2021-04-28 NOTE — ED Notes (Signed)
Patient has been verbally aggressive including cursing at staff this shift.  Patient refuses to allow staff to take AM vitals.

## 2021-04-28 NOTE — Progress Notes (Signed)
CSW called [employer] to inquired about auth status, Berkley Harvey is still pending clinical review as of 12:20 pm 04/28/21.   Valentina Shaggy.Delani Kohli, MSW, LCSWA [facility]  Transitions of Care Clinical Social Worker I Direct Dial: [phone]  Fax: [phone] [email or web address]

## 2021-04-28 NOTE — Progress Notes (Signed)
Physical Therapy Treatment Patient Details Name: Charles Morgan MRN: [id number] DOB: 12/12/1929 Today's Date: 04/28/2021    History of Present Illness 87 yo male admitted after falling at home. Family is unable to continue to care for him. Hx of vertebrobasilar insufficiency, syncope, CVA, autonomic dysfunction, DVT, legally blind, MI, polysubstance abuse    PT Comments    The patient was able to participate in mobility today with encouragement. Patient did not want any one touching him. Patient pulled up onto RW, gradual increase in body tremors to point patient sits down. Patient stood x 4 ,never able to take any steps, significant festination and leans against bed. Significant tremors shaking  at times. Continue PT for mobility.   Follow Up Recommendations  SNF     Equipment Recommendations  None recommended by PT    Recommendations for Other Services       Precautions / Restrictions Precautions Precautions: Fall Precaution Comments: can be agitated, was not so much this visit    Mobility  Bed Mobility   Bed Mobility: Supine to Sit;Sit to Supine     Supine to sit: Max assist;+2 for physical assistance;+2 for safety/equipment Sit to supine: Max assist;+2 for physical assistance;+2 for safety/equipment   General bed mobility comments: with much encoragemnt, patient did move legs to bed edge, max assist to sit upright, increased  body shaking, gradually calms down. Max of 2 to return to  supine.    Transfers Overall transfer level: Needs assistance Equipment used: Rolling walker (2 wheeled) Transfers: Sit to/from Stand Sit to Stand: Mod assist         General transfer comment: PT held RW daown. Patient pulled up on RW, legs pushed back against bed. Patient stood for several seconds then had increas in body tremors and sits down. Patient stood and sat x 4 . Never able to move feet forward to take a step. Strong posterior lean.  Ambulation/Gait              General Gait Details: unable to advance the feet.   Stairs             Wheelchair Mobility    Modified Rankin (Stroke Patients Only)       Balance Overall balance assessment: Needs assistance Sitting-balance support: No upper extremity supported;Feet supported Sitting balance-Leahy Scale: Fair Sitting balance - Comments: initially posterior lean, improves when reaching for RW. Postural control: Posterior lean Standing balance support: Bilateral upper extremity supported;During functional activity Standing balance-Leahy Scale: Poor Standing balance comment: supports on Rw, leans against bed                            Cognition   Behavior During Therapy: Restless Overall Cognitive Status: No family/caregiver present to determine baseline cognitive functioning                                 General Comments: patient initially no cooperative, with encouragement, consented to placing scrubs on. Patient did not want any extra support to sit up stating" Don't touch Me.". Patient did require max assist to sit up. also did not want therapist to support when standing.      Exercises      General Comments        Pertinent Vitals/Pain Pain Assessment: No/denies pain    Home Living  Prior Function            PT Goals (current goals can now be found in the care plan section) Progress towards PT goals: Progressing toward goals    Frequency    Min 2X/week      PT Plan Current plan remains appropriate    Co-evaluation              AM-PAC PT "6 Clicks" Mobility   Outcome Measure  Help needed turning from your back to your side while in a flat bed without using bedrails?: A Lot Help needed moving from lying on your back to sitting on the side of a flat bed without using bedrails?: Total Help needed moving to and from a bed to a chair (including a wheelchair)?: Total Help needed standing up from a  chair using your arms (e.g., wheelchair or bedside chair)?: A Lot Help needed to walk in hospital room?: Total Help needed climbing 3-5 steps with a railing? : Total 6 Click Score: 8    End of Session Equipment Utilized During Treatment: Gait belt Activity Tolerance: Patient tolerated treatment well Patient left: in bed;with call bell/phone within reach;with nursing/sitter in room Nurse Communication: Mobility status PT Visit Diagnosis: Muscle weakness (generalized) (M62.81);History of falling (Z91.81);Other abnormalities of gait and mobility (R26.89)     Time: 2703-5009 PT Time Calculation (min) (ACUTE ONLY): 20 min  Charges:  $Therapeutic Activity: 8-22 mins                     Blanchard Kelch PT Acute Rehabilitation Services Pager [phone] Office [phone]    Rada Hay 04/28/2021, 10:47 AM

## 2021-04-29 DIAGNOSIS — M542 Cervicalgia: Secondary | ICD-10-CM | POA: Diagnosis not present

## 2021-04-29 DIAGNOSIS — R519 Headache, unspecified: Secondary | ICD-10-CM | POA: Diagnosis not present

## 2021-04-29 DIAGNOSIS — M25552 Pain in left hip: Secondary | ICD-10-CM | POA: Diagnosis not present

## 2021-04-29 DIAGNOSIS — M25551 Pain in right hip: Secondary | ICD-10-CM | POA: Diagnosis not present

## 2021-04-29 MED ORDER — COVID-19 MRNA VAC-TRIS(PFIZER) 30 MCG/0.3ML IM SUSP: 0.3000 mL | Freq: Once | INTRAMUSCULAR | Status: DC

## 2021-04-29 MED ORDER — LORAZEPAM 2 MG/ML IJ SOLN
1.0000 mg | Freq: Once | INTRAMUSCULAR | Status: AC
  Administered 2021-04-29: 1 mg via INTRAMUSCULAR
  Filled 2021-04-29: qty 1

## 2021-04-29 MED ORDER — HALOPERIDOL LACTATE 5 MG/ML IJ SOLN
5.0000 mg | Freq: Once | INTRAMUSCULAR | Status: DC
  Filled 2021-04-29: qty 1

## 2021-04-29 NOTE — BH Assessment (Signed)
TTS contacted [facility] for telepsych. Per Alvino Chapel, RN patient is currently asleep and unable to participate. TTS will call back at a later time.

## 2021-04-29 NOTE — Progress Notes (Addendum)
CSW called insurance to receive an update on insurance auth. CSW faxed over recent therapy notes as requested by for SNF Auth.     Adden 12:30pm Authorization was approved  Berkley Harvey #[id number]   Valentina Shaggy.Justyn Langham, MSW, LCSWA [facility]  Transitions of Care Clinical Social Worker I Direct Dial: [phone]  Fax: [phone] [email or web address]

## 2021-04-29 NOTE — ED Notes (Signed)
PT URINATED ALL OVER BED AND CLOTHES. INCONTINENCE CARE GIVEN AND BED CHANGED.

## 2021-04-29 NOTE — Progress Notes (Signed)
CSW spoke with pt at bedside, CSW informed pt about approval of insurance auth and availability of bed at [facility]. CSW informed pt, he will need to take the COVID booster. Pt is not willing to take COVID booster. CSW informed pt that he will have to go home with his granddaughter if he is not willing to go to [facility]. Pt seemed confused and agitated during conversation. Pt was verbally aggressive and throw items from bedside table to the floor. TTS consult was order for this pt . TOC will continue to follow.     Valentina Shaggy.Dasie Chancellor, MSW, LCSWA [facility]  Transitions of Care Clinical Social Worker I Direct Dial: [phone]  Fax: [phone] [email or web address]

## 2021-04-29 NOTE — ED Provider Notes (Signed)
Emergency Medicine Observation Re-evaluation Note  Charles Morgan is a 87 y.o. male, seen on rounds today.  Pt initially presented to the ED for complaints of Fall Currently, the patient is sleeping.  Physical Exam  BP 106/74 (BP Location: Right Arm)   Pulse 72   Temp 98 F (36.7 C) (Axillary)   Resp 18   Ht 5\' 7"  (1.702 m)   Wt 59 kg   SpO2 100%   BMI 20.36 kg/m  Physical Exam General: sleeping Cardiac: normal rate Lungs: no increased WOB Psych: sleeping, calm  ED Course / MDM  EKG:   I have reviewed the labs performed to date as well as medications administered while in observation.  Recent changes in the last 24 hours include none.  Plan  Current plan is for awaiting SNF placement.  Charles Morgan is not under involuntary commitment.     [name], MD 04/29/21 [phone]

## 2021-04-29 NOTE — ED Notes (Signed)
Pt became very agitated when asked about getting booster and started throwing his stuff on the floor and attempted to get out of bed. Prn meds given per mar

## 2021-04-29 NOTE — ED Notes (Signed)
Writer asked pt about getting the Covid booster shot so he could be placed and he stated "hell no". Md and SW aware

## 2021-04-29 NOTE — ED Notes (Signed)
Patient slept entire shift.  No distress noted.

## 2021-04-30 DIAGNOSIS — R456 Violent behavior: Secondary | ICD-10-CM | POA: Diagnosis not present

## 2021-04-30 DIAGNOSIS — M25551 Pain in right hip: Secondary | ICD-10-CM | POA: Diagnosis not present

## 2021-04-30 DIAGNOSIS — R519 Headache, unspecified: Secondary | ICD-10-CM | POA: Diagnosis not present

## 2021-04-30 DIAGNOSIS — Z7401 Bed confinement status: Secondary | ICD-10-CM | POA: Diagnosis not present

## 2021-04-30 DIAGNOSIS — M25552 Pain in left hip: Secondary | ICD-10-CM | POA: Diagnosis not present

## 2021-04-30 DIAGNOSIS — M542 Cervicalgia: Secondary | ICD-10-CM | POA: Diagnosis not present

## 2021-04-30 MED ORDER — OLANZAPINE 5 MG PO TBDP: 2.5000 mg | ORAL_TABLET | Freq: Every day | ORAL | Status: DC

## 2021-04-30 NOTE — Progress Notes (Signed)
[facility] has been confirmed for pick up.  Valentina Shaggy.Marialy Urbanczyk, MSW, LCSWA [facility]  Transitions of Care Clinical Social Worker I Direct Dial: [phone]  Fax: [phone] [email or web address]

## 2021-04-30 NOTE — ED Notes (Signed)
Pt woke up and was being loud due to wanting the bedside table over his bed. Pt scooted to the side of the bed trying to grab the bedside table. Bedside table taken out of the room and pt repositioned in bed.

## 2021-04-30 NOTE — BH Assessment (Signed)
Comprehensive Clinical Assessment (CCA) Note  04/30/2021 Charles Morgan [id number] DISPOSITION: [place] FNP recommends patient be cleared by psychiatry. CSW to assist with ongoing needs.   Patient is a 87 year old male that initially presented after a fall at his residence and was seen by TTS for AMS. Patient denies any S/I, H/I or AVH this date. Patient seems to process the content of this writer's questions and is oriented x 4. Patient is somewhat disorganized although is redirectable with prompting. Patient's mental health history is limited per chart review. Patient denies any current mental health symptoms and is stating that he would like to return home. Per note review (note by CSW on 8/1) that states patient's daughter will assist with ongoing care after discharge.     Note from 04/26/21: CSW spoke with Charles Morgan APS [phone] ex [phone]) , she reported APS involvement in December of 2021, at this time pt was placed in [facility] from living with his daughter in [place]. APS stated getting a new report on Friday about same concern, but have not started the investigation yet. APS SW stated she will contact CSW after going to the daughter home.   CSW spoke with [name]([phone]), she reported moving from [place] to [place] due to pt not waning to be in Nursing Home any more. She reported pt started living with her in June of 2022. Pt's granddaughter stated pt's ambulates with a walker, pt live in a trailer with four steps to get in and out of the trailer, pt uses a urinal and will use his walker to go to the bathroom next door to his room for BM. She reported pt has someone in the home that assist with his 24/7 care. Pt granddaughter reported at last doctor visit, pt;s doctor suggested memory care due to his dementia. Pt daughter stated pt is allowed to come back to the home when he is d/c.  Patient is oriented x 4. Patient speaks in a low  voice that is difficult to understand at times. Patient's memory appears to be intact although thoughts somewhat disorganized. Patient seems to process the content of this writer's questions and denies any self harm. Patient is redirectable with prompting. Patient does not appear to be responding to internal stimuli.         Chief Complaint:  Chief Complaint  Patient presents with   Fall   Visit Diagnosis: Altered mental state     CCA Screening, Triage and Referral (STR)  Patient Reported Information How did you hear about [place]? Self  What Is the Reason for Your Visit/Call Today? Pt initially presented medically for a fall and due to AMS was seen by psych.  How Long Has This Been Causing You Problems? <Week  What Do You Feel Would Help You the Most Today? -- (NA)   Have You Recently Had Any Thoughts About Hurting Yourself? No  Are You Planning to Commit Suicide/Harm Yourself At This time? No   Have you Recently Had Thoughts About Hurting Someone Charles Morgan? No  Are You Planning to Harm Someone at This Time? No  Explanation: No data recorded  Have You Used Any Alcohol or Drugs in the Past 24 Hours? No  How Long Ago Did You Use Drugs or Alcohol? No data recorded What Did You Use and How Much? No data recorded  Do You Currently Have a Therapist/Psychiatrist? No  Name of Therapist/Psychiatrist: No data recorded  Have You Been Recently Discharged From Any  Public relations account executiveffice Practice or Programs? No  Explanation of Discharge From Practice/Program: No data recorded    CCA Screening Triage Referral Assessment Type of Contact: Face-to-Face  Telemedicine Service Delivery:   Is this Initial or Reassessment? No data recorded Date Telepsych consult ordered in [facility]:  No data recorded Time Telepsych consult ordered in [facility]:  No data recorded Location of Assessment: [facility] ED  Provider Location: Other (comment) ([facility])   Collateral Involvement: None at this time   Does Patient Have a Court  Appointed Legal Guardian? No data recorded Name and Contact of Legal Guardian: No data recorded If Minor and Not Living with Parent(s), Who has Custody? NA  Is CPS involved or ever been involved? Never  Is APS involved or ever been involved? Never   Patient Determined To Be At Risk for Harm To Self or Others Based on Review of Patient Reported Information or Presenting Complaint? No  Method: No data recorded Availability of Means: No data recorded Intent: No data recorded Notification Required: No data recorded Additional Information for Danger to Others Potential: No data recorded Additional Comments for Danger to Others Potential: No data recorded Are There Guns or Other Weapons in Your Home? No data recorded Types of Guns/Weapons: No data recorded Are These Weapons Safely Secured?                            No data recorded Who Could Verify You Are Able To Have These Secured: No data recorded Do You Have any Outstanding Charges, Pending Court Dates, Parole/Probation? No data recorded Contacted To Inform of Risk of Harm To Self or Others: Other: Comment (NA)    Does Patient Present under Involuntary Commitment? No  IVC Papers Initial File Date: No data recorded  [place]   Patient Currently Receiving the Following Services: Not Receiving Services   Determination of Need: Routine (7 days)   Options For Referral: Outpatient Therapy     CCA Biopsychosocial Patient Reported Schizophrenia/Schizoaffective Diagnosis in Past: No data recorded  Strengths: No data recorded  Mental Health Symptoms Depression:  No data recorded  Duration of Depressive symptoms:    Mania:  No data recorded  Anxiety:   No data recorded  Psychosis:  No data recorded  Duration of Psychotic symptoms:    Trauma:  No data recorded  Obsessions:  No data recorded  Compulsions:  No data recorded  Inattention:  No data recorded  Hyperactivity/Impulsivity:  No data recorded   Oppositional/Defiant Behaviors:  No data recorded  Emotional Irregularity:  No data recorded  Other Mood/Personality Symptoms:  No data recorded   Mental Status Exam Appearance and self-care  Stature:  No data recorded  Weight:  No data recorded  Clothing:  No data recorded  Grooming:  No data recorded  Cosmetic use:  No data recorded  Posture/gait:  No data recorded  Motor activity:  No data recorded  Sensorium  Attention:  No data recorded  Concentration:  No data recorded  Orientation:  No data recorded  Recall/memory:  No data recorded  Affect and Mood  Affect:  No data recorded  Mood:  No data recorded  Relating  Eye contact:  No data recorded  Facial expression:  No data recorded  Attitude toward examiner:  No data recorded  Thought and Language  Speech flow: No data recorded  Thought content:  No data recorded  Preoccupation:  No data recorded  Hallucinations:  No data  recorded  Organization:  No data recorded  [employer] Knowledge:  No data recorded  Intelligence:  No data recorded  Abstraction:  No data recorded  Judgement:  No data recorded  Reality Testing:  No data recorded  Insight:  No data recorded  Decision Making:  No data recorded  Social Functioning  Social Maturity:  No data recorded  Social Judgement:  No data recorded  Stress  Stressors:  No data recorded  Coping Ability:  No data recorded  Skill Deficits:  No data recorded  Supports:  No data recorded    Religion:    Leisure/Recreation:    Exercise/Diet:     CCA Employment/Education Employment/Work Situation:    Education:     CCA Family/Childhood History Family and Relationship History:    Childhood History:     Child/Adolescent Assessment:     CCA Substance Use Alcohol/Drug Use:                           ASAM's:  Six Dimensions of Multidimensional Assessment  Dimension 1:  Acute Intoxication and/or Withdrawal Potential:       Dimension 2:  Biomedical Conditions and Complications:      Dimension 3:  Emotional, Behavioral, or Cognitive Conditions and Complications:     Dimension 4:  Readiness to Change:     Dimension 5:  Relapse, Continued use, or Continued Problem Potential:     Dimension 6:  Recovery/Living Environment:     ASAM Severity Score:    ASAM Recommended Level of Treatment:     Substance use Disorder (SUD)    Recommendations for Services/Supports/Treatments:    Discharge Disposition:    DSM5 Diagnoses: Patient Active Problem List   Diagnosis Date Noted   Corns and callus 11/27/2020   Primary open angle glaucoma 11/27/2020   Pain in wrist 11/27/2020   Onychomycosis 11/27/2020   Old myocardial infarction 11/27/2020   Occupational maladjustment 11/27/2020   Nonspecific reaction to tuberculin skin test 11/27/2020   Neuralgia 11/27/2020   Microscopic hematuria 11/27/2020   Metabolic encephalopathy 11/27/2020   Lung field abnormal 11/27/2020   Localized, primary osteoarthritis of hand 11/27/2020   Intracranial hemorrhage (HCC) 11/27/2020   Inguinal hernia 11/27/2020   Impacted cerumen 11/27/2020   Housing lack 11/27/2020   History of thrombocytopenia 11/27/2020   History of fall 11/27/2020   Groin mass 11/27/2020   Esophageal reflux 11/27/2020   Edema 11/27/2020   Dysphagia 11/27/2020   Cocaine abuse, episodic use (HCC) 11/27/2020   Constipation 11/27/2020   Disorder of oral mucous membrane 11/27/2020   Duodenal ulcer without hemorrhage, perforation, or obstruction 11/27/2020   Benign prostatic hyperplasia 11/27/2020   Atopic dermatitis 11/27/2020   Allergic rhinitis 11/27/2020   Alcohol abuse 11/27/2020   Acute pharyngitis 11/27/2020   Bilateral hand pain 11/27/2020   Mild cognitive impairment 11/27/2020   Neuropraxia of right upper extremity, initial encounter 09/04/2020   Lactic acidosis 09/01/2020   Hypoalbuminemia 09/01/2020   Hyperammonemia (HCC) 09/01/2020   Altered  mental status 08/31/2020   CKD (chronic kidney disease) stage 3, GFR 30-59 ml/min (HCC) 06/05/2020   Basilar artery stenosis 06/05/2020   Transaminasemia 06/05/2020   Thrombocytopenia (HCC) 06/05/2020   Syncope 06/04/2020   Dehydration 06/04/2020   AKI (acute kidney injury) (HCC) 06/04/2020   HLD (hyperlipidemia) 06/04/2020   Calcification of aortic valve 06/04/2020   Syncope and collapse 06/04/2020   Ventricular tachycardia (HCC) 02/18/2020   Adrenal insufficiency (  Addison's disease) (HCC) 01/26/2020   Ambulatory dysfunction 01/26/2020   Severe muscle deconditioning 01/12/2020   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

## 2021-04-30 NOTE — ED Provider Notes (Signed)
Emergency Medicine Observation Re-evaluation Note  Charles Morgan is a 87 y.o. male, seen on rounds today.  Pt initially presented to the ED for complaints of Fall Patient had multiple recent falls.  Intermittent aggression in the ED.  Currently, the patient is awaiting SNF placement.  Physical Exam  BP 106/74 (BP Location: Right Arm)   Pulse 72   Temp 98 F (36.7 C) (Axillary)   Resp 18   Ht 5\' 7"  (1.702 m)   Wt 59 kg   SpO2 100%   BMI 20.36 kg/m  Physical Exam General: Sleeping Cardiac: Normal heart rate Lungs: Breathing even unlabored Psych: Deferred  ED Course / MDM  EKG:   I have reviewed the labs performed to date as well as medications administered while in observation.  Recent changes in the last 24 hours include awaiting social work disposition.  TTS signed off.  Social work engaged the patient, who refused to go to a skilled nursing facility.  Social work spoke with the patient's granddaughter who states that she is working on getting the patient into a long-term memory care unit.  Patient to be transported home, where his daughter will be to receive him.  On repeat assessment, patient was awake, sitting up in bed, eating breakfast.  Due to his dementia, he is not able to provide any history.  Patient denies any current complaints.  Patient was discharged for PTAR transport home  Plan    Charles Morgan is not under involuntary commitment.     [name], MD 04/30/21 [phone]

## 2021-04-30 NOTE — Progress Notes (Signed)
Physical Therapy Treatment Patient Details Name: Charles Morgan MRN: [id number] DOB: Jan 14, 1930 Today's Date: 04/30/2021    History of Present Illness 87 yo male admitted after falling at home. Family is unable to continue to care for him. Hx of vertebrobasilar insufficiency, syncope, CVA, autonomic dysfunction, DVT, legally blind, MI, polysubstance abuse    PT Comments    Pt seen in ED.  Eating lunch.  Required MAX encouragement to participate.  Bed was wet as pt is incont so told him we needed to change to bed and to sit in chair for a minute.  General bed mobility comments: with much encoragemnt, patient did move legs to bed edge, max assist to sit upright, increased  body shaking, gradually calms down. Max of 2 to return to  supine. General transfer comment: placed chair close to bed and had pt use B UE's to transfer 1/4 pivot.  Increased tremors upond standing. Festination.  Frozen stance.  Poor flex posture.  Narrow BOS.  Great difficulty completing pivot turn safely.  Therapist assisted despite pt's request of "don't touch me". Assisted OOB to regular straight back chair and again back to bed.   Follow Up Recommendations  SNF     Equipment Recommendations  None recommended by PT    Recommendations for Other Services       Precautions / Restrictions Precautions Precautions: Fall Precaution Comments: can be agitated "don't touch me", low vision    Mobility  Bed Mobility Overal bed mobility: Needs Assistance Bed Mobility: Supine to Sit;Sit to Supine     Supine to sit: Max assist Sit to supine: Max assist   General bed mobility comments: with much encoragemnt, patient did move legs to bed edge, max assist to sit upright, increased  body shaking, gradually calms down. Max of 2 to return to  supine.    Transfers Overall transfer level: Needs assistance Equipment used: None Transfers: Stand Pivot Transfers   Stand pivot transfers: Mod assist;Max assist       General  transfer comment: placed chair close to bed and had pt use B UE's to transfer 1/4 pivot.  Increased tremors upond standing. Festination.  Frozen stance.  Poor flex posture.  Narrow BOS.  Great difficulty completing pivot turn safely.  Therapist assisted despite pt's request of "don't touch me". Assisted OOB to regular straight back chair and again back to bed.  Bed changed as pt is incont urine.  Ambulation/Gait             General Gait Details: transfers only with difficulty   Stairs             Wheelchair Mobility    Modified Rankin (Stroke Patients Only)       Balance                                            Cognition Arousal/Alertness: Awake/alert Behavior During Therapy: Restless Overall Cognitive Status: No family/caregiver present to determine baseline cognitive functioning                                 General Comments: AxO x 1 following repeat simple commands but inconsistant to follow through.  Easily distracted.  Easily agittated. "Don't touch me" and "read the back of this what does it say"      Exercises  General Comments        Pertinent Vitals/Pain Pain Assessment: No/denies pain    Home Living                      Prior Function            PT Goals (current goals can now be found in the care plan section) Progress towards PT goals: Progressing toward goals    Frequency    Min 2X/week      PT Plan Current plan remains appropriate    Co-evaluation              AM-PAC PT "6 Clicks" Mobility   Outcome Measure  Help needed turning from your back to your side while in a flat bed without using bedrails?: A Lot Help needed moving from lying on your back to sitting on the side of a flat bed without using bedrails?: Total Help needed moving to and from a bed to a chair (including a wheelchair)?: Total Help needed standing up from a chair using your arms (e.g., wheelchair or  bedside chair)?: Total Help needed to walk in hospital room?: Total Help needed climbing 3-5 steps with a railing? : Total 6 Click Score: 7    End of Session Equipment Utilized During Treatment: Gait belt Activity Tolerance: Patient tolerated treatment well Patient left: in bed;with call bell/phone within reach;with nursing/sitter in room Nurse Communication: Mobility status PT Visit Diagnosis: Muscle weakness (generalized) (M62.81);History of falling (Z91.81);Other abnormalities of gait and mobility (R26.89)     Time: 5885-0277 PT Time Calculation (min) (ACUTE ONLY): 24 min  Charges:  $Therapeutic Activity: 23-37 mins                     {Alliana Mcauliff  PTA Acute  Rehabilitation Services Pager      [phone] Office      [phone]

## 2021-04-30 NOTE — ED Notes (Signed)
Pt DC d off unit to home per provider. Pt alert,  no s/s of distress. Pt refused VS. DC information and belongings given to PTAR for transportation. Pt off unit on stretcher, transported by PTAR.

## 2021-04-30 NOTE — BH Assessment (Signed)
Clinician spoke to Zettie Pho, RN to see if the pt is able to engage in TTS assessment. Per RN the pt is sleeping (had hard time going to sleep). Clinician asked RN to call once the pt is able to engage, contact information provided.    Redmond Pulling, MS, [facility], [facility] Triage Specialist [phone].

## 2021-04-30 NOTE — Progress Notes (Signed)
CSW spoke with pt, pt refuses to get COVID booster and refuses to go to SNF. CSW spoke with pt's granddaughter who stated she is working with pt's PCP to get pt in to Long term Memory Care. CSW sent referral to [facility] to assist in the processes. CSW will contact PTAR to transport pt home upon d/c. Marland Kitchen   Valentina Shaggy.Jacayla Nordell, MSW, LCSWA [facility]  Transitions of Care Clinical Social Worker I Direct Dial: [phone]  Fax: [phone] [email or web address]

## 2021-04-30 NOTE — BH Assessment (Signed)
[facility] Assessment Progress Note   Per Caryn Bee, NP, this voluntary pt does not require psychiatric hospitalization at this time.  Pt is psychiatrically cleared.  Pt is to be returned to [facility] to completed disposition.  EDP Gloris Manchester, MD, pt's nurse, Beth, and Christina Christovale, LCSW have been notified.  Doylene Canning, MA Triage Specialist [phone]

## 2021-06-28 ENCOUNTER — Other Ambulatory Visit: Payer: Self-pay

## 2021-06-28 ENCOUNTER — Emergency Department (HOSPITAL_COMMUNITY): Payer: Medicare HMO

## 2021-06-28 ENCOUNTER — Encounter (HOSPITAL_COMMUNITY): Payer: Self-pay | Admitting: *Deleted

## 2021-06-28 ENCOUNTER — Inpatient Hospital Stay (HOSPITAL_COMMUNITY)
Admission: EM | Admit: 2021-06-28 | Discharge: 2021-07-01 | DRG: 689 | Disposition: A | Discharge status: Hospice | Payer: Medicare HMO | Source: Ambulatory Visit | Attending: Internal Medicine | Admitting: Internal Medicine

## 2021-06-28 DIAGNOSIS — Z888 Allergy status to other drugs, medicaments and biological substances status: Secondary | ICD-10-CM

## 2021-06-28 DIAGNOSIS — R7401 Elevation of levels of liver transaminase levels: Secondary | ICD-10-CM | POA: Diagnosis present

## 2021-06-28 DIAGNOSIS — N390 Urinary tract infection, site not specified: Secondary | ICD-10-CM | POA: Diagnosis not present

## 2021-06-28 DIAGNOSIS — Z66 Do not resuscitate: Secondary | ICD-10-CM | POA: Diagnosis present

## 2021-06-28 DIAGNOSIS — Z638 Other specified problems related to primary support group: Secondary | ICD-10-CM

## 2021-06-28 DIAGNOSIS — Z515 Encounter for palliative care: Secondary | ICD-10-CM

## 2021-06-28 DIAGNOSIS — E785 Hyperlipidemia, unspecified: Secondary | ICD-10-CM | POA: Diagnosis present

## 2021-06-28 DIAGNOSIS — B9561 Methicillin susceptible Staphylococcus aureus infection as the cause of diseases classified elsewhere: Secondary | ICD-10-CM | POA: Diagnosis present

## 2021-06-28 DIAGNOSIS — R64 Cachexia: Secondary | ICD-10-CM | POA: Diagnosis present

## 2021-06-28 DIAGNOSIS — R531 Weakness: Secondary | ICD-10-CM

## 2021-06-28 DIAGNOSIS — Z79899 Other long term (current) drug therapy: Secondary | ICD-10-CM

## 2021-06-28 DIAGNOSIS — E860 Dehydration: Secondary | ICD-10-CM | POA: Diagnosis present

## 2021-06-28 DIAGNOSIS — Z86718 Personal history of other venous thrombosis and embolism: Secondary | ICD-10-CM

## 2021-06-28 DIAGNOSIS — F03C0 Unspecified dementia, severe, without behavioral disturbance, psychotic disturbance, mood disturbance, and anxiety: Secondary | ICD-10-CM | POA: Diagnosis present

## 2021-06-28 DIAGNOSIS — N183 Chronic kidney disease, stage 3 unspecified: Secondary | ICD-10-CM | POA: Diagnosis present

## 2021-06-28 DIAGNOSIS — N3001 Acute cystitis with hematuria: Secondary | ICD-10-CM

## 2021-06-28 DIAGNOSIS — G8929 Other chronic pain: Secondary | ICD-10-CM | POA: Diagnosis present

## 2021-06-28 DIAGNOSIS — R627 Adult failure to thrive: Secondary | ICD-10-CM | POA: Diagnosis present

## 2021-06-28 DIAGNOSIS — I350 Nonrheumatic aortic (valve) stenosis: Secondary | ICD-10-CM | POA: Diagnosis present

## 2021-06-28 DIAGNOSIS — L97529 Non-pressure chronic ulcer of other part of left foot with unspecified severity: Secondary | ICD-10-CM

## 2021-06-28 DIAGNOSIS — D696 Thrombocytopenia, unspecified: Secondary | ICD-10-CM | POA: Diagnosis present

## 2021-06-28 DIAGNOSIS — L89899 Pressure ulcer of other site, unspecified stage: Secondary | ICD-10-CM | POA: Diagnosis present

## 2021-06-28 DIAGNOSIS — M1990 Unspecified osteoarthritis, unspecified site: Secondary | ICD-10-CM | POA: Diagnosis present

## 2021-06-28 DIAGNOSIS — Z20822 Contact with and (suspected) exposure to covid-19: Secondary | ICD-10-CM | POA: Diagnosis present

## 2021-06-28 DIAGNOSIS — E43 Unspecified severe protein-calorie malnutrition: Secondary | ICD-10-CM | POA: Diagnosis present

## 2021-06-28 DIAGNOSIS — Z6820 Body mass index (BMI) 20.0-20.9, adult: Secondary | ICD-10-CM

## 2021-06-28 DIAGNOSIS — D72825 Bandemia: Secondary | ICD-10-CM | POA: Diagnosis present

## 2021-06-28 DIAGNOSIS — D649 Anemia, unspecified: Secondary | ICD-10-CM | POA: Diagnosis present

## 2021-06-28 DIAGNOSIS — Z8673 Personal history of transient ischemic attack (TIA), and cerebral infarction without residual deficits: Secondary | ICD-10-CM

## 2021-06-28 DIAGNOSIS — Z7401 Bed confinement status: Secondary | ICD-10-CM

## 2021-06-28 DIAGNOSIS — R1310 Dysphagia, unspecified: Secondary | ICD-10-CM | POA: Diagnosis present

## 2021-06-28 NOTE — ED Notes (Signed)
X1 for vitals recheck with no response 

## 2021-06-28 NOTE — ED Triage Notes (Signed)
Pt arrived by [facility] from dr office. Family took patent to md office due to not eating or drinking x 1 week and now has weakness and dark urine. Per ems, patient is refusing care. Similar occurred in July, patient refused admission or being admitted to [facility].

## 2021-06-28 NOTE — ED Provider Notes (Signed)
Emergency Medicine Provider Triage Evaluation Note  Charles Morgan , a 87 y.o. male  was evaluated in triage.  Pt complains of failure to thrive.  Patient has a history of dementia.  Reportedly sent from MD office due to not eating or drinking.  He has more weak than baseline and has dark urine.  Does not have complaints when I ask.  Review of Systems  Positive: Weakness Negative: Fever  Physical Exam  SpO2 99%  Gen:   Awake, no distress, confused. Resp:  Normal effort  MSK:   Moves extremities without difficulty  Other:  Oral mucosa appears very dry.  Normal cardiac rate.  Abdomen with midline surgical scar, healed.  No apparent tenderness.  Medical Decision Making  Medically screening exam initiated at 2:57 PM.  Appropriate orders placed.  Charles Morgan was informed that the remainder of the evaluation will be completed by another provider, this initial triage assessment does not replace that evaluation, and the importance of remaining in the ED until their evaluation is complete.     Renne Crigler, PA-C 06/28/21 1458    Bethann Berkshire, MD 07/03/21 1041

## 2021-06-29 ENCOUNTER — Observation Stay (HOSPITAL_COMMUNITY): Payer: Medicare HMO

## 2021-06-29 DIAGNOSIS — R638 Other symptoms and signs concerning food and fluid intake: Secondary | ICD-10-CM

## 2021-06-29 DIAGNOSIS — R7401 Elevation of levels of liver transaminase levels: Secondary | ICD-10-CM

## 2021-06-29 DIAGNOSIS — N390 Urinary tract infection, site not specified: Principal | ICD-10-CM | POA: Diagnosis present

## 2021-06-29 DIAGNOSIS — Z7189 Other specified counseling: Secondary | ICD-10-CM

## 2021-06-29 DIAGNOSIS — R531 Weakness: Secondary | ICD-10-CM

## 2021-06-29 DIAGNOSIS — F03C0 Unspecified dementia, severe, without behavioral disturbance, psychotic disturbance, mood disturbance, and anxiety: Secondary | ICD-10-CM

## 2021-06-29 DIAGNOSIS — R627 Adult failure to thrive: Secondary | ICD-10-CM

## 2021-06-29 DIAGNOSIS — Z66 Do not resuscitate: Secondary | ICD-10-CM

## 2021-06-29 DIAGNOSIS — R262 Difficulty in walking, not elsewhere classified: Secondary | ICD-10-CM

## 2021-06-29 LAB — URINALYSIS, ROUTINE W REFLEX MICROSCOPIC
Bilirubin Urine: NEGATIVE
Glucose, UA: NEGATIVE mg/dL
Ketones, ur: NEGATIVE mg/dL
Nitrite: POSITIVE — AB
Protein, ur: 30 mg/dL — AB
Specific Gravity, Urine: 1.019 (ref 1.005–1.030)
WBC, UA: >50 WBC/hpf — ABNORMAL HIGH (ref 0–5)
pH: 5 (ref 5.0–8.0)

## 2021-06-29 LAB — COMPREHENSIVE METABOLIC PANEL
ALT: 35 U/L (ref 0–44)
AST: 70 U/L — ABNORMAL HIGH (ref 15–41)
Albumin: 2.1 g/dL — ABNORMAL LOW (ref 3.5–5.0)
Alkaline Phosphatase: 69 U/L (ref 38–126)
Anion gap: 8 (ref 5–15)
BUN: 35 mg/dL — ABNORMAL HIGH (ref 8–23)
CO2: 25 mmol/L (ref 22–32)
Calcium: 9.4 mg/dL (ref 8.9–10.3)
Chloride: 110 mmol/L (ref 98–111)
Creatinine, Ser: 0.98 mg/dL (ref 0.61–1.24)
GFR, Estimated: >60 mL/min (ref >60)
Glucose, Bld: 130 mg/dL — ABNORMAL HIGH (ref 70–99)
Potassium: 3.9 mmol/L (ref 3.5–5.1)
Sodium: 143 mmol/L (ref 135–145)
Total Bilirubin: 0.9 mg/dL (ref 0.3–1.2)
Total Protein: 6.6 g/dL (ref 6.5–8.1)

## 2021-06-29 LAB — RESP PANEL BY RT-PCR (FLU A&B, COVID) ARPGX2
Influenza A by PCR: NEGATIVE
Influenza B by PCR: NEGATIVE
SARS Coronavirus 2 by RT PCR: NEGATIVE

## 2021-06-29 LAB — CBC WITH DIFFERENTIAL/PLATELET
Abs Immature Granulocytes: 0.1 10*3/uL — ABNORMAL HIGH (ref 0.00–0.07)
Basophils Absolute: 0 10*3/uL (ref 0.0–0.1)
Basophils Relative: 0 %
Eosinophils Absolute: 0 10*3/uL (ref 0.0–0.5)
Eosinophils Relative: 0 %
HCT: 39.6 % (ref 39.0–52.0)
Hemoglobin: 12.7 g/dL — ABNORMAL LOW (ref 13.0–17.0)
Immature Granulocytes: 1 %
Lymphocytes Relative: 6 %
Lymphs Abs: 1 10*3/uL (ref 0.7–4.0)
MCH: 29.5 pg (ref 26.0–34.0)
MCHC: 32.1 g/dL (ref 30.0–36.0)
MCV: 92.1 fL (ref 80.0–100.0)
Monocytes Absolute: 0.8 10*3/uL (ref 0.1–1.0)
Monocytes Relative: 4 %
Neutro Abs: 15.8 10*3/uL — ABNORMAL HIGH (ref 1.7–7.7)
Neutrophils Relative %: 89 %
Platelets: 107 10*3/uL — ABNORMAL LOW (ref 150–400)
RBC: 4.3 MIL/uL (ref 4.22–5.81)
RDW: 14 % (ref 11.5–15.5)
WBC: 17.6 10*3/uL — ABNORMAL HIGH (ref 4.0–10.5)
nRBC: 0 % (ref 0.0–0.2)

## 2021-06-29 LAB — CBG MONITORING, ED: Glucose-Capillary: 100 mg/dL — ABNORMAL HIGH (ref 70–99)

## 2021-06-29 MED ORDER — BISACODYL 5 MG PO TBEC: 5.0000 mg | DELAYED_RELEASE_TABLET | Freq: Every day | ORAL | Status: DC | PRN

## 2021-06-29 MED ORDER — SODIUM CHLORIDE 0.9 % IV BOLUS
1000.0000 mL | Freq: Once | INTRAVENOUS | Status: AC
  Administered 2021-06-29: 1000 mL via INTRAVENOUS

## 2021-06-29 MED ORDER — HYDROMORPHONE HCL 1 MG/ML IJ SOLN: 0.5000 mg | INTRAMUSCULAR | Status: DC | PRN

## 2021-06-29 MED ORDER — LACTATED RINGERS IV SOLN: INTRAVENOUS | Status: DC

## 2021-06-29 MED ORDER — FLEET ENEMA 7-19 GM/118ML RE ENEM: 1.0000 | ENEMA | Freq: Once | RECTAL | Status: DC | PRN

## 2021-06-29 MED ORDER — SODIUM CHLORIDE 0.9 % IV SOLN
1.0000 g | INTRAVENOUS | Status: DC
  Administered 2021-06-30: 1 g via INTRAVENOUS
  Filled 2021-06-29: qty 10

## 2021-06-29 MED ORDER — POLYETHYLENE GLYCOL 3350 17 G PO PACK: 17.0000 g | PACK | Freq: Every day | ORAL | Status: DC | PRN

## 2021-06-29 MED ORDER — ACETAMINOPHEN 650 MG RE SUPP
650.0000 mg | Freq: Four times a day (QID) | RECTAL | Status: DC | PRN
Start: 2021-06-29 — End: 2021-06-30

## 2021-06-29 MED ORDER — METHOCARBAMOL 500 MG PO TABS
500.0000 mg | ORAL_TABLET | Freq: Three times a day (TID) | ORAL | Status: DC | PRN
  Administered 2021-06-30: 500 mg via ORAL
  Filled 2021-06-29: qty 1

## 2021-06-29 MED ORDER — LORAZEPAM 2 MG/ML IJ SOLN
INTRAMUSCULAR | Status: AC
  Administered 2021-06-29: 0.5 mg
  Filled 2021-06-29: qty 1

## 2021-06-29 MED ORDER — ACETAMINOPHEN 325 MG PO TABS: 650.0000 mg | ORAL_TABLET | Freq: Four times a day (QID) | ORAL | Status: DC | PRN

## 2021-06-29 MED ORDER — ONDANSETRON HCL 4 MG PO TABS: 4.0000 mg | ORAL_TABLET | Freq: Four times a day (QID) | ORAL | Status: DC | PRN

## 2021-06-29 MED ORDER — ONDANSETRON HCL 4 MG/2ML IJ SOLN: 4.0000 mg | Freq: Four times a day (QID) | INTRAMUSCULAR | Status: DC | PRN

## 2021-06-29 MED ORDER — OXYCODONE HCL 5 MG PO TABS: 5.0000 mg | ORAL_TABLET | Freq: Three times a day (TID) | ORAL | Status: DC | PRN

## 2021-06-29 MED ORDER — SODIUM CHLORIDE 0.9 % IV SOLN
1.0000 g | Freq: Once | INTRAVENOUS | Status: AC
  Administered 2021-06-29: 1 g via INTRAVENOUS
  Filled 2021-06-29: qty 10

## 2021-06-29 MED ORDER — ENOXAPARIN SODIUM 40 MG/0.4ML IJ SOSY
40.0000 mg | PREFILLED_SYRINGE | INTRAMUSCULAR | Status: DC
  Administered 2021-06-29: 40 mg via SUBCUTANEOUS
  Filled 2021-06-29: qty 0.4

## 2021-06-29 NOTE — ED Notes (Signed)
Dr.Kakrakandy at bedside to assess pts arm - Per Dr Toniann Fail remove IV and move to different arm. Keep warm compresses on arm and elevate arm. Pulses intact but MD will order doppler for tomorrow

## 2021-06-29 NOTE — ED Notes (Signed)
Notified Dr. Alanda Slim that patient has newly developed swelling to R wrist/forearm area. It is warm to touch, and extremely painful. Pulses strong. No new orders at this time.

## 2021-06-29 NOTE — ED Provider Notes (Signed)
[facility] EMERGENCY DEPARTMENT Provider Note   CSN: [id number] Arrival date & time: 06/28/21  1325     History Chief Complaint  Patient presents with   Weakness    Charles Morgan is a 87 y.o. male.  The history is provided by the patient, medical records, a relative and the EMS personnel.  Weakness  87 y.o. M with hx of arthritis, DVT, HLP, arthritis, aortic stenosis, presenting to the ED with failure to thrive.  Patient has history of dementia and reportedly has been refusing to eat/drink at home.  Was taken to PCP office today and sent to the ED.  He has been weaker than usual and urine has been dark.  He states he does not feel hungry.  He denies any chest pain or SOB.  No abdominal pain.  Past Medical History:  Diagnosis Date   Aortic stenosis    Arthritis    Bradycardia, sinus 2021   Nocturnal bradycardia, not clearly symptomatic or associated with syncope   CVA (cerebral vascular accident) (HCC)    Two events: 2013, 2019   DVT (deep venous thrombosis) (HCC) Aug '20   right LE   HLD (hyperlipidemia)    NSVT (nonsustained ventricular tachycardia)    LVEF normal, not associated with syncope   Proctocolitis without complication Aug '21   CT diagnosis   Syncope and collapse    Multiple recurrences - vertebrobasilar insufficiency, autonomic dysfunction, ILR placed by cardiologist in [place]   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 Neurology eval - no need for further w/u   Vertebrobasilar artery insufficiency     Patient Active Problem List   Diagnosis Date Noted   Corns and callus 11/27/2020   Primary open angle glaucoma 11/27/2020   Pain in wrist 11/27/2020   Onychomycosis 11/27/2020   Old myocardial infarction 11/27/2020   Occupational maladjustment 11/27/2020   Nonspecific reaction to tuberculin skin test 11/27/2020   Neuralgia 11/27/2020   Microscopic hematuria 11/27/2020   Metabolic encephalopathy 11/27/2020    Lung field abnormal 11/27/2020   Localized, primary osteoarthritis of hand 11/27/2020   Intracranial hemorrhage (HCC) 11/27/2020   Inguinal hernia 11/27/2020   Impacted cerumen 11/27/2020   Housing lack 11/27/2020   History of thrombocytopenia 11/27/2020   History of fall 11/27/2020   Groin mass 11/27/2020   Esophageal reflux 11/27/2020   Edema 11/27/2020   Dysphagia 11/27/2020   Cocaine abuse, episodic use (HCC) 11/27/2020   Constipation 11/27/2020   Disorder of oral mucous membrane 11/27/2020   Duodenal ulcer without hemorrhage, perforation, or obstruction 11/27/2020   Benign prostatic hyperplasia 11/27/2020   Atopic dermatitis 11/27/2020   Allergic rhinitis 11/27/2020   Alcohol abuse 11/27/2020   Acute pharyngitis 11/27/2020   Bilateral hand pain 11/27/2020   Mild cognitive impairment 11/27/2020   Neuropraxia of right upper extremity, initial encounter 09/04/2020   Lactic acidosis 09/01/2020   Hypoalbuminemia 09/01/2020   Hyperammonemia (HCC) 09/01/2020   Altered mental status 08/31/2020   CKD (chronic kidney disease) stage 3, GFR 30-59 ml/min (HCC) 06/05/2020   Basilar artery stenosis 06/05/2020   Transaminasemia 06/05/2020   Thrombocytopenia (HCC) 06/05/2020   Syncope 06/04/2020   Dehydration 06/04/2020   AKI (acute kidney injury) (HCC) 06/04/2020   HLD (hyperlipidemia) 06/04/2020   Calcification of aortic valve 06/04/2020   Syncope and collapse 06/04/2020   Ventricular tachycardia 02/18/2020   Adrenal insufficiency (Addison's disease) (HCC) 01/26/2020   Ambulatory dysfunction 01/26/2020   Severe muscle deconditioning 01/12/2020  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   Elevated AST (SGOT) 01/11/2020   History of stroke 01/10/2020   Epigastric pain 12/06/2018   Stroke (HCC) 08/31/2017   Near syncope 11/04/2015    Past Surgical History:  Procedure Laterality Date   ABDOMINAL AORTOGRAM W/LOWER EXTREMITY N/A 11/02/2020   Procedure: ABDOMINAL AORTOGRAM W/LOWER  EXTREMITY;  Surgeon: Maeola Harman, MD;  Location: [facility] INVASIVE CV LAB;  Service: Cardiovascular;  Laterality: N/A;  Bilatera;   ABDOMINAL SURGERY     FLEXIBLE SIGMOIDOSCOPY N/A 06/05/2020   Procedure: FLEXIBLE SIGMOIDOSCOPY;  Surgeon: Malissa Hippo, MD;  Location[facility] ENDO [facility];  Service: Endoscopy;  Laterality: N/A;   HEMORRHOIDECTOMY WITH HEMORRHOID BANDING     LOOP RECORDER IMPLANT     PERIPHERAL VASCULAR ATHERECTOMY  11/02/2020   Procedure: PERIPHERAL VASCULAR ATHERECTOMY;  Surgeon: Maeola Harman, MD;  Location: [facility] INVASIVE CV LAB;  Service: Cardiovascular;;  Laser - AT    SHOULDER SURGERY     Thumb surgery         Family History  Family history unknown: Yes    Social History   Tobacco Use   Smoking status: Never   Smokeless tobacco: Never  Vaping Use   Vaping Use: Never used  Substance Use Topics   Alcohol use: Never   Drug use: Never    Home Medications Prior to Admission medications   Medication Sig Start Date End Date Taking? Authorizing Provider  atorvastatin (LIPITOR) 80 MG tablet Take 1 tablet (80 mg total) by mouth daily. 06/06/20   Dahal, Melina Schools, MD  Brinzolamide-Brimonidine 1-0.2 % SUSP Place 1 drop into both eyes in the morning and at bedtime.  12/12/19   [provider]  clopidogrel (PLAVIX) 75 MG tablet Take 1 tablet (75 mg total) by mouth daily. Patient not taking: Reported on 04/25/2021 11/02/20 11/02/21  Maeola Harman, MD  oxyCODONE-acetaminophen (PERCOCET/ROXICET) 5-325 MG tablet Take 1-2 tablets by mouth every 4 (four) hours as needed for severe pain. Patient not taking: Reported on 04/25/2021 11/27/20   Edwin Cap, DPM  polyethylene glycol (MIRALAX) 17 g packet Take 17 g by mouth daily. Patient not taking: Reported on 04/25/2021 03/26/21   Jacalyn Lefevre, MD    Allergies    Tuberculin  Review of Systems   Review of Systems  Neurological:  Positive for weakness.  All other systems reviewed and are  negative.  Physical Exam Updated Vital Signs BP 105/66 (BP Location: Left Arm)   Pulse 92   Temp (!) 97.5 F (36.4 C) (Oral)   Resp 16   SpO2 99%   Physical Exam Vitals and nursing note reviewed.  Constitutional:      Comments: Thin, cachectic appearing  HENT:     Head: Normocephalic and atraumatic.     Mouth/Throat:     Comments: Lips dry, cracked, mucous membranes are dry Eyes:     Conjunctiva/sclera: Conjunctivae normal.     Pupils: Pupils are equal, round, and reactive to light.  Cardiovascular:     Rate and Rhythm: Normal rate and regular rhythm.     Heart sounds: Normal heart sounds.  Pulmonary:     Effort: Pulmonary effort is normal.     Breath sounds: Normal breath sounds.  Abdominal:     General: Bowel sounds are normal.     Palpations: Abdomen is soft.  Musculoskeletal:        General: Normal range of motion.     Cervical back: Normal range of motion.  Skin:  General: Skin is warm and dry.     Comments: Poor skin turgor  Neurological:     Mental Status: He is alert and oriented to person, place, and time.    ED Results / Procedures / Treatments   Labs (all labs ordered are listed, but only abnormal results are displayed) Labs Reviewed  CBC WITH DIFFERENTIAL/PLATELET - Abnormal; Notable for the following components:      Result Value   WBC 17.6 (*)    Hemoglobin 12.7 (*)    Platelets 107 (*)    Neutro Abs 15.8 (*)    Abs Immature Granulocytes 0.10 (*)    All other components within normal limits  COMPREHENSIVE METABOLIC PANEL - Abnormal; Notable for the following components:   Glucose, Bld 130 (*)    BUN 35 (*)    Albumin 2.1 (*)    AST 70 (*)    All other components within normal limits  URINE CULTURE  URINALYSIS, ROUTINE W REFLEX MICROSCOPIC    EKG EKG Interpretation  Date/Time:  Monday June 28 2021 23:33:50 EDT Ventricular Rate:  90 PR Interval:  132 QRS Duration: 88 QT Interval:  360 QTC Calculation: 440 R Axis:   49 Text  Interpretation: Sinus rhythm with occasional Premature ventricular complexes Minimal voltage criteria for LVH, may be normal variant ( Sokolow-Lyon ) Confirmed by Palumbo, April ([id number]) on 06/29/2021 4:49:08 AM  Radiology DG Chest 1 View  Result Date: 06/28/2021 CLINICAL DATA:  Weakness for 1 day. EXAM: CHEST  1 VIEW COMPARISON:  04/23/2021 and older exams FINDINGS: Cardiac silhouette is normal in size. No mediastinal or hilar masses. Stable left chest wall loop recorder. Lungs are clear.  No pleural effusion or pneumothorax. Skeletal structures are grossly intact. IMPRESSION: No acute cardiopulmonary disease. Electronically Signed   By: Amie Portland M.D.   On: 06/28/2021 16:19    Procedures Procedures   Medications Ordered in ED Medications - No data to display  ED Course  I have reviewed the triage vital signs and the nursing notes.  Pertinent labs & imaging results that were available during my care of the patient were reviewed by me and considered in my medical decision making (see chart for details).    MDM Rules/Calculators/A&P                           87 y.o. M here with generalized weakness and failure to thrive.  Seen by PCP and sent here.  Family reports he is not eating/drinking and has dark urine.  On exam, he is elderly, thin, and cachectic appearing.  He is clinically dry with cracked lips, dry mucous membranes.   Labs as above-- leukocytosis at 17.6.  also signs of dehydration with BUN 35.  No fever, tachycardia, or hypotension.  Do not suspect acute sepsis but may have UTI given reported dark urine.  Given IVFB.  Will need in and out cath.  I anticipate admission for failure to thrive.  IVFB and in and out/UA delayed due to other acute patients.  Care signed out to oncoming provider.  Anticipate admission.  Final Clinical Impression(s) / ED Diagnoses Final diagnoses:  Failure to thrive in adult  Dehydration    Rx / DC Orders ED Discharge Orders     None         Garlon Hatchet, PA-C 06/29/21 [id number]    Nicanor Alcon, April, MD 06/29/21 [phone]

## 2021-06-29 NOTE — ED Provider Notes (Signed)
  Physical Exam  BP (!) 146/78   Pulse 95   Temp (!) 97.5 F (36.4 C) (Oral)   Resp (!) 23   SpO2 98%   Physical Exam Vitals and nursing note reviewed.  Constitutional:      Appearance: He is ill-appearing.     Comments: cachetic  HENT:     Mouth/Throat:     Mouth: Mucous membranes are dry.  Cardiovascular:     Rate and Rhythm: Normal rate.  Pulmonary:     Effort: Pulmonary effort is normal.  Abdominal:     General: Abdomen is flat.  Musculoskeletal:     Cervical back: Normal range of motion and neck supple.  Skin:    General: Skin is warm.  Neurological:     Mental Status: He is alert. Mental status is at baseline.    ED Course/Procedures   Clinical Course as of 06/29/21 1049  Tue Jun 29, 2021  0707 WBC(!): 17.6 [JS]  1013 Nitrite(!): POSITIVE [JS]  1014 Leukocytes,Ua(!): LARGE [JS]  1014 Bacteria, UA(!): MANY [JS]    Clinical Course User Index [JS] Claude Manges, PA-C    Procedures  MDM  Patient care assumed from [place]. PA at shift change, please see her note for a full HPI. Briefly, patient here with failure to thrive, seen at PCP today and referred to the ED. Decreased in PO intake, weaker than usual, does not feel hungry. Plan is for pending UA,culture added.    07:30 AM Patient evaluate by me along with my attending Dr. Particia Nearing, appears dry, no guarding along the abdomen.   UA positive for nitrites, Large leukocytes, many bacteria, will provide him with IV rocephin along with call hospitalist for further admission and management.  Spoke to hospitalist service who will admit patient for further management.   BP (!) 146/78   Pulse 95   Temp (!) 97.5 F (36.4 C) (Oral)   Resp (!) 23   SpO2 98%      Portions of this note were generated with Scientist, clinical (histocompatibility and immunogenetics). Dictation errors may occur despite best attempts at proofreading.         Claude Manges, PA-C 06/29/21 1049    Palumbo, April, MD 06/29/21 2351

## 2021-06-29 NOTE — H&P (Addendum)
History and Physical    Charles Morgan MBW:[id number] DOB: September 30, 1929 DOA: 06/28/2021  PCP: System, Provider Not In Patient coming from: Home.  Lives with granddaughter, Charles Morgan.  Totally dependent for ADL's.  Not able to use walker anymore.  Chief Complaint: Generalized weakness and decreased oral intake  HPI: Charles Morgan is a 87 y.o. male with history of severe dementia dependent for all ADL's, CVA, CKD-3, PAD, thrombocytopenia, chronic pain and failure to thrive presenting with generalized weakness and decreased oral intake.  Patient with severe dementia.  Oriented to self and barely follows commands.  History obtained from patient's daughter, granddaughter and chart review.  Patient was enrolled with home hospice when he was in [place] with his daughter, Charles Morgan.  He moved down to [place] as his daughter was not able to care for him due to his own health condition.  He was at rehab in [place] for some time to rehabilitate his right hand weakness and pain after he had a fall which did not improve.  He recently went home from rehab to stay with his granddaughter, Charles Morgan.  He has been progressively getting weaker.  He also refuses to eat and drink.  He complains pain but not able to localize.  No report of fever, respiratory symptoms, GI symptoms or UTI symptoms.  Per patient's daughter and granddaughter, they have been trying to get him home hospice since he came home.   We also discussed about patient's CODE STATUS and goals of care.  We discussed about the pros and cons of CPR and intubation.  Given his severe dementia, quality of life and fraility, those aggressive measures could pose more harm than benefit.  They both felt the same way, and we have agreed on DNR and DNI which is to the patient's best interest.  We have also discussed about lab finding about possible UTI.  They are open to short-term antibiotics to treat a UTI and IV fluid for hydration.  Charles Morgan states that the goal is to  emphasize comfort by treating pain and distress.  Plan is discharged home with home hospice once treated for UTI and hydrated for dehydration.  I have requested palliative care and transition of care consult.  Patient has no dedicated [facility] POA but Charles Morgan is on board with whatever Charles Morgan decides.   In ED, hemodynamically stable.  98% on RA.  AST 70.  Cr 0.98.  WBC 17.8 with left shift.  Hgb 12.7.  Platelet 207.  Otherwise, CMP and CBC without significant.  UA concerning for UTI.  CXR without acute finding.  ROS Not able to perform review of system due to patient's mental status/severe dementia.  PMH Past Medical History:  Diagnosis Date   Aortic stenosis    Arthritis    Bradycardia, sinus 2021   Nocturnal bradycardia, not clearly symptomatic or associated with syncope   CVA (cerebral vascular accident) (HCC)    Two events: 2013, 2019   DVT (deep venous thrombosis) (HCC) Aug '20   right LE   HLD (hyperlipidemia)    NSVT (nonsustained ventricular tachycardia)    LVEF normal, not associated with syncope   Proctocolitis without complication Aug '21   CT diagnosis   Syncope and collapse    Multiple recurrences - vertebrobasilar insufficiency, autonomic dysfunction, ILR placed by cardiologist in [place]   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 Neurology eval - no need for further w/u   Vertebrobasilar artery insufficiency    PSH Past Surgical History:  Procedure Laterality Date   ABDOMINAL AORTOGRAM W/LOWER EXTREMITY N/A 11/02/2020   Procedure: ABDOMINAL AORTOGRAM W/LOWER EXTREMITY;  Surgeon: Maeola Harman, MD;  Location: [facility] INVASIVE CV LAB;  Service: Cardiovascular;  Laterality: N/A;  Bilatera;   ABDOMINAL SURGERY     FLEXIBLE SIGMOIDOSCOPY N/A 06/05/2020   Procedure: FLEXIBLE SIGMOIDOSCOPY;  Surgeon: Malissa Hippo, MD;  Location[facility];  Service: Endoscopy;  Laterality: N/A;   HEMORRHOIDECTOMY WITH HEMORRHOID BANDING     LOOP RECORDER  IMPLANT     PERIPHERAL VASCULAR ATHERECTOMY  11/02/2020   Procedure: PERIPHERAL VASCULAR ATHERECTOMY;  Surgeon: Maeola Harman, MD;  Location: [facility] INVASIVE CV LAB;  Service: Cardiovascular;;  Laser - AT    SHOULDER SURGERY     Thumb surgery     Fam HX Family History  Family history unknown: Yes    Social Hx  reports that he has never smoked. He has never used smokeless tobacco. He reports that he does not drink alcohol and does not use drugs.  Allergy Allergies  Allergen Reactions   Tuberculin Rash   Home Meds Prior to Admission medications   Medication Sig Start Date End Date Taking? Authorizing Provider  atorvastatin (LIPITOR) 80 MG tablet Take 1 tablet (80 mg total) by mouth daily. 06/06/20  Yes Dahal, Melina Schools, MD  Brinzolamide-Brimonidine 1-0.2 % SUSP Place 1 drop into both eyes in the morning and at bedtime.  12/12/19  Yes [provider]  clopidogrel (PLAVIX) 75 MG tablet Take 1 tablet (75 mg total) by mouth daily. Patient not taking: No sig reported 11/02/20 11/02/21  Maeola Harman, MD    Physical Exam: Vitals:   06/29/21 0604 06/29/21 0702 06/29/21 0858 06/29/21 1205  BP: 126/80 121/63 (!) 146/78   Pulse: 90 60 95   Resp: 16  (!) 23   Temp:    99.5 F (37.5 C)  TempSrc:    Rectal  SpO2: 100% 98% 98%     GENERAL: Very frail.  No apparent distress. HEENT: MMM.  Vision and hearing grossly intact.  NECK: Supple.  No apparent JVD.  RESP: 98% on RA.  No IWOB. Good air movement bilaterally. CVS:  RRR . Heart sounds normal.  ABD/GI/GU: Bowel sounds present. Soft. Non tender.  MSK/EXT:   Significant muscle mass and subcu fat loss.  Seems to have some lower extremity contractures SKIN: Foul-smelling wound on the base of his left great toe medially NEURO: Awake.  Only oriented to self.  Follows some commands.  No gross deficit but difficult exam due to his severe dementia and contractures. PSYCH: Calm.  No distress or agitation.   Personally  Reviewed Radiological Exams DG Chest 1 View  Result Date: 06/28/2021 CLINICAL DATA:  Weakness for 1 day. EXAM: CHEST  1 VIEW COMPARISON:  04/23/2021 and older exams FINDINGS: Cardiac silhouette is normal in size. No mediastinal or hilar masses. Stable left chest wall loop recorder. Lungs are clear.  No pleural effusion or pneumothorax. Skeletal structures are grossly intact. IMPRESSION: No acute cardiopulmonary disease. Electronically Signed   By: Amie Portland M.D.   On: 06/28/2021 16:19     Personally Reviewed Labs: CBC: Recent Labs  Lab 06/28/21 2340  WBC 17.6*  NEUTROABS 15.8*  HGB 12.7*  HCT 39.6  MCV 92.1  PLT 107*   Basic Metabolic Panel: Recent Labs  Lab 06/28/21 2340  NA 143  K 3.9  CL 110  CO2 25  GLUCOSE 130*  BUN 35*  CREATININE 0.98  CALCIUM 9.4  GFR: CrCl cannot be calculated (Unknown ideal weight.). Liver Function Tests: Recent Labs  Lab 06/28/21 2340  AST 70*  ALT 35  ALKPHOS 69  BILITOT 0.9  PROT 6.6  ALBUMIN 2.1*   No results for input(s): LIPASE, AMYLASE in the last 168 hours. No results for input(s): AMMONIA in the last 168 hours. Coagulation Profile: No results for input(s): INR, PROTIME in the last 168 hours. Cardiac Enzymes: No results for input(s): CKTOTAL, CKMB, CKMBINDEX, TROPONINI in the last 168 hours. BNP (last 3 results) No results for input(s): PROBNP in the last 8760 hours. HbA1C: No results for input(s): HGBA1C in the last 72 hours. CBG: Recent Labs  Lab 06/29/21 1200  GLUCAP 100*   Lipid Profile: No results for input(s): CHOL, HDL, LDLCALC, TRIG, CHOLHDL, LDLDIRECT in the last 72 hours. Thyroid Function Tests: No results for input(s): TSH, T4TOTAL, FREET4, T3FREE, THYROIDAB in the last 72 hours. Anemia Panel: No results for input(s): VITAMINB12, FOLATE, FERRITIN, TIBC, IRON, RETICCTPCT in the last 72 hours. Urine analysis:    Component Value Date/Time   COLORURINE YELLOW 06/29/2021 0920   APPEARANCEUR HAZY (A)  06/29/2021 0920   LABSPEC 1.019 06/29/2021 0920   PHURINE 5.0 06/29/2021 0920   GLUCOSEU NEGATIVE 06/29/2021 0920   HGBUR MODERATE (A) 06/29/2021 0920   BILIRUBINUR NEGATIVE 06/29/2021 0920   KETONESUR NEGATIVE 06/29/2021 0920   PROTEINUR 30 (A) 06/29/2021 0920   NITRITE POSITIVE (A) 06/29/2021 0920   LEUKOCYTESUR LARGE (A) 06/29/2021 0920    Sepsis Labs:  None  Personally Reviewed EKG:  EKG sinus rhythm with LVH.  Assessment/Plan Goal of care counseling-DNR/DNI as above. -Treat UTI, hydrate and transfer home with home hospice -Manage pain and other symptoms as needed  Possible UTI-UA with nitrite, large LE, many bacteria and moderate Hgb -Continue IV ceftriaxone -Follow urine cultures  Severe dementia without behavioral disturbance/failure to thrive/frailty -See goal of care discussion above -Treat acute issues and focus on comfort -Delirium precautions  Chronic pain-not able to localize.  Seems to have some lower extremity contractures -Tylenol, Robaxin, oxycodone and IV Dilaudid as needed.  Severe malnutrition: As evidenced by significant muscle mass and subcu fat loss.  Elevated AST: -Check CK -Hydrate as above  Leukocytosis/bandemia-probably from UTI.  Normocytic anemia/thrombocytopenia-chronic -Recheck  Left foot wound/ulcer-has foul-smelling wound at the base of left great toe medially. -WOCN consulted -Check x-ray  DVT prophylaxis: Subcu Lovenox  Code Status: DNR/DNI Family Communication: Updated patient's daughter and granddaughter  Disposition Plan: Admit to MedSurg Consults called: Palliative care Admission status: Observation Level of care: Med-Surg   Charles Hercules MD [facility]  If 7PM-7AM, please contact night-coverage [email or web address]  06/29/2021, 12:29 PM

## 2021-06-29 NOTE — ED Notes (Signed)
Patient brief changed.

## 2021-06-29 NOTE — Progress Notes (Signed)
Patient's nurse notified me that patient's right upper extremity distal half of the right forearm has been noticed to be swollen and warm to touch with pain.  On exam at bedside, patient's right forearm appears swollen distal to the mid half of the right forearm with good pulses.  Warm to touch.  No obvious restriction to movement.  Requested nurse to change the IV to left upper extremity and keep the right forearm elevated and will order Dopplers.  Closely monitor.  Charles Morgan

## 2021-06-29 NOTE — ED Notes (Signed)
Pt experiencing jerking motion with out stretched arms. Pt is alert and answering questions in accordance with baseline. MD Haviland verbal order for 0.5mg  IV Ativan.

## 2021-06-29 NOTE — ED Notes (Signed)
Patient transported to X-ray 

## 2021-06-30 ENCOUNTER — Observation Stay (HOSPITAL_COMMUNITY): Payer: Medicare HMO

## 2021-06-30 DIAGNOSIS — N3000 Acute cystitis without hematuria: Secondary | ICD-10-CM

## 2021-06-30 DIAGNOSIS — G8929 Other chronic pain: Secondary | ICD-10-CM | POA: Diagnosis present

## 2021-06-30 DIAGNOSIS — L97529 Non-pressure chronic ulcer of other part of left foot with unspecified severity: Secondary | ICD-10-CM

## 2021-06-30 DIAGNOSIS — R627 Adult failure to thrive: Secondary | ICD-10-CM | POA: Diagnosis present

## 2021-06-30 DIAGNOSIS — M7989 Other specified soft tissue disorders: Secondary | ICD-10-CM | POA: Diagnosis not present

## 2021-06-30 DIAGNOSIS — Z515 Encounter for palliative care: Secondary | ICD-10-CM | POA: Diagnosis not present

## 2021-06-30 DIAGNOSIS — Z20822 Contact with and (suspected) exposure to covid-19: Secondary | ICD-10-CM | POA: Diagnosis present

## 2021-06-30 DIAGNOSIS — D696 Thrombocytopenia, unspecified: Secondary | ICD-10-CM | POA: Diagnosis present

## 2021-06-30 DIAGNOSIS — E860 Dehydration: Secondary | ICD-10-CM | POA: Diagnosis not present

## 2021-06-30 DIAGNOSIS — Z66 Do not resuscitate: Secondary | ICD-10-CM | POA: Diagnosis present

## 2021-06-30 DIAGNOSIS — M79601 Pain in right arm: Secondary | ICD-10-CM

## 2021-06-30 DIAGNOSIS — L89899 Pressure ulcer of other site, unspecified stage: Secondary | ICD-10-CM | POA: Diagnosis present

## 2021-06-30 DIAGNOSIS — R509 Fever, unspecified: Secondary | ICD-10-CM

## 2021-06-30 DIAGNOSIS — N3001 Acute cystitis with hematuria: Secondary | ICD-10-CM

## 2021-06-30 DIAGNOSIS — B9561 Methicillin susceptible Staphylococcus aureus infection as the cause of diseases classified elsewhere: Secondary | ICD-10-CM | POA: Diagnosis present

## 2021-06-30 DIAGNOSIS — R64 Cachexia: Secondary | ICD-10-CM | POA: Diagnosis present

## 2021-06-30 DIAGNOSIS — Z6820 Body mass index (BMI) 20.0-20.9, adult: Secondary | ICD-10-CM | POA: Diagnosis not present

## 2021-06-30 DIAGNOSIS — E43 Unspecified severe protein-calorie malnutrition: Secondary | ICD-10-CM | POA: Diagnosis present

## 2021-06-30 DIAGNOSIS — M1990 Unspecified osteoarthritis, unspecified site: Secondary | ICD-10-CM | POA: Diagnosis present

## 2021-06-30 DIAGNOSIS — Z888 Allergy status to other drugs, medicaments and biological substances status: Secondary | ICD-10-CM | POA: Diagnosis not present

## 2021-06-30 DIAGNOSIS — D649 Anemia, unspecified: Secondary | ICD-10-CM | POA: Diagnosis present

## 2021-06-30 DIAGNOSIS — E785 Hyperlipidemia, unspecified: Secondary | ICD-10-CM | POA: Diagnosis present

## 2021-06-30 DIAGNOSIS — N390 Urinary tract infection, site not specified: Secondary | ICD-10-CM | POA: Diagnosis present

## 2021-06-30 DIAGNOSIS — N183 Chronic kidney disease, stage 3 unspecified: Secondary | ICD-10-CM | POA: Diagnosis present

## 2021-06-30 DIAGNOSIS — Z79899 Other long term (current) drug therapy: Secondary | ICD-10-CM | POA: Diagnosis not present

## 2021-06-30 DIAGNOSIS — I350 Nonrheumatic aortic (valve) stenosis: Secondary | ICD-10-CM | POA: Diagnosis present

## 2021-06-30 DIAGNOSIS — Z8673 Personal history of transient ischemic attack (TIA), and cerebral infarction without residual deficits: Secondary | ICD-10-CM | POA: Diagnosis not present

## 2021-06-30 DIAGNOSIS — Z86718 Personal history of other venous thrombosis and embolism: Secondary | ICD-10-CM | POA: Diagnosis not present

## 2021-06-30 DIAGNOSIS — F03C0 Unspecified dementia, severe, without behavioral disturbance, psychotic disturbance, mood disturbance, and anxiety: Secondary | ICD-10-CM | POA: Diagnosis present

## 2021-06-30 DIAGNOSIS — R7401 Elevation of levels of liver transaminase levels: Secondary | ICD-10-CM | POA: Diagnosis present

## 2021-06-30 DIAGNOSIS — Z789 Other specified health status: Secondary | ICD-10-CM

## 2021-06-30 LAB — COMPREHENSIVE METABOLIC PANEL
ALT: 35 U/L (ref 0–44)
AST: 69 U/L — ABNORMAL HIGH (ref 15–41)
Albumin: 1.9 g/dL — ABNORMAL LOW (ref 3.5–5.0)
Alkaline Phosphatase: 75 U/L (ref 38–126)
Anion gap: 11 (ref 5–15)
BUN: 28 mg/dL — ABNORMAL HIGH (ref 8–23)
CO2: 23 mmol/L (ref 22–32)
Calcium: 9 mg/dL (ref 8.9–10.3)
Chloride: 113 mmol/L — ABNORMAL HIGH (ref 98–111)
Creatinine, Ser: 0.82 mg/dL (ref 0.61–1.24)
GFR, Estimated: >60 mL/min (ref >60)
Glucose, Bld: 101 mg/dL — ABNORMAL HIGH (ref 70–99)
Potassium: 3.7 mmol/L (ref 3.5–5.1)
Sodium: 147 mmol/L — ABNORMAL HIGH (ref 135–145)
Total Bilirubin: 0.9 mg/dL (ref 0.3–1.2)
Total Protein: 6 g/dL — ABNORMAL LOW (ref 6.5–8.1)

## 2021-06-30 LAB — CBC
HCT: 38.8 % — ABNORMAL LOW (ref 39.0–52.0)
Hemoglobin: 12.5 g/dL — ABNORMAL LOW (ref 13.0–17.0)
MCH: 29.8 pg (ref 26.0–34.0)
MCHC: 32.2 g/dL (ref 30.0–36.0)
MCV: 92.6 fL (ref 80.0–100.0)
Platelets: 111 10*3/uL — ABNORMAL LOW (ref 150–400)
RBC: 4.19 MIL/uL — ABNORMAL LOW (ref 4.22–5.81)
RDW: 14.2 % (ref 11.5–15.5)
WBC: 17.8 10*3/uL — ABNORMAL HIGH (ref 4.0–10.5)
nRBC: 0 % (ref 0.0–0.2)

## 2021-06-30 LAB — PHOSPHORUS: Phosphorus: 3.1 mg/dL (ref 2.5–4.6)

## 2021-06-30 LAB — CK: Total CK: 825 U/L — ABNORMAL HIGH (ref 49–397)

## 2021-06-30 LAB — MAGNESIUM: Magnesium: 2.2 mg/dL (ref 1.7–2.4)

## 2021-06-30 MED ORDER — ONDANSETRON HCL 4 MG/2ML IJ SOLN: 4.0000 mg | Freq: Four times a day (QID) | INTRAMUSCULAR | Status: DC | PRN

## 2021-06-30 MED ORDER — HALOPERIDOL LACTATE 5 MG/ML IJ SOLN: 2.0000 mg | Freq: Four times a day (QID) | INTRAMUSCULAR | Status: DC | PRN

## 2021-06-30 MED ORDER — ATORVASTATIN CALCIUM 40 MG PO TABS: 80.0000 mg | ORAL_TABLET | Freq: Every day | ORAL | Status: DC

## 2021-06-30 MED ORDER — GLYCOPYRROLATE 1 MG PO TABS
1.0000 mg | ORAL_TABLET | ORAL | Status: DC | PRN
  Filled 2021-06-30: qty 1

## 2021-06-30 MED ORDER — BIOTENE DRY MOUTH MT LIQD
15.0000 mL | Freq: Two times a day (BID) | OROMUCOSAL | Status: DC
  Administered 2021-06-30 – 2021-07-01 (×2): 15 mL via TOPICAL

## 2021-06-30 MED ORDER — HYDROMORPHONE HCL 1 MG/ML IJ SOLN: 0.5000 mg | INTRAMUSCULAR | Status: DC | PRN

## 2021-06-30 MED ORDER — MORPHINE SULFATE (CONCENTRATE) 10 MG/0.5ML PO SOLN: 5.0000 mg | ORAL | Status: DC | PRN

## 2021-06-30 MED ORDER — ACETAMINOPHEN 325 MG PO TABS: 650.0000 mg | ORAL_TABLET | Freq: Four times a day (QID) | ORAL | Status: DC | PRN

## 2021-06-30 MED ORDER — GLYCOPYRROLATE 0.2 MG/ML IJ SOLN: 0.2000 mg | INTRAMUSCULAR | Status: DC | PRN

## 2021-06-30 MED ORDER — HALOPERIDOL LACTATE 2 MG/ML PO CONC
2.0000 mg | Freq: Four times a day (QID) | ORAL | Status: DC | PRN
  Filled 2021-06-30: qty 1

## 2021-06-30 MED ORDER — LORAZEPAM 2 MG/ML PO CONC: 1.0000 mg | ORAL | Status: DC | PRN

## 2021-06-30 MED ORDER — SODIUM CHLORIDE 0.9 % IV SOLN: 2.0000 g | INTRAVENOUS | Status: DC

## 2021-06-30 MED ORDER — ACETAMINOPHEN 650 MG RE SUPP: 650.0000 mg | Freq: Four times a day (QID) | RECTAL | Status: DC | PRN

## 2021-06-30 MED ORDER — HALOPERIDOL 1 MG PO TABS
2.0000 mg | ORAL_TABLET | Freq: Four times a day (QID) | ORAL | Status: DC | PRN
  Filled 2021-06-30: qty 2

## 2021-06-30 MED ORDER — POLYVINYL ALCOHOL 1.4 % OP SOLN: 1.0000 [drp] | Freq: Four times a day (QID) | OPHTHALMIC | Status: DC | PRN

## 2021-06-30 MED ORDER — LORAZEPAM 1 MG PO TABS: 1.0000 mg | ORAL_TABLET | ORAL | Status: DC | PRN

## 2021-06-30 MED ORDER — ONDANSETRON 4 MG PO TBDP: 4.0000 mg | ORAL_TABLET | Freq: Four times a day (QID) | ORAL | Status: DC | PRN

## 2021-06-30 MED ORDER — LORAZEPAM 2 MG/ML IJ SOLN: 1.0000 mg | INTRAMUSCULAR | Status: DC | PRN

## 2021-06-30 NOTE — Progress Notes (Addendum)
PROGRESS NOTE  Charles Morgan LPF:[id number] DOB: 05/29/1930 DOA: 06/28/2021 PCP: System, Provider Not In  HPI/Recap of past 24 hours: Charles Morgan is a 87 y.o. male with history of severe dementia dependent for all ADL's, CVA, CKD-3, PAD, thrombocytopenia, chronic pain and failure to thrive presenting with generalized weakness and decreased oral intake.  Per patient's daughter and granddaughter, they have been trying to get him home hospice since he came home.  They are open to short-term antibiotics to treat UTI and IV fluid for hydration.  Gavin Pound states that the goal is to emphasize comfort by treating pain and distress.  Plan is discharged home with home hospice once treated for UTI and hydrated for dehydration.  Palliative care and transition of care consulted to assist with dc planning.  06/30/21: Seen and examined at his bedside.  He is alert and pleasantly confused.  Pain in his R UE.  No other complaints.  Assessment/Plan: Active Problems:   UTI (urinary tract infection)  Generalized weakness/poor oral intake, likely contributed by staff aureus UTI UA positive for pyuria on admission, urine culture growing greater than 100,000 colonies of staph aureus. Is currently on Rocephin empirically Follow urine culture for sensitivities Encourage increase in oral protein calorie intake Goals of care and discussion between previous provider and family- DNR and plan for possible comfort care  Left foot wound with necrotic left first metatarsal head, POA Obtain MRSA screening Obtain blood cultures peripherally x2 Continue empiric IV antibiotics Continue gentle IV fluid hydration. Seen by wound care specialist Continue local wound care Monitor fever curve and WBC.  Elevated AST, unclear etiology Alkaline phosphatase, ALT and T bilirubin normal.  Hyperlipidemia Resume home Lipitor and closely monitor LFTs  Advanced dementia Reorient as needed Fall/aspiration precautions.  Goals of  care Palliative care team consulted to assist with establishing goals of care. DNR    Code Status: DNR.  Family Communication: None at bedside.  Disposition Plan: Undetermined.  Ongoing goals of care discussion.   Consultants: Palliative care team Wound care specialist  Procedures: None  Antimicrobials: Rocephin  DVT prophylaxis: Subcu Lovenox daily  Status is: Inpatient status.  Patient will require at least 2 midnights for further evaluation and treatment of present condition.    Dispo: The patient is from: Home.              Anticipated d/c is to: Undetermined              Patient currently not medically stable for discharge.   Difficult to place patient not applicable.        Objective: Vitals:   06/30/21 0522 06/30/21 0814 06/30/21 1141 06/30/21 1415  BP: 124/90 131/80 120/80 119/84  Pulse: 81 83 93 91  Resp: (!) 25 20 18 18   Temp:      TempSrc:      SpO2: 100% 100% 95% 93%  Weight:        Intake/Output Summary (Last 24 hours) at 06/30/2021 1447 Last data filed at 06/30/2021 1118 Gross per 24 hour  Intake 100 ml  Output --  Net 100 ml   Filed Weights   06/29/21 1205  Weight: 59 kg    Exam:  General: 87 y.o. year-old male, frail, in no acute distress.  Alert and pleasantly confused. Cardiovascular: Regular rate and rhythm with no rubs or gallops.  No thyromegaly or JVD noted.   Respiratory: Clear to auscultation with no wheezes or rales. Good inspiratory effort. Abdomen: Soft nontender nondistended with normal bowel  sounds x4 quadrants. Musculoskeletal: No lower extremity edema.  Left foot wound involving first metatarsal. Skin: First left metatarsal wound noted. Psychiatry: Mood is appropriate for condition and setting   Data Reviewed: CBC: Recent Labs  Lab 06/28/21 2340 06/30/21 0541  WBC 17.6* 17.8*  NEUTROABS 15.8*  --   HGB 12.7* 12.5*  HCT 39.6 38.8*  MCV 92.1 92.6  PLT 107* 111*   Basic Metabolic Panel: Recent Labs   Lab 06/28/21 2340 06/30/21 0541  NA 143 147*  K 3.9 3.7  CL 110 113*  CO2 25 23  GLUCOSE 130* 101*  BUN 35* 28*  CREATININE 0.98 0.82  CALCIUM 9.4 9.0  MG  --  2.2  PHOS  --  3.1   GFR: Estimated Creatinine Clearance: 52 mL/min (by C-G formula based on SCr of 0.82 mg/dL). Liver Function Tests: Recent Labs  Lab 06/28/21 2340 06/30/21 0541  AST 70* 69*  ALT 35 35  ALKPHOS 69 75  BILITOT 0.9 0.9  PROT 6.6 6.0*  ALBUMIN 2.1* 1.9*   No results for input(s): LIPASE, AMYLASE in the last 168 hours. No results for input(s): AMMONIA in the last 168 hours. Coagulation Profile: No results for input(s): INR, PROTIME in the last 168 hours. Cardiac Enzymes: Recent Labs  Lab 06/30/21 0541  CKTOTAL 825*   BNP (last 3 results) No results for input(s): PROBNP in the last 8760 hours. HbA1C: No results for input(s): HGBA1C in the last 72 hours. CBG: Recent Labs  Lab 06/29/21 1200  GLUCAP 100*   Lipid Profile: No results for input(s): CHOL, HDL, LDLCALC, TRIG, CHOLHDL, LDLDIRECT in the last 72 hours. Thyroid Function Tests: No results for input(s): TSH, T4TOTAL, FREET4, T3FREE, THYROIDAB in the last 72 hours. Anemia Panel: No results for input(s): VITAMINB12, FOLATE, FERRITIN, TIBC, IRON, RETICCTPCT in the last 72 hours. Urine analysis:    Component Value Date/Time   COLORURINE YELLOW 06/29/2021 0920   APPEARANCEUR HAZY (A) 06/29/2021 0920   LABSPEC 1.019 06/29/2021 0920   PHURINE 5.0 06/29/2021 0920   GLUCOSEU NEGATIVE 06/29/2021 0920   HGBUR MODERATE (A) 06/29/2021 0920   BILIRUBINUR NEGATIVE 06/29/2021 0920   KETONESUR NEGATIVE 06/29/2021 0920   PROTEINUR 30 (A) 06/29/2021 0920   NITRITE POSITIVE (A) 06/29/2021 0920   LEUKOCYTESUR LARGE (A) 06/29/2021 0920   Sepsis Labs: @LABRCNTIP (procalcitonin:4,lacticidven:4)  ) Recent Results (from the past 240 hour(s))  Urine Culture     Status: Abnormal (Preliminary result)   Collection Time: 06/29/21  9:26 AM    Specimen: Urine, Clean Catch  Result Value Ref Range Status   Specimen Description URINE, CLEAN CATCH  Final   Special Requests NONE  Final   Culture (A)  Final    >=100,000 COLONIES/mL STAPHYLOCOCCUS AUREUS SUSCEPTIBILITIES TO FOLLOW Performed at [facility], [address], [place], [place]    Report Status PENDING  Incomplete  Resp Panel by RT-PCR (Flu A&B, Covid) Nasopharyngeal Swab     Status: None   Collection Time: 06/29/21 10:49 AM   Specimen: Nasopharyngeal Swab; Nasopharyngeal(NP) swabs in vial transport medium  Result Value Ref Range Status   SARS Coronavirus 2 by RT PCR NEGATIVE NEGATIVE Final    Comment: (NOTE) SARS-CoV-2 target nucleic acids are NOT DETECTED.  The SARS-CoV-2 RNA is generally detectable in upper respiratory specimens during the acute phase of infection. The lowest concentration of SARS-CoV-2 viral copies this assay can detect is 138 copies/mL. A negative result does not preclude SARS-Cov-2 infection and should not be used  as the sole basis for treatment or other patient management decisions. A negative result may occur with  improper specimen collection/handling, submission of specimen other than nasopharyngeal swab, presence of viral mutation(s) within the areas targeted by this assay, and inadequate number of viral copies(<138 copies/mL). A negative result must be combined with clinical observations, patient history, and epidemiological information. The expected result is Negative.  Fact Sheet for Patients:  BloggerCourse.com  Fact Sheet for Healthcare Providers:  SeriousBroker.it  This test is no t yet approved or cleared by the [place] FDA and  has been authorized for detection and/or diagnosis of SARS-CoV-2 by FDA under an Emergency Use Authorization (EUA). This EUA will remain  in effect (meaning this test can be used) for the duration of the COVID-19 declaration under  Section 564(b)(1) of the Act, 21 U.S.C.section 360bbb-3(b)(1), unless the authorization is terminated  or revoked sooner.       Influenza A by PCR NEGATIVE NEGATIVE Final   Influenza B by PCR NEGATIVE NEGATIVE Final    Comment: (NOTE) The Xpert Xpress SARS-CoV-2/FLU/RSV plus assay is intended as an aid in the diagnosis of influenza from Nasopharyngeal swab specimens and should not be used as a sole basis for treatment. Nasal washings and aspirates are unacceptable for Xpert Xpress SARS-CoV-2/FLU/RSV testing.  Fact Sheet for Patients: BloggerCourse.com  Fact Sheet for Healthcare Providers: SeriousBroker.it  This test is not yet approved or cleared by the [place] FDA and has been authorized for detection and/or diagnosis of SARS-CoV-2 by FDA under an Emergency Use Authorization (EUA). This EUA will remain in effect (meaning this test can be used) for the duration of the COVID-19 declaration under Section 564(b)(1) of the Act, 21 U.S.C. section 360bbb-3(b)(1), unless the authorization is terminated or revoked.  Performed at [facility], [address], [place], [place] [address]       Studies: DG Foot 2 Views Left  Result Date: 06/29/2021 CLINICAL DATA:  Left foot wound. EXAM: LEFT FOOT - 2 VIEW COMPARISON:  November 05, 2020. FINDINGS: Status post surgical amputation of the second toe. Moderate hallux valgus deformity is seen involving the first metatarsophalangeal joint with moderate degenerative joint disease in this joint. Overlying soft tissue ulceration is seen medial to the distal first metatarsal. No definite lytic destruction is seen to suggest osteomyelitis. No definite fracture is noted. IMPRESSION: Moderate hallux valgus deformity is seen involving first metatarsophalangeal joint with moderate degenerative joint disease of this joint as well. Soft tissue ulceration is seen overlying the distal portion of the  first metatarsal without definite evidence of osteomyelitis. Electronically Signed   By: Lupita Raider M.D.   On: 06/29/2021 15:50   VAS [place] UPPER EXTREMITY VENOUS DUPLEX  Result Date: 06/30/2021 UPPER VENOUS STUDY  Patient Name:  TYDEN KANN  Date of Exam:   06/30/2021 Medical Rec #: [id number]     Accession #:    [id number] Date of Birth: Aug 06, 1930    Patient Gender: M Patient Age:   47 years Exam Location:  [facility] Procedure:      VAS [place] UPPER EXTREMITY VENOUS DUPLEX Referring Phys: Midge Minium --------------------------------------------------------------------------------  Indications: Right arm pain, warmth, and swelling Limitations: Involuntary patient movement, inability to reposition, pain tolerance. Comparison Study: No prior studies. Performing Technologist: Jean Rosenthal RDMS, RVT  Examination Guidelines: A complete evaluation includes B-mode imaging, spectral Doppler, color Doppler, and power Doppler as needed of all accessible portions of each vessel. Bilateral testing is considered an integral part  of a complete examination. Limited examinations for reoccurring indications may be performed as noted.  Right Findings: +----------+------------+---------+-----------+----------+---------------------+ RIGHT     CompressiblePhasicitySpontaneousProperties       Summary        +----------+------------+---------+-----------+----------+---------------------+ IJV           Full       Yes       Yes                  Rouleaux flow     +----------+------------+---------+-----------+----------+---------------------+ Subclavian               Yes       Yes                  Rouleaux flow     +----------+------------+---------+-----------+----------+---------------------+ Axillary      Full       Yes       Yes                  Rouleaux flow     +----------+------------+---------+-----------+----------+---------------------+ Brachial      Full                                                         +----------+------------+---------+-----------+----------+---------------------+ Radial        Full                                                        +----------+------------+---------+-----------+----------+---------------------+ Ulnar         Full                                   Not well visualized                                                      at forearm secondary                                                           to patient                                                           movement/inability to                                                          reposition       +----------+------------+---------+-----------+----------+---------------------+ Cephalic  Full                                   Not well visualized                                                      at forearm secondary                                                           to patient                                                           movement/inability to                                                          reposition       +----------+------------+---------+-----------+----------+---------------------+ Basilic       Full                                                        +----------+------------+---------+-----------+----------+---------------------+  Summary:  Right: No evidence of deep vein thrombosis in the upper extremity. No evidence of superficial vein thrombosis in the upper extremity.  *See table(s) above for measurements and observations.    Preliminary     Scheduled Meds:  enoxaparin (LOVENOX) injection  40 mg Subcutaneous Q24H    Continuous Infusions:  cefTRIAXone (ROCEPHIN)  IV Stopped (06/30/21 1118)   lactated ringers 75 mL/hr at 06/30/21 1118     LOS: 0 days     Darlin Drop, MD [facility] Pager [phone]  If 7PM-7AM, please contact  night-coverage [email or web address] Password [facility] 06/30/2021, 2:47 PM

## 2021-06-30 NOTE — ED Notes (Signed)
Placed Breakfast orders 

## 2021-06-30 NOTE — Consult Note (Signed)
Consultation Note Date: 06/30/2021   Patient Name: Charles Morgan  DOB: 09/26/1930  MRN: [id number]  Age / Sex: 87 y.o., male  PCP: System, Provider Not In Referring Physician: Kayleen Memos, DO  Reason for Consultation: Establishing goals of care  HPI/Patient Profile: 87 y.o. male  with past medical history of dementia dependent for all ADL's, CVA, CKD-3, PAD, thrombocytopenia, chronic pain, and failure to thrive presented to [facility] ED on 06/28/21 for increased weakness and decreased oral intake - patient was sent to ED by PCP. Patient was admitted on 06/28/2021 with UTI, left foot wound, elevated AST, advanced dementia.   Clinical Assessment and Goals of Care: I have reviewed medical records including EPIC notes, labs, and imaging. Received report from primary RN - no acute concerns.   Went to visit patient at bedside - no family/visitors present. Patient was lying in bed awake, oriented to self only, and unable to participate in complex medical decision making. No signs or non-verbal gestures of pain or discomfort noted. No respiratory distress, increased work of breathing, or secretions noted.   Met with granddaughter/Charles to discuss diagnosis, prognosis, GOC, EOL wishes, disposition, and options.  I introduced Palliative Medicine as specialized medical care for people living with serious illness. It focuses on providing relief from the symptoms and stress of a serious illness. The goal is to improve quality of life for both the patient and the family.  We discussed a brief life review of the patient as well as functional and nutritional status.  Patient's wife passed -they had 6 children, with 5 surviving. Charles Morgan tells me that patient is estranged from all of his children except her mother.  While patient was in [place] he lives with his daughter.  During this time in [place] he was on hospice services due to  undiagnosed syncopal episodes; however hospice was revoked as patient did not decline and life expectancy increased beyond 6 months.  Patient then moved to [place] -recently after he had to go to rehab after a fall; however, expressed he did not want to participate in therapy.  Patient started living with granddaughter/Charles as of June 2022. Charles Morgan explains that at this time patient was able to walk assisted with a walker and could feed himself; however, he was not able to perform ADLs independently. Charles noticed a drastic decline in patient's mental and physical status as of this summer.  Currently, patient is bedbound, not able to feed himself, poor oral intake (she reports he spits out his food), and has become increasingly confused and forgetful.  We reviewed specific indicators of end stage dementia, including inability to communicate, bed bound/non-ambulatory status, decreased oral intake, swallowing dysfunction, and incontinence of bowel/bladder. Patient's history is significant for gradually worsening progressive symptoms of dementia.  We discussed patient's current illness and what it means in the larger context of patient's on-going co-morbidities.  Charles Morgan understands that dementia is a progressive, non-curable disease underlying the patient's current acute medical conditions. Natural disease trajectory and expectations at EOL were discussed. I  attempted to elicit values and goals of care important to the patient. The difference between aggressive medical intervention and comfort care was considered in light of the patient's goals of care. Charles Morgan states they have been trying to get patient into hospice care as she does feel like he would qualify at this time. Verified that patient would likely qualify for home hospice services. Charles Morgan is not certain of what home hospice entails and requests further information. Provided education and counseling at length on the philosophy and benefits of hospice  care. Discussed that it offers a holistic approach to care in the setting of end-stage illness, and is about supporting the patient where they are allowing nature to take it's course. Discussed the hospice team includes RNs, physicians, social workers, and chaplains. They can provide personal care, support for the family, and help keep patient out of the hospital as well as assist with DME needs for home hospice. Education provided on the difference between home vs residential hospice. Charles Morgan is agreeable to home hospice services on discharge -requests [facility].  We talked about transition to comfort measures in house and what that would entail inclusive of medications to control pain, dyspnea, agitation, nausea, and itching. We discussed stopping all unnecessary measures such as blood draws, needle sticks, oxygen, antibiotics, CBGs/insulin, cardiac monitoring, IVF, and frequent vital signs. Charles Morgan is agreeable to transition patient to full comfort care in house today.   We discussed discharge [place] would like patient to return home with hospice after DME is delivered.  Advance directives, concepts specific to code status, artificial feeding and hydration, and rehospitalization were considered and discussed.  Patient does not have [place] POA.  Patient's daughter/Charles's mother has allowed Charles Morgan to make all of patient's medical decisions since he currently resides with her. Charles Morgan is clear that the goal is for patient not to be rehospitalized after discharge and she does not want to pursue aggressive life prolonging measures.  Discussed with patient/family the importance of continued conversation with each other and the medical providers regarding overall plan of care and treatment options, ensuring decisions are within the context of the patient's values and GOCs.    Questions and concerns were addressed. The patient/family was encouraged to call with questions and/or concerns. PMT number was  provided.   Primary Decision Maker NEXT OF KIN - granddaughter/Charles Morgan    SUMMARY OF RECOMMENDATIONS   Initiated full comfort measures Continue DNR/DNI as previously documented - durable DNR form completed and placed in shadow chart. Copy was made and will be scanned into Vynca/ACP tab Granddaughter will be ready for patient to discharge home with hospice once DME is delivered [facility] consult placed for: Home hospice referral -family requesting [facility].  [place] liaison and TOC were notified. Added orders for EOL symptom management and to reflect full comfort measures, as well as discontinued orders that were not focused on comfort Continue palliative wound care Unrestricted visitation orders were placed per current [place] EOL visitation policy  Nursing to provide frequent assessments and administer PRN medications as clinically necessary to ensure EOL comfort PMT will continue to follow and support holistically   Code Status/Advance Care Planning: DNR  Palliative Prophylaxis:  Aspiration, Bowel Regimen, Delirium Protocol, Frequent Pain Assessment, Oral Care, Palliative Wound Care, and Turn Reposition  Additional Recommendations (Limitations, Scope, Preferences): Full Comfort Care  Psycho-social/Spiritual:  Desire for further Chaplaincy support:no Created space and opportunity for patient and family to express thoughts and feelings regarding patient's current medical situation.  Emotional support and therapeutic listening provided.  Prognosis:  < 6 months  Discharge Planning: Home with Hospice      Primary Diagnoses: Present on Admission:  UTI (urinary tract infection)   I have reviewed the medical record, interviewed the patient and family, and examined the patient. The following aspects are pertinent.  Past Medical History:  Diagnosis Date   Aortic stenosis    Arthritis    Bradycardia, sinus 2021   Nocturnal bradycardia, not clearly symptomatic or  associated with syncope   CVA (cerebral vascular accident) ([place])    Two events: 2013, 2019   DVT (deep venous thrombosis) ([place]) Aug '20   right LE   HLD (hyperlipidemia)    NSVT (nonsustained ventricular tachycardia)    LVEF normal, not associated with syncope   Proctocolitis without complication Aug '21   CT diagnosis   Syncope and collapse    Multiple recurrences - vertebrobasilar insufficiency, autonomic dysfunction, ILR placed by cardiologist in [place]   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 Neurology eval - no need for further w/u   Vertebrobasilar artery insufficiency    Social History   Socioeconomic History   Marital status: Legally Separated    Spouse name: Not on file   Number of children: Not on file   Years of education: Not on file   Highest education level: Not on file  Occupational History   Not on file  Tobacco Use   Smoking status: Never   Smokeless tobacco: Never  Vaping Use   Vaping Use: Never used  Substance and Sexual Activity   Alcohol use: Never   Drug use: Never   Sexual activity: Not on file  Other Topics Concern   Not on file  Social History Narrative   Not on file   Social Determinants of Health   Financial Resource Strain: Not on file  Food Insecurity: Not on file  Transportation Needs: Not on file  Physical Activity: Not on file  Stress: Not on file  Social Connections: Not on file   Family History  Family history unknown: Yes   Scheduled Meds:  atorvastatin  80 mg Oral Daily   enoxaparin (LOVENOX) injection  40 mg Subcutaneous Q24H   Continuous Infusions:  [START ON 07/01/2021] cefTRIAXone (ROCEPHIN)  IV     lactated ringers 75 mL/hr at 06/30/21 1118   PRN Meds:.acetaminophen **OR** acetaminophen, bisacodyl, HYDROmorphone (DILAUDID) injection, methocarbamol, ondansetron **OR** ondansetron (ZOFRAN) IV, oxyCODONE, polyethylene glycol, sodium phosphate Medications Prior to Admission:  Prior to  Admission medications   Medication Sig Start Date End Date Taking? Authorizing Provider  atorvastatin (LIPITOR) 80 MG tablet Take 1 tablet (80 mg total) by mouth daily. 06/06/20  Yes Dahal, Marlowe Aschoff, MD  Brinzolamide-Brimonidine 1-0.2 % SUSP Place 1 drop into both eyes in the morning and at bedtime.  12/12/19  Yes [provider]  clopidogrel (PLAVIX) 75 MG tablet Take 1 tablet (75 mg total) by mouth daily. Patient not taking: No sig reported 11/02/20 11/02/21  Waynetta Sandy, MD   Allergies  Allergen Reactions   Tuberculin Rash   Review of Systems  Unable to perform ROS: Dementia   Physical Exam Vitals and nursing note reviewed.  Constitutional:      General: He is not in acute distress.    Appearance: He is cachectic. He is ill-appearing.  Pulmonary:     Effort: No respiratory distress.  Skin:    General: Skin is warm and dry.  Findings: Wound present.  Neurological:     Mental Status: He is lethargic, disoriented and confused.     Motor: Weakness present.  Psychiatric:        Speech: Speech is slurred.        Cognition and Memory: Cognition is impaired. Memory is impaired.    Vital Signs: BP 119/84   Pulse 91   Temp 99.5 F (37.5 C) (Rectal)   Resp 18   Wt 59 kg   SpO2 93%   BMI 20.37 kg/m  Pain Scale: 0-10   Pain Score: 0-No pain   SpO2: SpO2: 93 % O2 Device:SpO2: 93 % O2 Flow Rate: .   IO: Intake/output summary:  Intake/Output Summary (Last 24 hours) at 06/30/2021 1546 Last data filed at 06/30/2021 1118 Gross per 24 hour  Intake 100 ml  Output --  Net 100 ml    LBM:   Baseline Weight: Weight: 59 kg Most recent weight: Weight: 59 kg     Palliative Assessment/Data: PPS 20%     Time In: 1550 Time Out: 1702 Time Total: 72 minutes  Greater than 50%  of this time was spent counseling and coordinating care related to the above assessment and plan.  Signed by: Lin Landsman, NP   Please contact Palliative Medicine Team phone at  [phone] for questions and concerns.  For individual provider: See Shea Evans

## 2021-06-30 NOTE — TOC Initial Note (Signed)
Transition of Care [facility]) - Initial/Assessment Note    Patient Details  Name: Charles Morgan MRN: [id number] Date of Birth: Jul 09, 1930  Transition of Care [facility]) CM/SW Contact:    Kingsley Plan, RN Phone Number: 06/30/2021, 4:53 PM  Clinical Narrative:                 Received a secure chat from Amber with palliative , that family want to take [facility] home with hospice through [facility] and contact person is grand daughter Reva Bores [phone].   NCM called Denia and discussed home with hospice.   NCM offered choice Denia does want [facility].   Patient has a bedside commode and walker at home. Patient unable to use walker, family requesting hospital bed and wheel chair. Patient will need transportation home. Confirmed face sheet address [address].   NCM explained to [place] , NCM will make a referral to [facility], [facility] will be in touch with her and order DME.   Once DME is in the home , NCM will arrange ambulance transportation home. Denia voiced understanding.  Called Chrislyn with [facility]  Expected Discharge Plan: Home w Hospice Care     Patient Goals and CMS Choice        Expected Discharge Plan and Services Expected Discharge Plan: Home w Hospice Care                                              Prior Living Arrangements/Services                  Current home services: DME    Activities of Daily Living      Permission Sought/Granted                  Emotional Assessment              Admission diagnosis:  UTI (urinary tract infection) [N39.0] Patient Active Problem List   Diagnosis Date Noted   UTI (urinary tract infection) 06/29/2021   Corns and callus 11/27/2020   Primary open angle glaucoma 11/27/2020   Pain in wrist 11/27/2020   Onychomycosis 11/27/2020   Old myocardial infarction 11/27/2020   Occupational maladjustment 11/27/2020   Nonspecific reaction to tuberculin skin test 11/27/2020    Neuralgia 11/27/2020   Microscopic hematuria 11/27/2020   Metabolic encephalopathy 11/27/2020   Lung field abnormal 11/27/2020   Localized, primary osteoarthritis of hand 11/27/2020   Intracranial hemorrhage (HCC) 11/27/2020   Inguinal hernia 11/27/2020   Impacted cerumen 11/27/2020   Housing lack 11/27/2020   History of thrombocytopenia 11/27/2020   History of fall 11/27/2020   Groin mass 11/27/2020   Esophageal reflux 11/27/2020   Edema 11/27/2020   Dysphagia 11/27/2020   Cocaine abuse, episodic use (HCC) 11/27/2020   Constipation 11/27/2020   Disorder of oral mucous membrane 11/27/2020   Duodenal ulcer without hemorrhage, perforation, or obstruction 11/27/2020   Benign prostatic hyperplasia 11/27/2020   Atopic dermatitis 11/27/2020   Allergic rhinitis 11/27/2020   Alcohol abuse 11/27/2020   Acute pharyngitis 11/27/2020   Bilateral hand pain 11/27/2020   Mild cognitive impairment 11/27/2020   Neuropraxia of right upper extremity, initial encounter 09/04/2020   Lactic acidosis 09/01/2020   Hypoalbuminemia 09/01/2020   Hyperammonemia (HCC) 09/01/2020   Altered mental status 08/31/2020   CKD (chronic kidney disease) stage 3, GFR 30-59 ml/min (HCC) 06/05/2020  Basilar artery stenosis 06/05/2020   Transaminasemia 06/05/2020   Thrombocytopenia (HCC) 06/05/2020   Syncope 06/04/2020   Dehydration 06/04/2020   AKI (acute kidney injury) (HCC) 06/04/2020   HLD (hyperlipidemia) 06/04/2020   Calcification of aortic valve 06/04/2020   Syncope and collapse 06/04/2020   Ventricular tachycardia 02/18/2020   Adrenal insufficiency (Addison's disease) (HCC) 01/26/2020   Ambulatory dysfunction 01/26/2020   Severe muscle deconditioning 01/12/2020   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   Elevated AST (SGOT) 01/11/2020   History of stroke 01/10/2020   Epigastric pain 12/06/2018   Stroke (HCC) 08/31/2017   Near syncope 11/04/2015   PCP:  System, Provider Not In Pharmacy:   [facility] - [place] ([place] - [address] [place] ([place]) [place] [address] Phone: [phone] Fax: [phone]     Social Determinants of Health (SDOH) Interventions    Readmission Risk Interventions No flowsheet data found.

## 2021-06-30 NOTE — Progress Notes (Signed)
Upper extremity venous right study completed.   Please see CV Proc for preliminary results.   Siah Steely, RDMS, RVT  

## 2021-06-30 NOTE — Progress Notes (Signed)
Civil engineer, contracting [facility]) Hospital Liaison: RN note     Notified by Transition of Care Manger of patient/family request for [facility] services at home after discharge. Chart and patient information under review by [facility] physician. Hospice eligibility pending currently.     Writer called pt's granddaughter Audree Camel to initiate education related to hospice philosophy, services and team approach to care but was unable to reach her at this time.  Voicemail left. [facility] liaison will follow up tomorrow.             Please do not hesitate to call with questions.   Thank you for the opportunity to participate in this patient's care.  Gillian Scarce, BSN, RN       [facility] Liaison (listed on AM[facility] under Hospice /[phone]  [phone] (24h on call)

## 2021-06-30 NOTE — Progress Notes (Signed)
Pt arrived to [facility] ARB room 8. Alert. Bed in lowest position. Call light in reach.

## 2021-06-30 NOTE — ED Notes (Signed)
Pt brief changed.  

## 2021-06-30 NOTE — Consult Note (Signed)
WOC Nurse Consult Note: Patient receiving care in [facility] ED H11. Reason for Consult: left foot wound Wound type: necrotic left first metatarsal head Pressure Injury POA: Yes Measurement: 2.5 cm x 3 cm x unknown depth. Wound bed is 100% yellow, non-viable tissue surrounded by a darkened ring of skin along the wound perimeter. If all non-viable tissue were to be removed, it is highly likely this wound extend to bone. Wound bed: Drainage (amount, consistency, odor) no drainage expressed with probing with cotton tipped applicator or gentle pressure to surrounding tissue. Periwound: Dressing procedure/placement/frequency: Apply iodine from the swabsticks or swab pads from clean utility to left great toe wound.  Allow to dry. Place a small piece of Aquacel [place] ([phone]) over the wound, secure with a few turns of Kerlix.  I see a Hospice consult has been placed. I have implemented conservative treatment aimed at microbial reduction and avoiding the use of moisture to an already malodorous and necrotic wound.    I have added the use of Prevalon heel lift boots.  WOC nurse will not follow at this time.  Please re-consult the WOC team if needed.  Helmut Muster, RN, MSN, CWOCN, CNS-BC, pager [phone]

## 2021-07-01 DIAGNOSIS — N3000 Acute cystitis without hematuria: Secondary | ICD-10-CM | POA: Diagnosis not present

## 2021-07-01 MED ORDER — POLYETHYLENE GLYCOL 3350 17 G PO PACK: 17.0000 g | PACK | Freq: Every day | ORAL | 0 refills | Status: AC | PRN

## 2021-07-01 MED ORDER — BIOTENE DRY MOUTH MT LIQD: 15.0000 mL | Freq: Two times a day (BID) | OROMUCOSAL | 0 refills | Status: AC

## 2021-07-01 MED ORDER — LORAZEPAM 2 MG/ML PO CONC: 1.0000 mg | ORAL | 0 refills | Status: AC | PRN

## 2021-07-01 MED ORDER — ONDANSETRON 4 MG PO TBDP: 4.0000 mg | ORAL_TABLET | Freq: Four times a day (QID) | ORAL | 0 refills | Status: AC | PRN

## 2021-07-01 MED ORDER — HALOPERIDOL LACTATE 2 MG/ML PO CONC: 2.0000 mg | Freq: Three times a day (TID) | ORAL | 0 refills | Status: AC | PRN

## 2021-07-01 MED ORDER — MORPHINE SULFATE (CONCENTRATE) 10 MG/0.5ML PO SOLN: 5.0000 mg | ORAL | 0 refills | Status: AC | PRN

## 2021-07-01 NOTE — Discharge Summary (Signed)
Discharge Summary  Charles Morgan UVO:[id number] DOB: [birth year]  PCP: System, Provider Not In  Admit date: 06/28/2021 Discharge date: 07/01/2021  Time spent: 35 minutes  Recommendations for Outpatient Follow-up:  Continue on hospice care.  Discharge Diagnoses:  Active Hospital Problems   Diagnosis Date Noted   UTI (urinary tract infection) 06/29/2021    Resolved Hospital Problems  No resolved problems to display.    Discharge Condition: Stable  Diet recommendation: Resume previous diet, pleasure feedings.  Vitals:   07/01/21 0437 07/01/21 0856  BP: 137/83 (!) 137/92  Pulse: 90 92  Resp: (!) 21 18  Temp: 98 F (36.7 C) (!) 97.5 F (36.4 C)  SpO2: (!) 89% 90%    History of present illness:  Charles Morgan is a 87 y.o. male with history of severe dementia dependent for all ADL's, CVA, CKD-3, PAD, thrombocytopenia, chronic pain and failure to thrive presenting with generalized weakness and decreased oral intake.  Work-up revealed staph aureus urinary tract infection, pressure wounds, POA.  Seen by palliative care team, family made decision for comfort care only.  Plan is to DC home with home hospice care.  07/01/21: Patient was seen and examined at his bedside.  He is alert, calm and pleasantly demented.  Hospital Course:  Active Problems:   UTI (urinary tract infection)  Staph aureus UTI, POA/left foot wound with necrotic left first metatarsal head, POA UA positive for pyuria Urine culture growing greater than 100,000 colonies of staph aureus Sensitivities pending Family made decision for comfort care only.  Was switched to comfort care measures only on 06/30/2021 by palliative care team and all means of treatment were discontinued.  Comfort care measures Continue p.o. morphine as needed for pain Continue p.o. Ativan as needed for anxiety Hospice care will follow  Procedures: None  Consultations: Palliative care team  Discharge Exam: BP (!) 137/92 (BP Location:  Right Arm)   Pulse 92   Temp (!) 97.5 F (36.4 C) (Oral)   Resp 18   Wt 59 kg   SpO2 90%   BMI 20.37 kg/m  General: 87 y.o. year-old male very frail-appearing.  Alert and pleasant.   Cardiovascular: Regular rate and rhythm with no rubs or gallops.  No thyromegaly or JVD noted.   Respiratory: Clear to auscultation with no wheezes or rales.  Poor inspiratory effort. Abdomen: Soft nontender nondistended with normal bowel sounds x4 quadrants. Musculoskeletal: Trace lower extremity edema bilaterally. Skin: Pressure wounds noted. Psychiatry: Mood is appropriate for condition and setting  Discharge Instructions You were cared for by a hospitalist during your hospital stay. If you have any questions about your discharge medications or the care you received while you were in the hospital after you are discharged, you can call the unit and asked to speak with the hospitalist on call if the hospitalist that took care of you is not available. Once you are discharged, your primary care physician will handle any further medical issues. Please note that NO REFILLS for any discharge medications will be authorized once you are discharged, as it is imperative that you return to your primary care physician (or establish a relationship with a primary care physician if you do not have one) for your aftercare needs so that they can reassess your need for medications and monitor your lab values.   Allergies as of 07/01/2021       Reactions   Tuberculin Rash        Medication List     STOP taking these medications  atorvastatin 80 MG tablet Commonly known as: LIPITOR   clopidogrel 75 MG tablet Commonly known as: Plavix       TAKE these medications    antiseptic oral rinse Liqd Apply 15 mLs topically 2 (two) times daily.   Brinzolamide-Brimonidine 1-0.2 % Susp Place 1 drop into both eyes in the morning and at bedtime.   haloperidol 2 MG/ML solution Commonly known as: HALDOL Place 1 mL (2  mg total) under the tongue every 8 (eight) hours as needed for up to 5 days for agitation (or delirium).   LORazepam 2 MG/ML concentrated solution Commonly known as: ATIVAN Place 0.5 mLs (1 mg total) under the tongue every hour as needed for up to 5 days for anxiety, seizure or sedation (distress, increased work of breathing).   morphine CONCENTRATE 10 MG/0.5ML Soln concentrated solution Take 0.25-0.5 mLs (5-10 mg total) by mouth every 2 (two) hours as needed for up to 5 days for severe pain, anxiety, shortness of breath or moderate pain (distress, respiratory rate >25).   ondansetron 4 MG disintegrating tablet Commonly known as: ZOFRAN-ODT Take 1 tablet (4 mg total) by mouth every 6 (six) hours as needed for nausea.   polyethylene glycol 17 g packet Commonly known as: MIRALAX / GLYCOLAX Take 17 g by mouth daily as needed for mild constipation.       Allergies  Allergen Reactions   Tuberculin Rash      The results of significant diagnostics from this hospitalization (including imaging, microbiology, ancillary and laboratory) are listed below for reference.    Significant Diagnostic Studies: DG Chest 1 View  Result Date: 06/28/2021 CLINICAL DATA:  Weakness for 1 day. EXAM: CHEST  1 VIEW COMPARISON:  04/23/2021 and older exams FINDINGS: Cardiac silhouette is normal in size. No mediastinal or hilar masses. Stable left chest wall loop recorder. Lungs are clear.  No pleural effusion or pneumothorax. Skeletal structures are grossly intact. IMPRESSION: No acute cardiopulmonary disease. Electronically Signed   By: Amie Portland M.D.   On: 06/28/2021 16:19   DG Foot 2 Views Left  Result Date: 06/29/2021 CLINICAL DATA:  Left foot wound. EXAM: LEFT FOOT - 2 VIEW COMPARISON:  November 05, 2020. FINDINGS: Status post surgical amputation of the second toe. Moderate hallux valgus deformity is seen involving the first metatarsophalangeal joint with moderate degenerative joint disease in this  joint. Overlying soft tissue ulceration is seen medial to the distal first metatarsal. No definite lytic destruction is seen to suggest osteomyelitis. No definite fracture is noted. IMPRESSION: Moderate hallux valgus deformity is seen involving first metatarsophalangeal joint with moderate degenerative joint disease of this joint as well. Soft tissue ulceration is seen overlying the distal portion of the first metatarsal without definite evidence of osteomyelitis. Electronically Signed   By: Lupita Raider M.D.   On: 06/29/2021 15:50   VAS [place] UPPER EXTREMITY VENOUS DUPLEX  Result Date: 06/30/2021 UPPER VENOUS STUDY  Patient Name:  Charles Morgan  Date of Exam:   06/30/2021 Medical Rec #: [id number]     Accession #:    [id number] Date of Birth: 1929-10-05    Patient Gender: M Patient Age:   87 years Exam Location:  [facility] Procedure:      VAS [place] UPPER EXTREMITY VENOUS DUPLEX Referring Phys: Midge Minium --------------------------------------------------------------------------------  Indications: Right arm pain, warmth, and swelling Limitations: Involuntary patient movement, inability to reposition, pain tolerance. Comparison Study: No prior studies. Performing Technologist: Jean Rosenthal RDMS, RVT  Examination Guidelines: A complete evaluation  includes B-mode imaging, spectral Doppler, color Doppler, and power Doppler as needed of all accessible portions of each vessel. Bilateral testing is considered an integral part of a complete examination. Limited examinations for reoccurring indications may be performed as noted.  Right Findings: +----------+------------+---------+-----------+----------+---------------------+ RIGHT     CompressiblePhasicitySpontaneousProperties       Summary        +----------+------------+---------+-----------+----------+---------------------+ IJV           Full       Yes       Yes                  Rouleaux flow      +----------+------------+---------+-----------+----------+---------------------+ Subclavian               Yes       Yes                  Rouleaux flow     +----------+------------+---------+-----------+----------+---------------------+ Axillary      Full       Yes       Yes                  Rouleaux flow     +----------+------------+---------+-----------+----------+---------------------+ Brachial      Full                                                        +----------+------------+---------+-----------+----------+---------------------+ Radial        Full                                                        +----------+------------+---------+-----------+----------+---------------------+ Ulnar         Full                                   Not well visualized                                                      at forearm secondary                                                           to patient                                                           movement/inability to  reposition       +----------+------------+---------+-----------+----------+---------------------+ Cephalic      Full                                   Not well visualized                                                      at forearm secondary                                                           to patient                                                           movement/inability to                                                          reposition       +----------+------------+---------+-----------+----------+---------------------+ Basilic       Full                                                        +----------+------------+---------+-----------+----------+---------------------+  Summary:  Right: No evidence of deep vein thrombosis in the upper extremity. No  evidence of superficial vein thrombosis in the upper extremity.  *See table(s) above for measurements and observations.  Diagnosing physician: Coral Else MD Electronically signed by Coral Else MD on 06/30/2021 at 4:51:26 PM.    Final     Microbiology: Recent Results (from the past 240 hour(s))  Urine Culture     Status: Abnormal (Preliminary result)   Collection Time: 06/29/21  9:26 AM   Specimen: Urine, Clean Catch  Result Value Ref Range Status   Specimen Description URINE, CLEAN CATCH  Final   Special Requests NONE  Final   Culture (A)  Final    >=100,000 COLONIES/mL STAPHYLOCOCCUS AUREUS SUSCEPTIBILITIES TO FOLLOW Performed at [facility], [address] [place], [place] [address]    Report Status PENDING  Incomplete  Resp Panel by RT-PCR (Flu A&B, Covid) Nasopharyngeal Swab     Status: None   Collection Time: 06/29/21 10:49 AM   Specimen: Nasopharyngeal Swab; Nasopharyngeal(NP) swabs in vial transport medium  Result Value Ref Range Status   SARS Coronavirus 2 by RT PCR NEGATIVE NEGATIVE Final    Comment: (NOTE) SARS-CoV-2 target nucleic acids are NOT DETECTED.  The SARS-CoV-2 RNA is generally detectable in upper respiratory specimens during the acute phase of infection. The lowest concentration of SARS-CoV-2 viral copies this assay can detect is 138  copies/mL. A negative result does not preclude SARS-Cov-2 infection and should not be used as the sole basis for treatment or other patient management decisions. A negative result may occur with  improper specimen collection/handling, submission of specimen other than nasopharyngeal swab, presence of viral mutation(s) within the areas targeted by this assay, and inadequate number of viral copies(<138 copies/mL). A negative result must be combined with clinical observations, patient history, and epidemiological information. The expected result is Negative.  Fact Sheet for Patients:   BloggerCourse.com  Fact Sheet for Healthcare Providers:  SeriousBroker.it  This test is no t yet approved or cleared by the [place] FDA and  has been authorized for detection and/or diagnosis of SARS-CoV-2 by FDA under an Emergency Use Authorization (EUA). This EUA will remain  in effect (meaning this test can be used) for the duration of the COVID-19 declaration under Section 564(b)(1) of the Act, 21 U.S.C.section 360bbb-3(b)(1), unless the authorization is terminated  or revoked sooner.       Influenza A by PCR NEGATIVE NEGATIVE Final   Influenza B by PCR NEGATIVE NEGATIVE Final    Comment: (NOTE) The Xpert Xpress SARS-CoV-2/FLU/RSV plus assay is intended as an aid in the diagnosis of influenza from Nasopharyngeal swab specimens and should not be used as a sole basis for treatment. Nasal washings and aspirates are unacceptable for Xpert Xpress SARS-CoV-2/FLU/RSV testing.  Fact Sheet for Patients: BloggerCourse.com  Fact Sheet for Healthcare Providers: SeriousBroker.it  This test is not yet approved or cleared by the [place] FDA and has been authorized for detection and/or diagnosis of SARS-CoV-2 by FDA under an Emergency Use Authorization (EUA). This EUA will remain in effect (meaning this test can be used) for the duration of the COVID-19 declaration under Section 564(b)(1) of the Act, 21 U.S.C. section 360bbb-3(b)(1), unless the authorization is terminated or revoked.  Performed at [facility], [address] [place], [place] [address]      Labs: Basic Metabolic Panel: Recent Labs  Lab 06/28/21 2340 06/30/21 0541  NA 143 147*  K 3.9 3.7  CL 110 113*  CO2 25 23  GLUCOSE 130* 101*  BUN 35* 28*  CREATININE 0.98 0.82  CALCIUM 9.4 9.0  MG  --  2.2  PHOS  --  3.1   Liver Function Tests: Recent Labs  Lab 06/28/21 2340 06/30/21 0541  AST  70* 69*  ALT 35 35  ALKPHOS 69 75  BILITOT 0.9 0.9  PROT 6.6 6.0*  ALBUMIN 2.1* 1.9*   No results for input(s): LIPASE, AMYLASE in the last 168 hours. No results for input(s): AMMONIA in the last 168 hours. CBC: Recent Labs  Lab 06/28/21 2340 06/30/21 0541  WBC 17.6* 17.8*  NEUTROABS 15.8*  --   HGB 12.7* 12.5*  HCT 39.6 38.8*  MCV 92.1 92.6  PLT 107* 111*   Cardiac Enzymes: Recent Labs  Lab 06/30/21 0541  CKTOTAL 825*   BNP: BNP (last 3 results) No results for input(s): BNP in the last 8760 hours.  ProBNP (last 3 results) No results for input(s): PROBNP in the last 8760 hours.  CBG: Recent Labs  Lab 06/29/21 1200  GLUCAP 100*       Signed:  Darlin Drop, MD [facility] 07/01/2021, 4:51 PM

## 2021-07-01 NOTE — Care Management (Addendum)
Spoke to patient's grand daughter Reva Bores via phone. Confirmed DME is in place and she is ready for Edrees to come home this evening.  Explained NCM will arrange PTAR but unsure when they will arrive at hospital to pick him up. Denia asking if his nurse could call her when PTAR arrives at hospital . NCM secured chatted nurse message.   PTAR called, unable to provide estimated arrival time.   PTAR paperwork along with DNR form on chart.  Chrislyn with [facility] aware.

## 2021-07-01 NOTE — Progress Notes (Signed)
Civil engineer, contracting [facility]) Hospital Liaison: RN note      Writer spoke with Audree Camel to initiate education related to hospice philosophy, services and team approach to care. Denia  verbalized understanding of information given. Per discussion, plan is for discharge home by PTAR once DME is delivered.  Please send signed and completed DNR form home with patient/family.   Please provide prescriptions for mediations at discharge to ensure comfort until patient can be admitted onto hospice services.   DME needs have been discussed.  Patient currently has the following equipment in the home: [facility] and walker.  Patient/family requests the following DME for delivery to the home:  bed and a transport   [facility] equipment manager has been notified and will contact DME provider to arrange delivery to the home. Home address has been verified and is correct in the chart.     Audree Camel is the family member to contact to arrange time of delivery.      [facility] aware of the above. Please notify [facility] when patient is ready to leave the unit at discharge. (Call [phone] or [phone] after 5pm.)  [facility] information and contact numbers given to  [facility].       Please do not hesitate to call with questions.   Thank you for the opportunity to participate in this patient's care.  Gillian Scarce, BSN, RN       [facility] Liaison (listed on AMION under Hospice /[phone]  [phone] (24h on call)

## 2021-07-01 NOTE — Progress Notes (Signed)
Charles Morgan to be D/C'd  per MD order.  Discussed with the patient and all questions fully answered.  VSS, Skin clean, dry and intact without evidence of skin break down, no evidence of skin tears noted.  IV catheter discontinued intact. Site without signs and symptoms of complications. Dressing and pressure applied.  An After Visit Summary was printed and given to the PTAR to give to granddaughter- [name].  PTAR to transport patient home.

## 2021-07-07 LAB — SUSCEPTIBILITY RESULT

## 2021-07-07 LAB — SUSCEPTIBILITY, AER + ANAEROB

## 2021-07-08 LAB — URINE CULTURE: Culture: >=100000 — AB

## 2021-07-27 DEATH — deceased

## 2022-07-06 IMAGING — CT CT HEAD CODE STROKE
3 series · 14 of 47 positions shown, 16 images · IV contrast (omnipaque)
Comparison: CT head 08/31/2020

CLINICAL DATA: Code stroke.  Right arm drift, numbness and tingling

EXAM:
CT HEAD WITHOUT CONTRAST
CT ANGIOGRAPHY OF THE HEAD AND NECK
TECHNIQUE: Contiguous axial images were obtained from the base of the skull
through the vertex without intravenous contrast. Multidetector CT
imaging of the head and neck was performed using the standard
protocol during bolus administration of intravenous contrast.
Multiplanar CT image reconstructions and MIPs were obtained to
evaluate the vascular anatomy. Carotid stenosis measurements (when
applicable) are obtained utilizing NASCET criteria, using the distal
internal carotid diameter as the denominator.
CONTRAST:  75mL OMNIPAQUE IOHEXOL 350 MG/ML SOLN

[Series 2: head w o · axial · 0.38mm/px · z∈[+40,+176]mm · 8 of 33 slices shown, 10 images]
[im 3/33  brain]
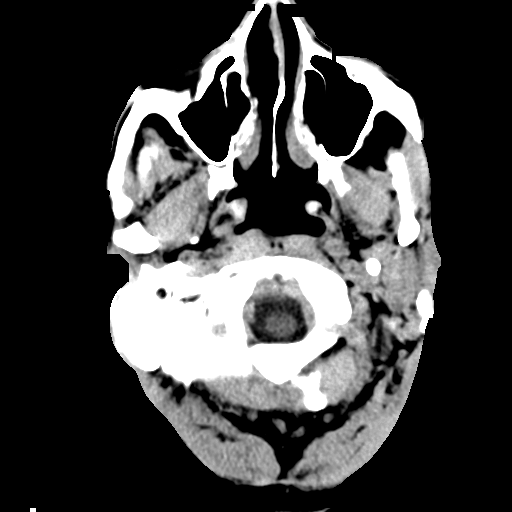
[im 3/33  bone]
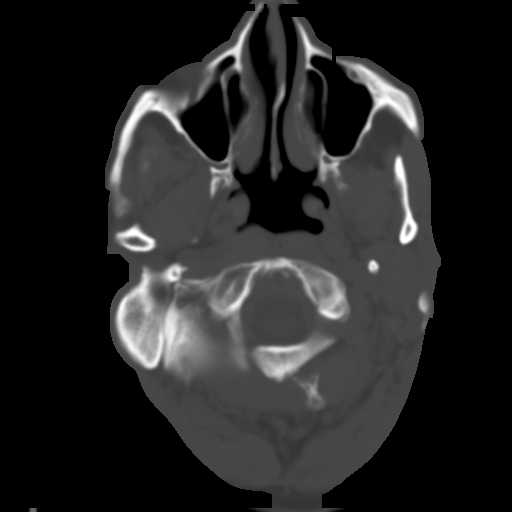
[im 7/33  brain]
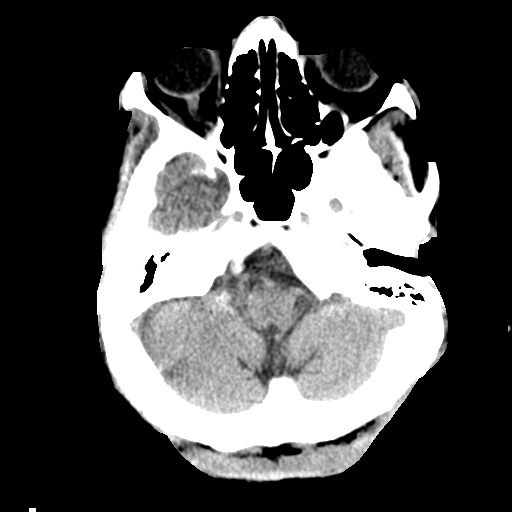
[im 10/33  brain]
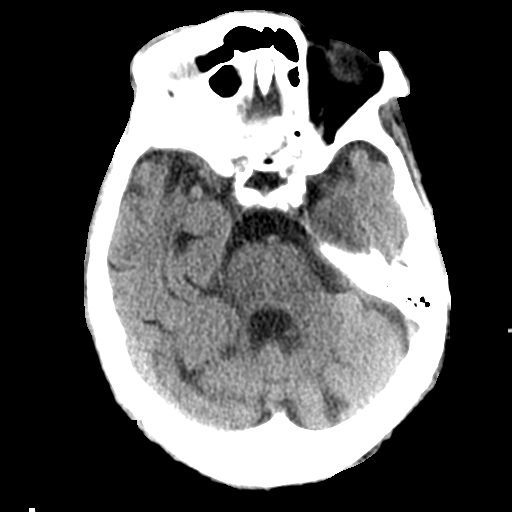
[im 15/33  brain]
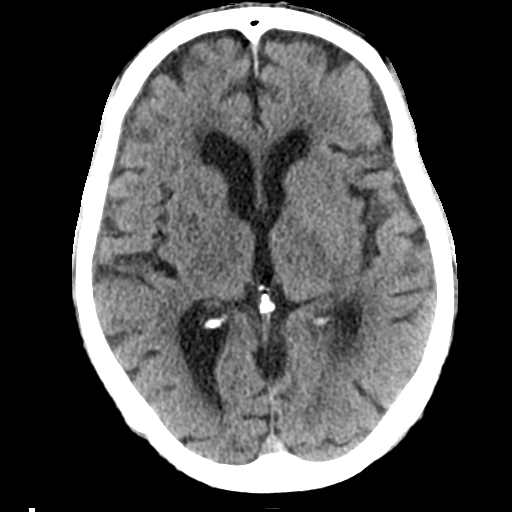
[im 18/33  brain]
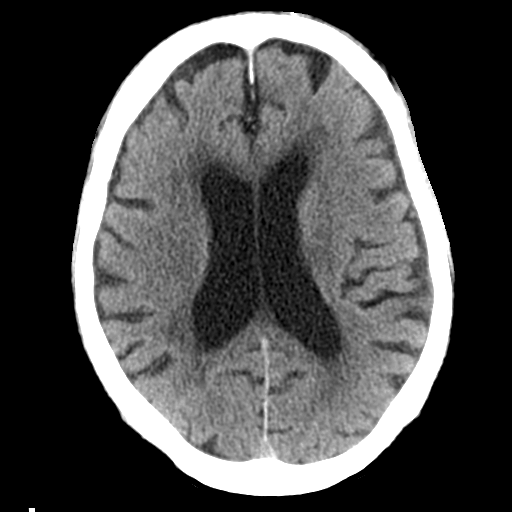
[im 18/33  bone]
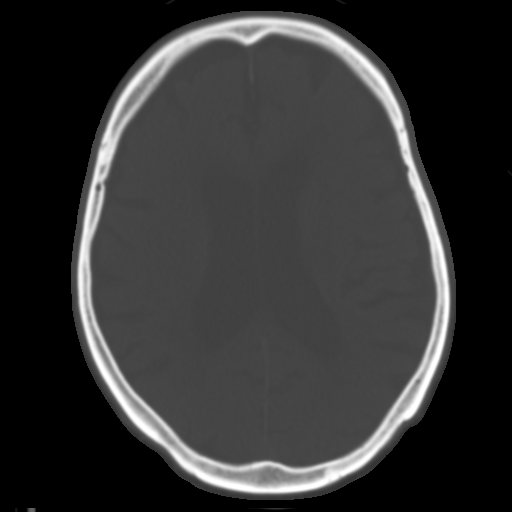
[im 23/33  brain]
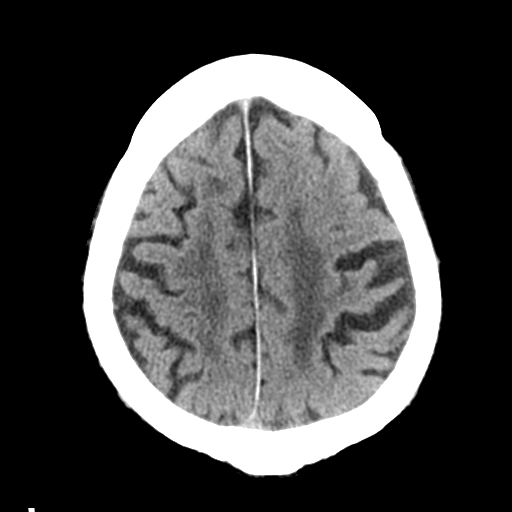
[im 26/33  brain]
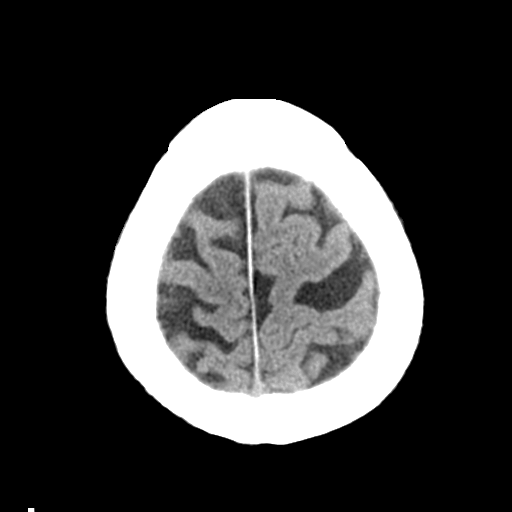
[im 30/33  brain]
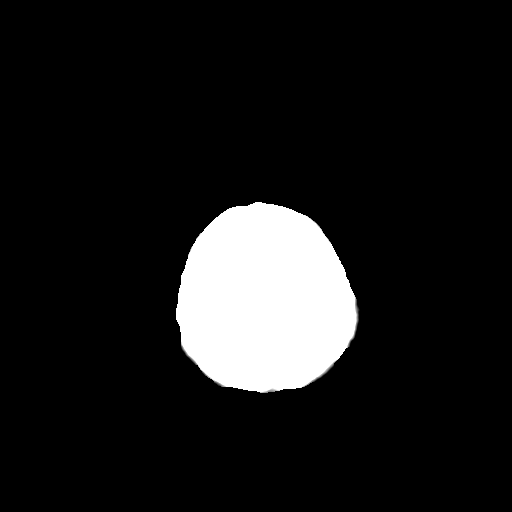

[Series 4: coronal soft · coronal · 0.34mm/px · 3 of 72 slices shown]
[im 24/72  brain]
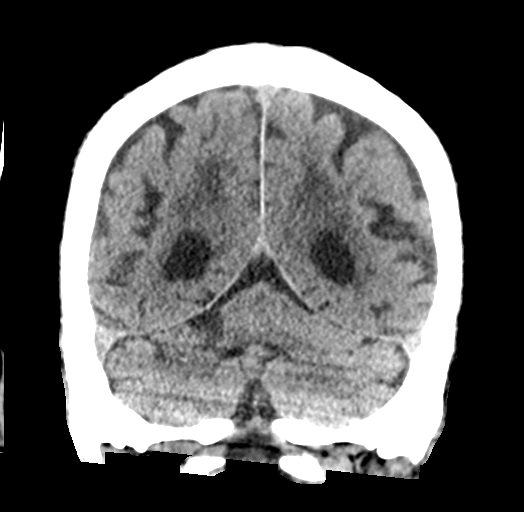
[im 32/72  brain]
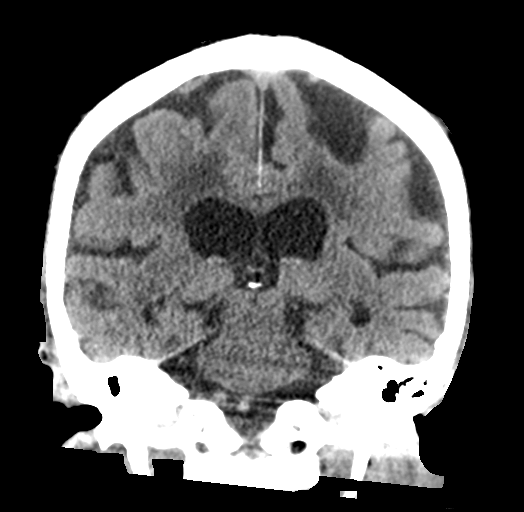
[im 40/72  brain]
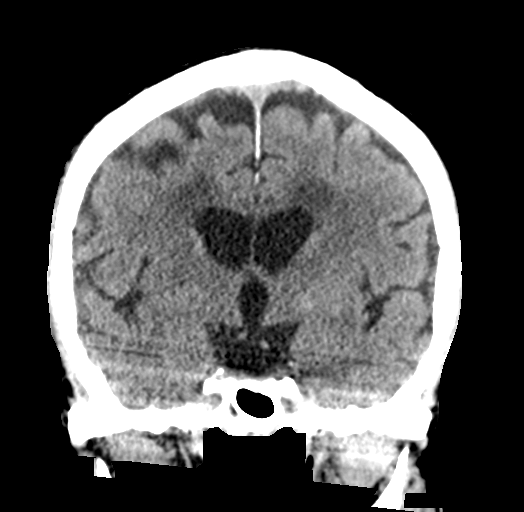

[Series 5: sagittal soft · sagittal · 0.34mm/px · 3 of 59 slices shown]
[im 20/59  brain]
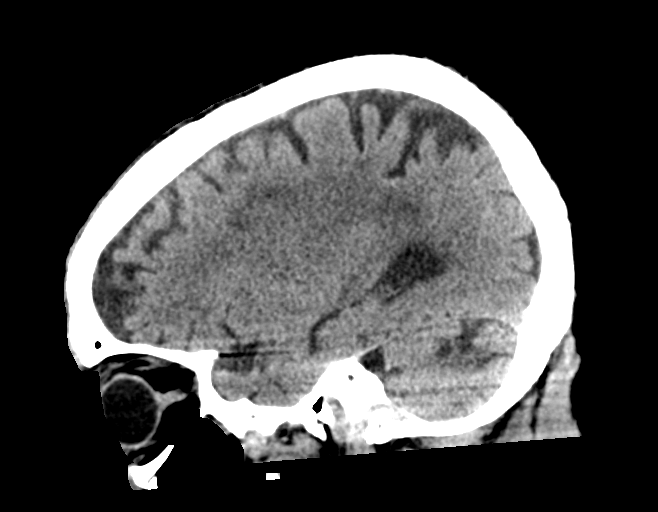
[im 30/59  brain]
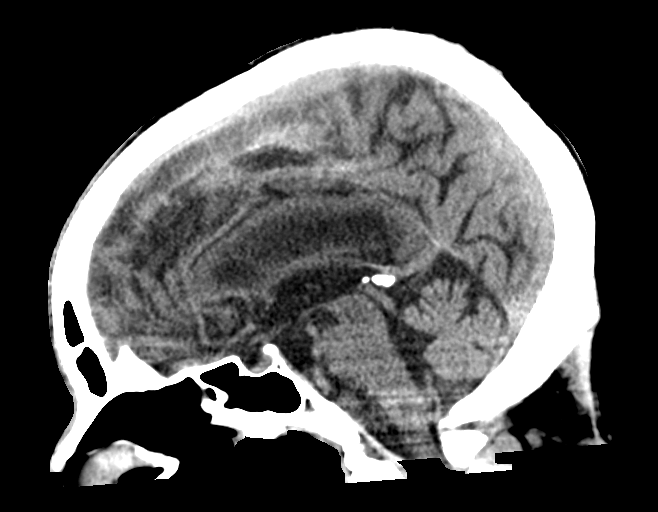
[im 39/59  brain]
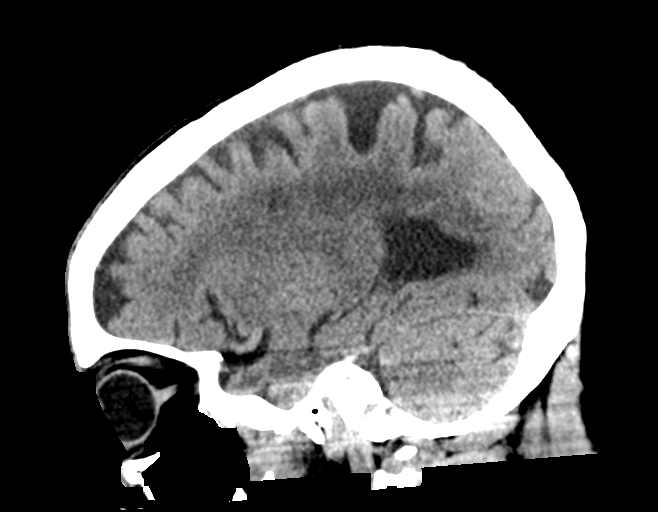

[14 of 47 positions shown; findings below may reference images not displayed]

FINDINGS: CT HEAD

Brain: There is no acute intracranial hemorrhage, mass effect, or
edema. No new loss of gray-white differentiation. Age-indeterminate
small vessel infarct of the left thalamus not definitely present on
more remote studies. Chronic bilateral cerebellar infarcts are again
seen. Patchy and confluent areas of hypoattenuation in the
supratentorial white matter likely reflects stable chronic
microvascular ischemic changes. There is no extra-axial fluid
collection. Ventricles and sulci are stable in size and
configuration.

ASPECTS (Alberta Stroke Program Early CT Score)

- Ganglionic level infarction (caudate, lentiform nuclei, internal
capsule, insula, M1-M3 cortex): 7

- Supraganglionic infarction (M4-M6 cortex): 3

Total score (0-10 with 10 being normal): 10

Vascular: No hyperdense vessel.There is atherosclerotic
calcification at the skull base.

Skull: Calvarium is unremarkable.

Sinuses/Orbits: No acute finding.

Other: None.

Review of the MIP images confirms the above findings

CTA NECK

Aortic arch: Great vessel origins are patent.

Right carotid system: Patent. Trace calcified plaque at the ICA
origin. No measurable stenosis.

Left carotid system: Patent. No measurable stenosis at the ICA
origin.

Vertebral arteries: Patent.

Skeleton: Advanced degenerative changes of the cervical spine.

Other neck: No mass or adenopathy.

Upper chest: No apical lung mass.

Review of the MIP images confirms the above findings

CTA HEAD

Anterior circulation: Intracranial internal carotid arteries are
patent with calcified plaque but no significant stenosis. Anterior
and middle cerebral arteries are patent.

Posterior circulation: Intracranial vertebral arteries are patent.
Patent PICA origins. Right vertebral artery becomes diminutive after
PICA origin. Basilar artery is patent. There is focal marked
stenosis at the level of the mid basilar. Small caliber distal
basilar due to fetal or near fetal origin of both posterior cerebral
arteries. Moderate stenosis of the proximal right P2 PCA. Mild,
probably atherosclerotic irregularity of the left posterior
communicating artery.

Venous sinuses: Patent as allowed by contrast bolus timing.

Review of the MIP images confirms the above findings
IMPRESSION: No acute intracranial hemorrhage or evidence of acute infarction.
Possible age-indeterminate small left thalamic infarct.

No large vessel occlusion or hemodynamically significant stenosis in
the neck.

No proximal intracranial vessel occlusion. Short segment marked
stenosis of the mid basilar. Moderate stenosis proximal right P2
PCA.

These results were communicated to Dr. Ou At [DATE] on
09/01/2020 by text page via the AMION messaging system.

## 2022-07-06 IMAGING — CT CT ANGIO HEAD-NECK
3 of 7 series · 10 of 36 positions shown · IV contrast (Omnipaque or Isovue)
Comparison: CT head 08/31/2020

CLINICAL DATA: Code stroke.  Right arm drift, numbness and tingling

EXAM:
CT HEAD WITHOUT CONTRAST
CT ANGIOGRAPHY OF THE HEAD AND NECK
TECHNIQUE: Contiguous axial images were obtained from the base of the skull
through the vertex without intravenous contrast. Multidetector CT
imaging of the head and neck was performed using the standard
protocol during bolus administration of intravenous contrast.
Multiplanar CT image reconstructions and MIPs were obtained to
evaluate the vascular anatomy. Carotid stenosis measurements (when
applicable) are obtained utilizing NASCET criteria, using the distal
internal carotid diameter as the denominator.
CONTRAST:  75mL OMNIPAQUE IOHEXOL 350 MG/ML SOLN

[Series 4: cta head & neck · axial · 0.53mm/px · z∈[-32,+76]mm · 2 of 163 slices shown]
[im 55/163  soft-tissue]
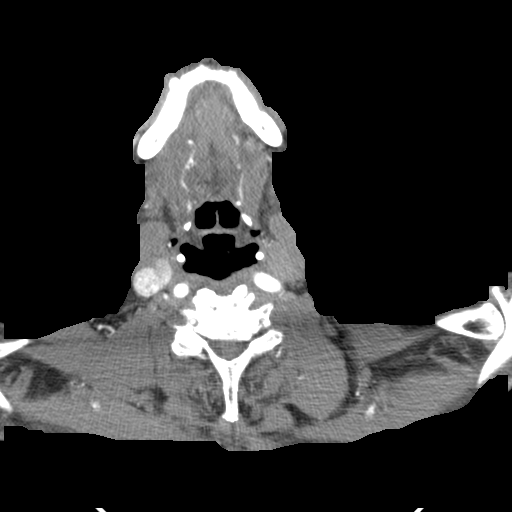
[im 109/163  soft-tissue]
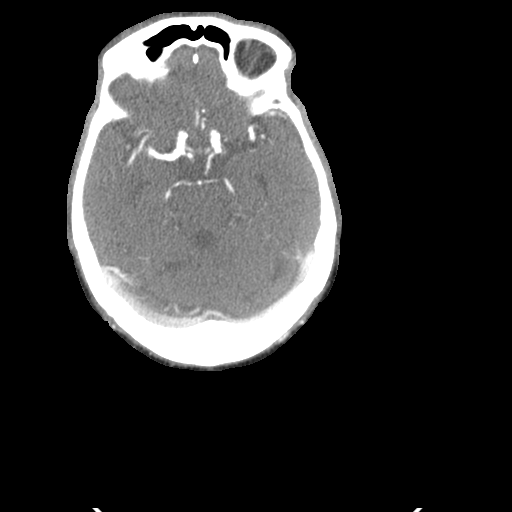

[Series 6: ax thins · axial · 0.47mm/px · z∈[-117,+123]mm · 6 of 339 slices shown]
[im 49/339  soft-tissue]
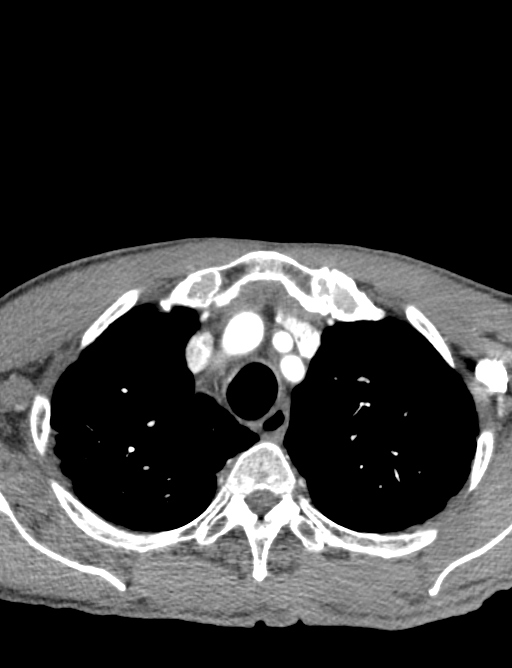
[im 97/339  bone]
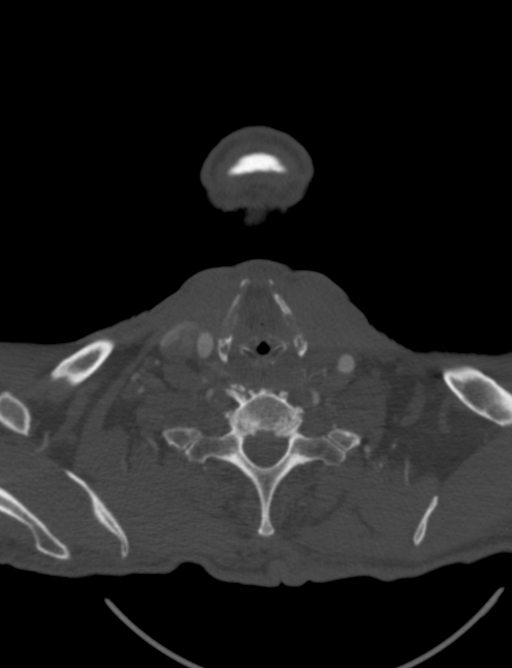
[im 145/339  soft-tissue]
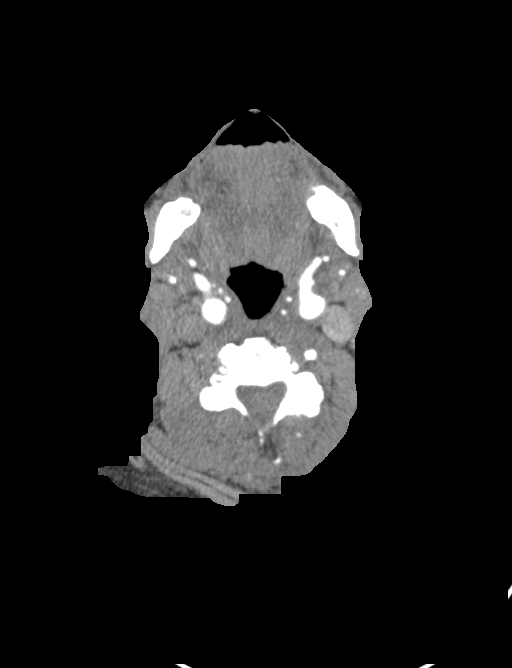
[im 194/339  bone]
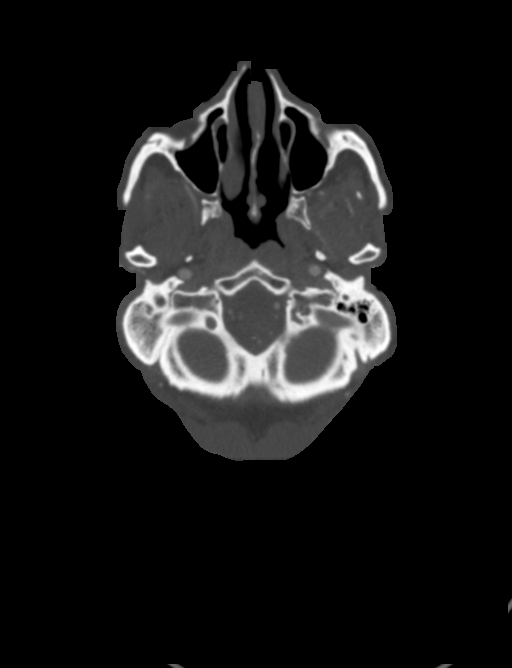
[im 242/339  soft-tissue]
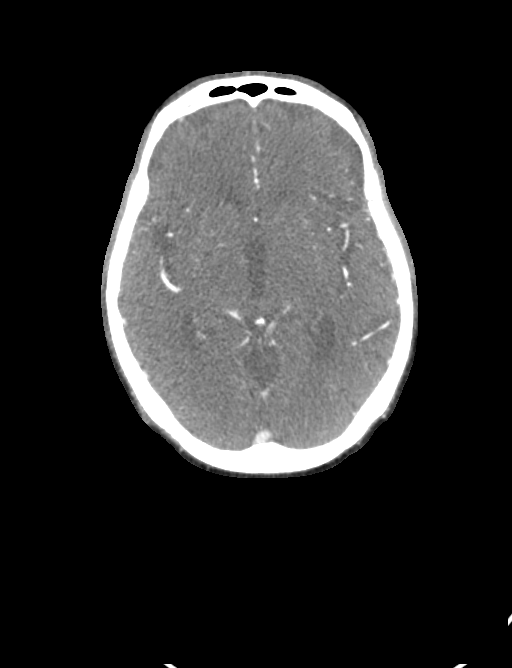
[im 290/339  bone]
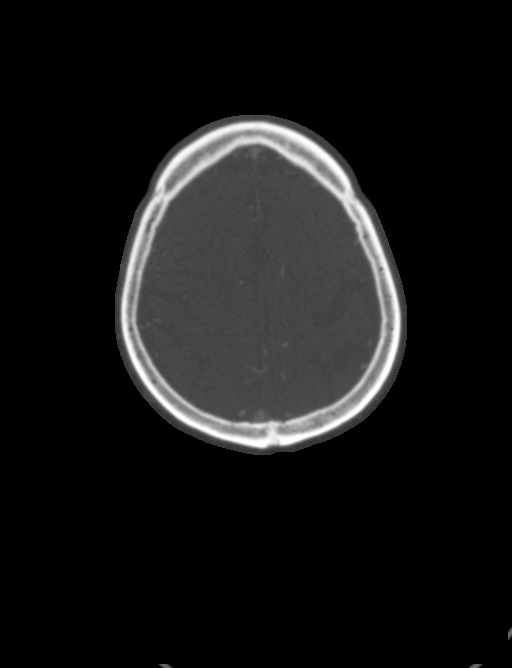

[Series 8: sag thin · sagittal · 0.58mm/px · 2 of 203 slices shown]
[im 23/203  soft-tissue]
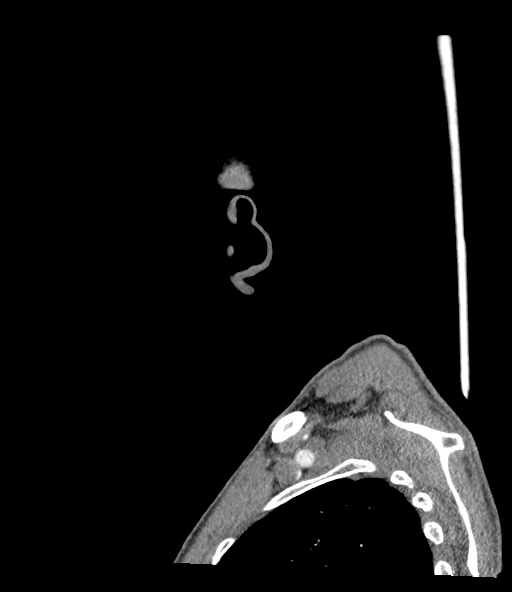
[im 180/203  soft-tissue]
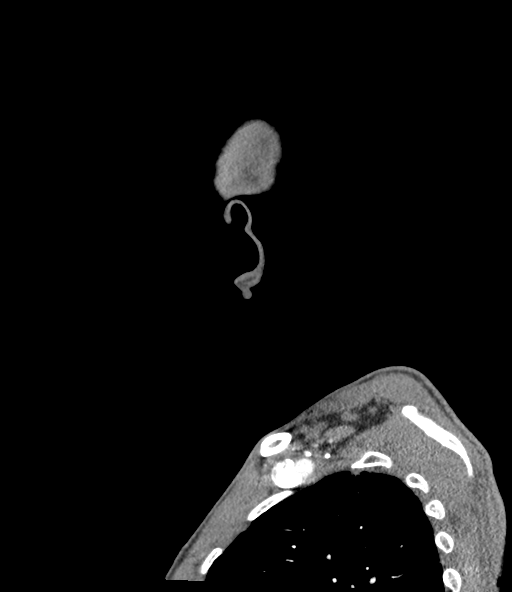

[10 of 36 positions shown; findings below may reference images not displayed]

FINDINGS: CT HEAD

Brain: There is no acute intracranial hemorrhage, mass effect, or
edema. No new loss of gray-white differentiation. Age-indeterminate
small vessel infarct of the left thalamus not definitely present on
more remote studies. Chronic bilateral cerebellar infarcts are again
seen. Patchy and confluent areas of hypoattenuation in the
supratentorial white matter likely reflects stable chronic
microvascular ischemic changes. There is no extra-axial fluid
collection. Ventricles and sulci are stable in size and
configuration.

ASPECTS (Alberta Stroke Program Early CT Score)

- Ganglionic level infarction (caudate, lentiform nuclei, internal
capsule, insula, M1-M3 cortex): 7

- Supraganglionic infarction (M4-M6 cortex): 3

Total score (0-10 with 10 being normal): 10

Vascular: No hyperdense vessel.There is atherosclerotic
calcification at the skull base.

Skull: Calvarium is unremarkable.

Sinuses/Orbits: No acute finding.

Other: None.

Review of the MIP images confirms the above findings

CTA NECK

Aortic arch: Great vessel origins are patent.

Right carotid system: Patent. Trace calcified plaque at the ICA
origin. No measurable stenosis.

Left carotid system: Patent. No measurable stenosis at the ICA
origin.

Vertebral arteries: Patent.

Skeleton: Advanced degenerative changes of the cervical spine.

Other neck: No mass or adenopathy.

Upper chest: No apical lung mass.

Review of the MIP images confirms the above findings

CTA HEAD

Anterior circulation: Intracranial internal carotid arteries are
patent with calcified plaque but no significant stenosis. Anterior
and middle cerebral arteries are patent.

Posterior circulation: Intracranial vertebral arteries are patent.
Patent PICA origins. Right vertebral artery becomes diminutive after
PICA origin. Basilar artery is patent. There is focal marked
stenosis at the level of the mid basilar. Small caliber distal
basilar due to fetal or near fetal origin of both posterior cerebral
arteries. Moderate stenosis of the proximal right P2 PCA. Mild,
probably atherosclerotic irregularity of the left posterior
communicating artery.

Venous sinuses: Patent as allowed by contrast bolus timing.

Review of the MIP images confirms the above findings
IMPRESSION: No acute intracranial hemorrhage or evidence of acute infarction.
Possible age-indeterminate small left thalamic infarct.

No large vessel occlusion or hemodynamically significant stenosis in
the neck.

No proximal intracranial vessel occlusion. Short segment marked
stenosis of the mid basilar. Moderate stenosis proximal right P2
PCA.

These results were communicated to Dr. Ou At [DATE] on
09/01/2020 by text page via the [employer] messaging system.
